# Patient Record
Sex: Male | Born: 1935 | Race: White | Hispanic: No | Smoking: Former smoker
Health system: Southern US, Community
[De-identification: ages and names within clinical notes are randomized; demographics above are authoritative.]

## PROBLEM LIST (undated history)

## (undated) DIAGNOSIS — R262 Difficulty in walking, not elsewhere classified: Secondary | ICD-10-CM

## (undated) DIAGNOSIS — I482 Chronic atrial fibrillation, unspecified: Secondary | ICD-10-CM

## (undated) DIAGNOSIS — G5600 Carpal tunnel syndrome, unspecified upper limb: Secondary | ICD-10-CM

## (undated) DIAGNOSIS — G459 Transient cerebral ischemic attack, unspecified: Secondary | ICD-10-CM

## (undated) DIAGNOSIS — N400 Enlarged prostate without lower urinary tract symptoms: Secondary | ICD-10-CM

## (undated) DIAGNOSIS — N189 Chronic kidney disease, unspecified: Secondary | ICD-10-CM

## (undated) DIAGNOSIS — I639 Cerebral infarction, unspecified: Secondary | ICD-10-CM

## (undated) DIAGNOSIS — Z66 Do not resuscitate: Secondary | ICD-10-CM

## (undated) DIAGNOSIS — M5030 Other cervical disc degeneration, unspecified cervical region: Secondary | ICD-10-CM

## (undated) DIAGNOSIS — R531 Weakness: Secondary | ICD-10-CM

## (undated) DIAGNOSIS — G35 Multiple sclerosis: Secondary | ICD-10-CM

## (undated) DIAGNOSIS — I499 Cardiac arrhythmia, unspecified: Secondary | ICD-10-CM

## (undated) DIAGNOSIS — M48061 Spinal stenosis, lumbar region without neurogenic claudication: Secondary | ICD-10-CM

## (undated) HISTORY — PX: INGUINAL HERNIA REPAIR: SUR1180

## (undated) HISTORY — DX: Carpal tunnel syndrome, unspecified upper limb: G56.00

## (undated) HISTORY — PX: CATARACT EXTRACTION W/ INTRAOCULAR LENS  IMPLANT, BILATERAL: SHX1307

## (undated) HISTORY — PX: PROCTOSIGMOIDOSCOPY: SUR1052

## (undated) HISTORY — DX: Transient cerebral ischemic attack, unspecified: G45.9

## (undated) HISTORY — DX: Chronic atrial fibrillation, unspecified: I48.20

---

## 2000-07-12 ENCOUNTER — Ambulatory Visit (HOSPITAL_COMMUNITY): Admission: RE | Admit: 2000-07-12 | Discharge: 2000-07-12 | Payer: Self-pay | Admitting: Gastroenterology

## 2003-07-17 ENCOUNTER — Encounter: Admission: RE | Admit: 2003-07-17 | Discharge: 2003-07-17 | Payer: Self-pay | Admitting: Cardiology

## 2003-07-18 ENCOUNTER — Ambulatory Visit (HOSPITAL_COMMUNITY): Admission: RE | Admit: 2003-07-18 | Discharge: 2003-07-18 | Payer: Self-pay | Admitting: Cardiology

## 2012-08-22 ENCOUNTER — Observation Stay (HOSPITAL_COMMUNITY)
Admission: EM | Admit: 2012-08-22 | Discharge: 2012-08-26 | Disposition: A | Discharge status: Home / Self Care | Payer: Medicare Other | Attending: Internal Medicine | Admitting: Internal Medicine

## 2012-08-22 ENCOUNTER — Emergency Department (HOSPITAL_COMMUNITY): Payer: Medicare Other

## 2012-08-22 DIAGNOSIS — R748 Abnormal levels of other serum enzymes: Secondary | ICD-10-CM | POA: Insufficient documentation

## 2012-08-22 DIAGNOSIS — R079 Chest pain, unspecified: Secondary | ICD-10-CM

## 2012-08-22 DIAGNOSIS — R29898 Other symptoms and signs involving the musculoskeletal system: Secondary | ICD-10-CM | POA: Insufficient documentation

## 2012-08-22 DIAGNOSIS — I639 Cerebral infarction, unspecified: Secondary | ICD-10-CM

## 2012-08-22 DIAGNOSIS — I4891 Unspecified atrial fibrillation: Secondary | ICD-10-CM

## 2012-08-22 DIAGNOSIS — G8190 Hemiplegia, unspecified affecting unspecified side: Secondary | ICD-10-CM | POA: Insufficient documentation

## 2012-08-22 DIAGNOSIS — G459 Transient cerebral ischemic attack, unspecified: Principal | ICD-10-CM | POA: Insufficient documentation

## 2012-08-22 DIAGNOSIS — I672 Cerebral atherosclerosis: Secondary | ICD-10-CM | POA: Insufficient documentation

## 2012-08-22 DIAGNOSIS — I482 Chronic atrial fibrillation, unspecified: Secondary | ICD-10-CM | POA: Diagnosis present

## 2012-08-22 HISTORY — DX: Cerebral infarction, unspecified: I63.9

## 2012-08-22 LAB — RAPID URINE DRUG SCREEN, HOSP PERFORMED
Amphetamines: NOT DETECTED
Barbiturates: NOT DETECTED
Benzodiazepines: NOT DETECTED
Cocaine: NOT DETECTED
Opiates: NOT DETECTED
Tetrahydrocannabinol: NOT DETECTED

## 2012-08-22 LAB — POCT I-STAT, CHEM 8
BUN: 25 mg/dL — ABNORMAL HIGH (ref 6–23)
Calcium, Ion: 1.08 mmol/L — ABNORMAL LOW (ref 1.13–1.30)
Chloride: 108 mEq/L (ref 96–112)
Glucose, Bld: 92 mg/dL (ref 70–99)
TCO2: 20 mmol/L (ref 0–100)

## 2012-08-22 LAB — DIGOXIN LEVEL: Digoxin Level: 0.8 ng/mL (ref 0.8–2.0)

## 2012-08-22 MED ORDER — METOPROLOL TARTRATE 1 MG/ML IV SOLN
5.0000 mg | Freq: Once | INTRAVENOUS | Status: AC
  Administered 2012-08-22: 5 mg via INTRAVENOUS

## 2012-08-22 MED ORDER — METOPROLOL TARTRATE 1 MG/ML IV SOLN
INTRAVENOUS | Status: AC
  Administered 2012-08-22: 5 mg via INTRAVENOUS
  Filled 2012-08-22: qty 5

## 2012-08-22 NOTE — ED Notes (Signed)
Code stroke cancelled per neurologist 

## 2012-08-22 NOTE — Consult Note (Signed)
Referring Physician: Dr. Laurice Record    Chief Complaint: Left-sided weakness  HPI: Arthur Speagle is an 78 y.o. male with a history of atrial fibrillation, not on anticoagulation, presenting with acute weakness involving left extremities. Onset of symptoms was at 5 PM today. Has no previous documentation of stroke nor TIA. CT scan showed an area of hypoattenuation involving the left frontal region indicative of probable old stroke. There were no acute changes. Patient's deficits improved in route to the emergency room. NIH stroke score following arrival was 2. He was noted TPA candidate because of rapidly resolving deficits and patient was beyond time window for treatment consideration with intravenous thrombolytic therapy. He's been taking aspirin 81 mg per day.  LSN: 5 PM today tPA Given: No: Rapidly resolving deficits and beyond time window for treatment consideration MRankin: 1  No past medical history on file.  No family history on file.   Medications:  Prior to Admission: Digoxin 0.125 mg per day Metoprolol 12.5 mg per day Aspirin 81 mg per day  Physical Examination: Blood pressure 109/58, pulse 100, temperature 98.4 F (36.9 C), temperature source Oral, resp. rate 20, SpO2 95.00%.  Neurologic Examination: Mental Status: Alert, oriented, thought content appropriate.  Speech fluent without evidence of aphasia. Able to follow commands without difficulty. Cranial Nerves: II-Visual fields were normal. III/IV/VI-Pupils were equal and reacted. Extraocular movements were full and conjugate.    V/VII-no facial numbness and no facial weakness. VIII-normal. X-normal speech and symmetrical palatal movement. Motor: Moderate proximal and distal weakness of the left lower extremity with flaccid muscle tone (chronic by history); moderately severe weakness of hand grip and with flexion contractures of digits (also chronic); normal motor exam otherwise.  Sensory: Normal throughout. Deep Tendon  Reflexes: 1+ and symmetric. Plantars: Mute bilaterally Cerebellar: Normal finger-to-nose testing. Carotid auscultation: Normal   Ct Head Wo Contrast  08/22/2012  *RADIOLOGY REPORT*  Clinical Data: History of fall and severe weakness on the left side.  Code stroke.  CT HEAD WITHOUT CONTRAST  Technique:  Contiguous axial images were obtained from the base of the skull through the vertex without contrast.  Comparison: No priors.  Findings: Focal area of low attenuation in the left frontal periventricular white matter (images 21 and 22 of series 2). Focus of low attenuation in the left putamen is compatible with an old lacunar infarction.  A similar finding is present in the inferior aspect of the right putamen.  Physiologic calcifications of the basal ganglia bilaterally.  Mild cerebral and cerebellar atrophy. No definite evidence of acute intracranial hemorrhage, mass, mass effect, hydrocephalus or abnormal intra or extra-axial fluid collections.  Visualized paranasal sinuses and mastoids are well pneumatized.  No acute displaced skull fractures are noted.  IMPRESSION: 1.  Slightly ill-defined focus of low attenuation in the left frontal lobe periventricular white matter could represent an area of age indeterminate ischemia. 2.  Mild cerebral and cerebellar atrophy with old bilateral basal ganglia lacunar infarctions.  These results were called by telephone on 08/22/2012 at 08:50 p.m. to Dr. Roseanne Reno, who verbally acknowledged these results.   Original Report Authenticated By: Trudie Reed, M.D.     Assessment: 78 y.o. male with known atrial fibrillation and not on anticoagulation, presenting with probable right subcortical MCA territory TIA. Acute small vessel stroke cannot be ruled out.  Stroke Risk Factors - atrial fibrillation  Plan: 1. HgbA1c, fasting lipid panel 2. MRI, MRA  of the brain without contrast 3. PT consult, OT consult, Speech consult 4. Echocardiogram 5. Carotid  dopplers 6.  Prophylactic therapy-Antiplatelet med: Plavix 75 mg per day 7. Risk factor modification 8. Telemetry monitoring   C.R. Roseanne Reno, MD [facility] [phone]  08/22/2012, 9:14 PM
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
## 2012-08-22 NOTE — ED Notes (Signed)
Placed on ccm showing atrial fib rate 120-156; EDP made aware of same - orders given; pt c/o interm pain to left chest - none at this time - describes pain as "a little bit of pain and dull"

## 2012-08-23 ENCOUNTER — Encounter (HOSPITAL_COMMUNITY): Payer: Self-pay | Admitting: Internal Medicine

## 2012-08-23 ENCOUNTER — Observation Stay (HOSPITAL_COMMUNITY): Payer: Medicare Other

## 2012-08-23 DIAGNOSIS — I4891 Unspecified atrial fibrillation: Secondary | ICD-10-CM

## 2012-08-23 DIAGNOSIS — R0989 Other specified symptoms and signs involving the circulatory and respiratory systems: Secondary | ICD-10-CM

## 2012-08-23 DIAGNOSIS — I6350 Cerebral infarction due to unspecified occlusion or stenosis of unspecified cerebral artery: Secondary | ICD-10-CM

## 2012-08-23 DIAGNOSIS — G459 Transient cerebral ischemic attack, unspecified: Secondary | ICD-10-CM

## 2012-08-23 LAB — CBC WITH DIFFERENTIAL/PLATELET
Basophils Absolute: 0 10*3/uL (ref 0.0–0.1)
Basophils Relative: 0 % (ref 0–1)
Eosinophils Absolute: 0 10*3/uL (ref 0.0–0.7)
Eosinophils Relative: 0 % (ref 0–5)
HCT: 36.3 % — ABNORMAL LOW (ref 39.0–52.0)
MCH: 38.1 pg — ABNORMAL HIGH (ref 26.0–34.0)
MCHC: 35.5 g/dL (ref 30.0–36.0)
MCV: 107.1 fL — ABNORMAL HIGH (ref 78.0–100.0)
Monocytes Absolute: 1.8 10*3/uL — ABNORMAL HIGH (ref 0.1–1.0)
RDW: 13.7 % (ref 11.5–15.5)

## 2012-08-23 LAB — CK TOTAL AND CKMB (NOT AT ARMC): Relative Index: 1.2 (ref 0.0–2.5)

## 2012-08-23 LAB — LIPID PANEL
Cholesterol: 136 mg/dL (ref 0–200)
LDL Cholesterol: 74 mg/dL (ref 0–99)
Total CHOL/HDL Ratio: 3 RATIO
Triglycerides: 55 mg/dL (ref <150)
VLDL: 11 mg/dL (ref 0–40)

## 2012-08-23 LAB — COMPREHENSIVE METABOLIC PANEL
AST: 30 U/L (ref 0–37)
Albumin: 3.1 g/dL — ABNORMAL LOW (ref 3.5–5.2)
CO2: 24 mEq/L (ref 19–32)
Calcium: 9.2 mg/dL (ref 8.4–10.5)
Creatinine, Ser: 0.86 mg/dL (ref 0.50–1.35)
GFR calc non Af Amer: 76 mL/min — ABNORMAL LOW (ref >90)
Total Protein: 6.7 g/dL (ref 6.0–8.3)

## 2012-08-23 LAB — APTT: aPTT: 37 seconds (ref 24–37)

## 2012-08-23 LAB — TROPONIN I
Troponin I: 0.42 ng/mL (ref <0.30)
Troponin I: <0.3 ng/mL (ref <0.30)
Troponin I: 0.35 ng/mL (ref <0.30)

## 2012-08-23 LAB — POCT I-STAT TROPONIN I

## 2012-08-23 LAB — PROTIME-INR: INR: 1.1 (ref 0.00–1.49)

## 2012-08-23 MED ORDER — HEPARIN SODIUM (PORCINE) 5000 UNIT/ML IJ SOLN
5000.0000 [IU] | Freq: Three times a day (TID) | INTRAMUSCULAR | Status: DC
  Administered 2012-08-23 – 2012-08-24 (×4): 5000 [IU] via SUBCUTANEOUS
  Filled 2012-08-23 (×8): qty 1

## 2012-08-23 MED ORDER — DIGOXIN 125 MCG PO TABS
0.1250 mg | ORAL_TABLET | Freq: Every day | ORAL | Status: DC
  Administered 2012-08-23 – 2012-08-26 (×4): 0.125 mg via ORAL
  Filled 2012-08-23 (×4): qty 1

## 2012-08-23 MED ORDER — METOPROLOL TARTRATE 12.5 MG HALF TABLET
12.5000 mg | ORAL_TABLET | Freq: Two times a day (BID) | ORAL | Status: DC
  Administered 2012-08-23 (×3): 12.5 mg via ORAL
  Filled 2012-08-23 (×5): qty 1

## 2012-08-23 MED ORDER — CLOPIDOGREL BISULFATE 75 MG PO TABS
75.0000 mg | ORAL_TABLET | Freq: Every day | ORAL | Status: DC
  Administered 2012-08-23 – 2012-08-24 (×2): 75 mg via ORAL
  Filled 2012-08-23 (×3): qty 1

## 2012-08-23 MED ORDER — ASPIRIN 81 MG PO CHEW
81.0000 mg | CHEWABLE_TABLET | Freq: Every day | ORAL | Status: DC
  Administered 2012-08-23 – 2012-08-24 (×2): 81 mg via ORAL
  Filled 2012-08-23 (×2): qty 1

## 2012-08-23 NOTE — Evaluation (Signed)
Speech Language Pathology Evaluation Patient Details Name: Erik Arnold MRN: [id number] DOB: 1924/07/15 Today's Date: 08/23/2012 Time: 1045-1100 SLP Time Calculation (min): 15 min  Problem List:  Patient Active Problem List  Diagnosis  . TIA (transient ischemic attack)  . Atrial fibrillation  . Cardiac enzymes elevated  . Chest pain   Past Medical History:  Past Medical History  Diagnosis Date  . AF (atrial fibrillation)     Not on coumadin as he was deemed a fall risk after he fell in his cardiologists office.   Past Surgical History: No past surgical history on file. HPI:   78 y.o. male with known atrial fibrillation and not on anticoagulation, presenting with probable right subcortical MCA territory TIA. Acute small vessel stroke cannot be ruled out.   Assessment / Plan / Recommendation Clinical Impression  Cognitive-linguistic evaluation revealed normal function with no SLP intervention required.  Pt agrees with results/recs.    SLP Assessment  Patient does not need any further Speech Language Pathology Services    Follow Up Recommendations  None    Frequency and Duration   n/a     Pertinent Vitals/Pain No pain   SLP Goals     SLP Evaluation Prior Functioning  Cognitive/Linguistic Baseline: Within functional limits Type of Home: House Lives With: Alone Vocation: Retired (from [employer])   Cognition  Overall Cognitive Status: Appears within functional limits for tasks assessed Arousal/Alertness: Awake/alert Orientation Level: Oriented X4    Comprehension  Auditory Comprehension Overall Auditory Comprehension: Appears within functional limits for tasks assessed Visual Recognition/Discrimination Discrimination: Within Function Limits Reading Comprehension Reading Status: Within funtional limits    Expression Expression Primary Mode of Expression: Verbal Verbal Expression Overall Verbal Expression: Appears within functional limits for tasks assessed    Oral / Motor Oral Motor/Sensory Function Overall Oral Motor/Sensory Function: Appears within functional limits for tasks assessed Motor Speech Overall Motor Speech: Appears within functional limits for tasks assessed   GO Functional Assessment Tool Used: clinical judgement Functional Limitations: Spoken language comprehension Spoken Language Comprehension Current Status [phone]): 0 percent impaired, limited or restricted Spoken Language Comprehension Goal Status (G9160): 0 percent impaired, limited or restricted Spoken Language Comprehension Discharge Status [phone]): 0 percent impaired, limited or restricted  Erik Arnold, [place] CCC/SLP Pager [phone]   [name] 08/23/2012, 11:03 AM

## 2012-08-23 NOTE — Progress Notes (Signed)
UR completed 

## 2012-08-23 NOTE — ED Notes (Signed)
Notified EDP of critical lab values.  Admitting doctor also paged, have not received a page back.

## 2012-08-23 NOTE — Evaluation (Signed)
Occupational Therapy Evaluation Patient Details Name: Mark Hassey MRN: [id number] DOB: 1924-08-20 Today's Date: 08/23/2012 Time: 4098-1191 OT Time Calculation (min): 18 min  OT Assessment / Plan / Recommendation Clinical Impression  This 78 yo male admitted s/p fall with left sided weakness with CT indeterminate (awaiting MRI) presents to acute OT with problems below. Will benefit from acute OT with follow OT at SNF.    OT Assessment  Patient needs continued OT Services    Follow Up Recommendations  SNF    Barriers to Discharge Decreased caregiver support    Equipment Recommendations  3 in 1 bedside comode       Frequency  Min 3X/week    Precautions / Restrictions Precautions Precautions: Fall Restrictions Weight Bearing Restrictions: No       ADL  Eating/Feeding: Simulated;Set up Where Assessed - Eating/Feeding: Chair Grooming: Simulated;Minimal assistance Where Assessed - Grooming: Unsupported sitting Upper Body Bathing: Simulated;Minimal assistance Where Assessed - Upper Body Bathing: Unsupported sitting Lower Body Bathing: Simulated;Moderate assistance Where Assessed - Lower Body Bathing: Supported sit to stand Upper Body Dressing: Simulated;Moderate assistance Where Assessed - Upper Body Dressing: Unsupported sitting Lower Body Dressing: Simulated;Maximal assistance Where Assessed - Lower Body Dressing: Supported sit to stand Toilet Transfer: Simulated;Minimal assistance Toilet Transfer Method: Sit to Barista:  (Bed ambulating 5 feet to recliner) Toileting - Clothing Manipulation and Hygiene: Simulated;Minimal assistance Where Assessed - Engineer, mining and Hygiene: Standing Equipment Used: Rolling walker;Gait belt Transfers/Ambulation Related to ADLs: Min A for all    OT Diagnosis: Generalized weakness;Hemiplegia non-dominant side  OT Problem List: Decreased strength;Decreased range of motion;Impaired balance (sitting  and/or standing);Decreased coordination;Decreased knowledge of use of DME or AE;Impaired UE functional use OT Treatment Interventions: Self-care/ADL training;DME and/or AE instruction;Patient/family education;Balance training;Therapeutic exercise;Therapeutic activities   OT Goals Acute Rehab OT Goals OT Goal Formulation: With patient Time For Goal Achievement: 09/06/12 Potential to Achieve Goals: Good ADL Goals Pt Will Perform Grooming: Unsupported;with set-up;Standing at sink (min guard A, 1 task, using LUE as a gross A) ADL Goal: Grooming - Progress: Goal set today Pt Will Perform Upper Body Bathing: with set-up;with supervision;Unsupported;Sitting, edge of bed;Sitting, chair ADL Goal: Upper Body Bathing - Progress: Goal set today Pt Will Perform Lower Body Bathing: with min assist;Unsupported;Sit to stand from bed;Sit to stand from chair ADL Goal: Lower Body Bathing - Progress: Goal set today Pt Will Perform Upper Body Dressing: with min assist;Unsupported;Sit to stand from bed;Sit to stand from chair ADL Goal: Upper Body Dressing - Progress: Goal set today Pt Will Perform Lower Body Dressing: with mod assist;Unsupported;Sit to stand from bed;Sit to stand from chair ADL Goal: Lower Body Dressing - Progress: Goal set today Pt Will Transfer to Toilet: Ambulation;with DME;Comfort height toilet;Grab bars (min guard A) ADL Goal: Toilet Transfer - Progress: Goal set today Pt Will Perform Toileting - Clothing Manipulation: Standing (min guard A) ADL Goal: Toileting - Clothing Manipulation - Progress: Goal set today Pt Will Perform Toileting - Hygiene: Sit to stand from 3-in-1/toilet (min guard A) ADL Goal: Toileting - Hygiene - Progress: Goal set today Miscellaneous OT Goals Miscellaneous OT Goal #1: Pt will be Supervision for LUE GM/FM activities and exercises. OT Goal: Miscellaneous Goal #1 - Progress: Goal set today  Visit Information  Last OT Received On: 08/23/12 Assistance Needed:  +1 PT/OT Co-Evaluation/Treatment: Yes    Subjective Data  Subjective: I think the therapy would be good and if I like the place I may just stay there.  Prior Functioning     Home Living Lives With: Alone Available Help at Discharge: Family;Available PRN/intermittently Type of Home: House Home Access: Stairs to enter [employer] of Steps: 1 Entrance Stairs-Rails: None Home Layout: One level Bathroom Shower/Tub: Walk-in shower;Door [employer] Toilet: Handicapped height Bathroom Accessibility: Yes How Accessible: Accessible via walker Home Adaptive Equipment: Hand-held shower hose;Grab bars in shower;Straight cane Prior Function Level of Independence: Independent with assistive device(s) (SPC) Able to Take Stairs?: No Driving: Yes Vocation: Retired Musician: No difficulties Dominant Hand: Right            Cognition  Overall Cognitive Status: Appears within functional limits for tasks assessed/performed Arousal/Alertness: Awake/alert Orientation Level: Appears intact for tasks assessed Behavior During Session: [facility] for tasks performed    Extremity/Trunk Assessment Right Upper Extremity Assessment RUE ROM/Strength/Tone: Deficits RUE ROM/Strength/Tone Deficits: Decreased AROM at shoulder, rest WFL Left Upper Extremity Assessment LUE ROM/Strength/Tone: Deficits LUE ROM/Strength/Tone Deficits: Decreased AROM at shoulder, WFL elbow flexion, decreased strength in triceps for extension. Decreased wrist and digit flex/ext. LUE Coordination: Deficits LUE Coordination Deficits: Both are affected (Fm > GM)     Mobility Bed Mobility Bed Mobility: Supine to Sit;Sitting - Scoot to Edge of Bed Supine to Sit: 5: Supervision;HOB flat Sitting - Scoot to Edge of Bed: 5: Supervision Details for Bed Mobility Assistance: Increased time Transfers Transfers: Sit to Stand;Stand to Sit Sit to Stand: 4: Min assist;With upper extremity assist;From bed Stand  to Sit: 4: Min assist;With upper extremity assist;With armrests;To chair/3-in-1 Details for Transfer Assistance: VCs for safe hand placement              End of Session OT - End of Session Equipment Utilized During Treatment: Gait belt (RW) Activity Tolerance: Patient tolerated treatment well Patient left: in chair;with call bell/phone within reach Nurse Communication: Mobility status (+1 with RW, need to secure his wallet and meds in room)       Evette Georges [phone] 08/23/2012, 11:55 AM

## 2012-08-23 NOTE — Consult Note (Signed)
Admit date: 08/22/2012 Referring Physician  Dr. Susie Cassette Primary Cardiologist  Dr. Mayford Knife Reason for Consultation  Elevated cardiac enzymes  HPI:  Erik Arnold is a 78 y.o. male w h/o A.Fib not on coumadin due to concern for fall risk (fell in his cardiologist's office), who presented to the ED with weakness located in his LUE and LLE. Symptoms onset at 5 PM, no previous h/o CVA nor TIA. Code stroke was called on arrival. In the ED NIH stroke scale was 2, but the patient showed rapidly resolving deficits and was deemed not a candidate for TPA (was beyond the time window as well). CT scan demonstrated area of hypo attenuation in the L frontal region indicating a probable old stroke but no acute changes. He was admitted and was noted to have elevated cardiac enzymes and we are now asked to consult.  The patient states that he has had left sided chest pain off and on for several weeks that he attributed to indigestion.  He denies any chest pain on the day of admission.  He denies any SOB or LE edema.  CPK and MB were elevated but relative index is normal.  Troponin is mildly elevated.     PMH:   Past Medical History  Diagnosis Date  . AF (atrial fibrillation)     Not on coumadin as he was deemed a fall risk after he fell in his cardiologists office.     PSH:  No past surgical history on file.  Allergies:  Review of patient's allergies indicates no known allergies. Prior to Admit Meds:   Prescriptions prior to admission  Medication Sig Dispense Refill  . digoxin (LANOXIN) 0.125 MG tablet Take 0.125 mg by mouth daily.      . metoprolol tartrate (LOPRESSOR) 25 MG tablet Take 12.5 mg by mouth 2 (two) times daily.       Fam HX:   No family history on file. Social HX:    History   Social History  . Marital Status: Unknown    Spouse Name: N/A    Number of Children: N/A  . Years of Education: N/A   Occupational History  . Not on file.   Social History Main Topics  . Smoking status: Not on file   . Smokeless tobacco: Not on file  . Alcohol Use: Not on file  . Drug Use: Not on file  . Sexually Active: Not on file   Other Topics Concern  . Not on file   Social History Narrative  . No narrative on file     ROS:  All 11 ROS were addressed and are negative except what is stated in the HPI  Physical Exam: Blood pressure 145/42, pulse 102, temperature 98 F (36.7 C), temperature source Oral, resp. rate 20, height 5\' 8"  (1.727 m), weight 76.2 kg (167 lb 15.9 oz), SpO2 99.00%.    General: Well developed, well nourished, in no acute distress Head: Eyes PERRLA, No xanthomas.   Normal cephalic and atramatic  Lungs:   Clear bilaterally to auscultation and percussion. Heart:   HRRR S1 S2 Pulses are 2+ & equal.            No carotid bruit. No JVD.  No abdominal bruits. No femoral bruits. Abdomen: Bowel sounds are positive, abdomen soft and non-tender without masses  Extremities:   No clubbing, cyanosis or edema.  DP +1 Neuro: Alert and oriented X 3. Psych:  Good affect, responds appropriately    Labs:   Lab Results  Component Value Date   WBC 14.6* 08/23/2012   HGB 12.9* 08/23/2012   HCT 36.3* 08/23/2012   MCV 107.1* 08/23/2012   PLT 193 08/23/2012    Lab 08/23/12 0022  NA 133*  K 4.5  CL 100  CO2 24  BUN 24*  CREATININE 0.86  CALCIUM 9.2  PROT 6.7  BILITOT 0.7  ALKPHOS 60  ALT 13  AST 30  GLUCOSE 90   No results found for this basename: PTT   Lab Results  Component Value Date   INR 1.10 08/23/2012   Lab Results  Component Value Date   CKTOTAL 712* 08/23/2012   CKMB 8.7* 08/23/2012   TROPONINI 0.40* 08/23/2012     Lab Results  Component Value Date   CHOL 136 08/23/2012   Lab Results  Component Value Date   HDL 46 08/23/2012   Lab Results  Component Value Date   LDLCALC 79 08/23/2012   Lab Results  Component Value Date   TRIG 56 08/23/2012   Lab Results  Component Value Date   CHOLHDL 3.0 08/23/2012   No results found for this  basename: LDLDIRECT      Radiology:  Ct Head Wo Contrast  08/22/2012  *RADIOLOGY REPORT*  Clinical Data: History of fall and severe weakness on the left side.  Code stroke.  CT HEAD WITHOUT CONTRAST  Technique:  Contiguous axial images were obtained from the base of the skull through the vertex without contrast.  Comparison: No priors.  Findings: Focal area of low attenuation in the left frontal periventricular white matter (images 21 and 22 of series 2). Focus of low attenuation in the left putamen is compatible with an old lacunar infarction.  A similar finding is present in the inferior aspect of the right putamen.  Physiologic calcifications of the basal ganglia bilaterally.  Mild cerebral and cerebellar atrophy. No definite evidence of acute intracranial hemorrhage, mass, mass effect, hydrocephalus or abnormal intra or extra-axial fluid collections.  Visualized paranasal sinuses and mastoids are well pneumatized.  No acute displaced skull fractures are noted.  IMPRESSION: 1.  Slightly ill-defined focus of low attenuation in the left frontal lobe periventricular white matter could represent an area of age indeterminate ischemia. 2.  Mild cerebral and cerebellar atrophy with old bilateral basal ganglia lacunar infarctions.  These results were called by telephone on 08/22/2012 at 08:50 p.m. to Dr. Roseanne Reno, who verbally acknowledged these results.   Original Report Authenticated By: Trudie Reed, M.D.     EKG:    ASSESSMENT:  1.  Elevated cardiac enzymes of ? Etiology.  His CPK and MB are elevated but the relative index is normal.  The Troponin is only mildly elevated.  This may be secondary to TIA/CVA.  With a relative index the MB elevation does not appear to be cardiac in origin.  He does give a history of some intermittent chest discomfort over the past few weeks but attributed it to indigestion. 2.  TIA vs. CVA for MRI today 3.  Chronic atrial fibrillation - not a coumadin candidate secondary  to fall risk - HR increased today  PLAN:   1.  2D echo to evaluate for wall motion abnormalities 2.  Continue to cycle cardiac enzymes 3.  IV Heparin gtt until all cardiac enzymes are back if ok with Neurology 4.  Continue Plavix/ASA/dig 5.  Lopressor started for HR control  Quintella Reichert, MD  08/23/2012  9:28 AM

## 2012-08-23 NOTE — Progress Notes (Signed)
Stroke Team Progress Note  HISTORY Erik Arnold is an 78 y.o. male with a history of atrial fibrillation, not on anticoagulation, presenting with acute weakness involving left extremities. Onset of symptoms was at 5 PM 08/22/2012. Has no previous documentation of stroke nor TIA. CT scan showed an area of hypoattenuation involving the left frontal region indicative of probable old stroke. There were no acute changes. Patient's deficits improved in route to the emergency room. NIH stroke score following arrival was 2. He was noted TPA candidate because of rapidly resolving deficits and patient was beyond time window for treatment consideration with intravenous thrombolytic therapy. He's been taking aspirin 81 mg per day. Patient was not a TPA candidate secondary to Rapidly resolving deficits and beyond time window for treatment consideration. He was admitted for further evaluation and treatment.  SUBJECTIVE No family is at the bedside.  Overall he feels his condition is gradually improving. His left sided weakness "could still be a little better". Ambulated with cane prior to admission. Lives alone.  OBJECTIVE Most recent Vital Signs: Filed Vitals:   08/23/12 0330 08/23/12 0354 08/23/12 0530 08/23/12 0730  BP: 105/61  121/59 145/42  Pulse: 101  110 102  Temp: 98.2 F (36.8 C)  98.3 F (36.8 C) 98 F (36.7 C)  TempSrc:    Oral  Resp: 22  22 20   Height:  5\' 8"  (1.727 m)    Weight:  76.2 kg (167 lb 15.9 oz)    SpO2: 97%  94% 99%   IV Fluid Intake:     MEDICATIONS    . aspirin  81 mg Oral Daily  . clopidogrel  75 mg Oral Q breakfast  . digoxin  0.125 mg Oral Daily  . heparin  5,000 Units Subcutaneous Q8H  . metoprolol tartrate  12.5 mg Oral BID   PRN:    Diet:  Cardiac thin liquids Activity:   DVT Prophylaxis:  Heparin 5000 units sq tid  CLINICALLY SIGNIFICANT STUDIES Basic Metabolic Panel:  Lab 08/23/12 1610 08/22/12 2104  NA 133* 137  K 4.5 4.3  CL 100 108  CO2 24 --  GLUCOSE  90 92  BUN 24* 25*  CREATININE 0.86 0.80  CALCIUM 9.2 --  MG -- --  PHOS -- --   Liver Function Tests:  Lab 08/23/12 0022  AST 30  ALT 13  ALKPHOS 60  BILITOT 0.7  PROT 6.7  ALBUMIN 3.1*   CBC:  Lab 08/23/12 0022 08/22/12 2104  WBC 14.6* --  NEUTROABS 11.3* --  HGB 12.9* 12.6*  HCT 36.3* 37.0*  MCV 107.1* --  PLT 193 --   Coagulation:  Lab 08/23/12 0022  LABPROT 14.1  INR 1.10   Cardiac Enzymes:  Lab 08/23/12 0022  CKTOTAL 712*  CKMB 8.7*  CKMBINDEX --  TROPONINI 0.40*   Urinalysis: No results found for this basename: COLORURINE:2,APPERANCEUR:2,LABSPEC:2,PHURINE:2,GLUCOSEU:2,HGBUR:2,BILIRUBINUR:2,KETONESUR:2,PROTEINUR:2,UROBILINOGEN:2,NITRITE:2,LEUKOCYTESUR:2 in the last 168 hours Lipid Panel No results found for this basename: chol, trig, hdl, cholhdl, vldl, ldlcalc   HgbA1C  No results found for this basename: HGBA1C    Urine Drug Screen:     Component Value Date/Time   LABOPIA NONE DETECTED 08/22/2012 2230   COCAINSCRNUR NONE DETECTED 08/22/2012 2230   LABBENZ NONE DETECTED 08/22/2012 2230   AMPHETMU NONE DETECTED 08/22/2012 2230   THCU NONE DETECTED 08/22/2012 2230   LABBARB NONE DETECTED 08/22/2012 2230    Alcohol Level: No results found for this basename: ETH:2 in the last 168 hours  CT of the brain  08/22/2012  1.  Slightly ill-defined focus of low attenuation in the left frontal lobe periventricular white matter could represent an area of age indeterminate ischemia. 2.  Mild cerebral and cerebellar atrophy with old bilateral basal ganglia lacunar infarctions.  MRI of the brain    MRA of the brain    2D Echocardiogram    Carotid Doppler    CXR    EKG     Therapy Recommendations   Physical Exam   GENERAL EXAM: Patient is in no distress  CARDIOVASCULAR: Regular rate and rhythm, no murmurs, no carotid bruits  NEUROLOGIC: MENTAL STATUS: awake, alert, language fluent, comprehension intact, naming intact CRANIAL NERVE: pupils equal  and reactive to light, visual fields full to confrontation, extraocular muscles intact, no nystagmus, facial sensation and strength symmetric, uvula midline, shoulder shrug symmetric, tongue midline. MOTOR: LUE AND LLE (3-4/5).  SENSORY: normal and symmetric to light touch COORDINATION: DECR IN LUE TO FFM. REFLEXES: deep tendon reflexes TRACE and symmetric   ASSESSMENT Erik Arnold is a 78 y.o. male presenting with left sided weakness. Suspect right brain stroke. Imaging pending. . Infarct felt to be embolic secondary to atrial fibrillation.  Has not been a coumadin candidate in the past due to risk of fall. Work up underway. On aspirin 81 mg orally every day prior to admission. Now on aspirin 81 mg orally every day and clopidogrel 75 mg orally every day for secondary stroke prevention. Patient with resultant left hemiparesis arm and leg.  atrial fibrillation, not a coumadin candidate secondary to fall risk. New lopressor for HR control Elevated cardiac enzymes. Dr. Mayford Knife recommends IV heparin if ok with neuro until cardiac enzymes resulted  Hospital day # 1  TREATMENT/PLAN  Continue aspirin 81 mg orally every day and clopidogrel 75 mg orally every day for secondary stroke prevention for now. May need to consider anticoagulant given new stroke  F/u lipids, A1c, cardiac enzymes  F/u MRI, MRA, 2D, Carotid doppler  Need to review MRI prior to consideration of IV heparin  Annie Main, MSN, RN, ANVP-BC, ANP-BC, GNP-BC [facility] Pager: [phone] 08/23/2012 9:06 AM  I have personally obtained a history, examined the patient, evaluated imaging results, and formulated the assessment and plan of care. I agree with the above.   [facility] - Stroke Team Joycelyn Schmid, MD 08/23/2012 9:06 AM  Please refer to [email or web address] for on-call Stroke MD

## 2012-08-23 NOTE — Progress Notes (Signed)
VASCULAR LAB PRELIMINARY  PRELIMINARY  PRELIMINARY  PRELIMINARY  Carotid duplex  completed.    Preliminary report:  Carotid duplex demonstrates minimal plaque with out ICA stenosis bilaterally.  Vertebral artery flow antegrade.  Erik Arnold, RVT 08/23/2012, 12:19 PM   .

## 2012-08-23 NOTE — Progress Notes (Signed)
  Echocardiogram 2D Echocardiogram has been performed.  Jocelin Schuelke FRANCES 08/23/2012, 12:18 PM

## 2012-08-23 NOTE — Progress Notes (Signed)
[facility] PROGRESS NOTE  Erik Arnold WUJ:[id number] DOB: [birth year] DOA: 08/22/2012 PCP: Quintella Reichert, MD  Assessment/Plan: Active Problems:  TIA (transient ischemic attack)  Atrial fibrillation    Stroke / TIA - feel that although his symptoms are improving this probably will be a stroke as he does still even now at 3AM have some neurologic deficits with fine motor skills of his LUE. On stroke pathway. ASA 81 + plavix for stroke prevention, performing work up but likely source is A.Fib. 2-D echo, carotid Doppler, MRA and MRI pending  A.Fib - not on coumadin due to fall risk, will defer decision on future coumadin to his cardiologist. Continue digoxin and Lopressor Dr. Mayford Knife has been consulted  Abnormal troponin, chest pain free, cardiology has been consulted, 2-D echo pending   Code Status: full Family Communication: family updated about patient's clinical progress Disposition Plan:  As above    Brief narrative: Erik Arnold is an 78 y.o. male with a history of atrial fibrillation, not on anticoagulation, presenting with acute weakness involving left extremities. Onset of symptoms was at 5 PM . Has no previous documentation of stroke nor TIA. CT scan showed an area of hypoattenuation involving the left frontal region indicative of probable old stroke. There were no acute changes. Patient's deficits improved in route to the emergency room. NIH stroke score following arrival was 2. He was noted TPA candidate because of rapidly resolving deficits and patient was beyond time window for treatment consideration with intravenous thrombolytic therapy. He's been taking aspirin 81 mg per day.   Consultants: Noel Christmas   Procedures:     Antibiotics:     HPI/Subjective: No chest pain,  Objective: Filed Vitals:   08/23/12 0245 08/23/12 0330 08/23/12 0354 08/23/12 0530  BP: 119/46 105/61  121/59  Pulse: 116 101  110  Temp:  98.2 F (36.8 C)  98.3 F (36.8 C)   TempSrc:      Resp: 34 22  22  Height:   5\' 8"  (1.727 m)   Weight:   76.2 kg (167 lb 15.9 oz)   SpO2: 97% 97%  94%   No intake or output data in the 24 hours ending 08/23/12 0728  Exam: General: NAD, resting comfortably in bed  Eyes: PEERLA EOMI  ENT: mucous membranes moist  Neck: supple w/o JVD  Cardiovascular: RRR w/o MRG  Respiratory: CTA B  Abdomen: soft, nt, nd, bs+  Skin: no rash nor lesion  Musculoskeletal: MAE, full ROM all 4 extremities  Psychiatric: normal tone and affect  Neurologic: AAOx3, weakness of LUE noted hand grip and fine motor deficits    Data Reviewed: Basic Metabolic Panel:  Lab 08/23/12 9562 08/22/12 2104  NA 133* 137  K 4.5 4.3  CL 100 108  CO2 24 --  GLUCOSE 90 92  BUN 24* 25*  CREATININE 0.86 0.80  CALCIUM 9.2 --  MG -- --  PHOS -- --    Liver Function Tests:  Lab 08/23/12 0022  AST 30  ALT 13  ALKPHOS 60  BILITOT 0.7  PROT 6.7  ALBUMIN 3.1*   No results found for this basename: LIPASE:5,AMYLASE:5 in the last 168 hours No results found for this basename: AMMONIA:5 in the last 168 hours  CBC:  Lab 08/23/12 0022 08/22/12 2104  WBC 14.6* --  NEUTROABS 11.3* --  HGB 12.9* 12.6*  HCT 36.3* 37.0*  MCV 107.1* --  PLT 193 --    Cardiac Enzymes:  Lab 08/23/12 0022  CKTOTAL 712*  CKMB 8.7*  CKMBINDEX --  TROPONINI 0.40*   BNP (last 3 results) No results found for this basename: PROBNP:3 in the last 8760 hours   CBG: No results found for this basename: GLUCAP:5 in the last 168 hours  No results found for this or any previous visit (from the past 240 hour(s)).   Studies: Ct Head Wo Contrast  08/22/2012  *RADIOLOGY REPORT*  Clinical Data: History of fall and severe weakness on the left side.  Code stroke.  CT HEAD WITHOUT CONTRAST  Technique:  Contiguous axial images were obtained from the base of the skull through the vertex without contrast.  Comparison: No priors.  Findings: Focal area of low attenuation in the left  frontal periventricular white matter (images 21 and 22 of series 2). Focus of low attenuation in the left putamen is compatible with an old lacunar infarction.  A similar finding is present in the inferior aspect of the right putamen.  Physiologic calcifications of the basal ganglia bilaterally.  Mild cerebral and cerebellar atrophy. No definite evidence of acute intracranial hemorrhage, mass, mass effect, hydrocephalus or abnormal intra or extra-axial fluid collections.  Visualized paranasal sinuses and mastoids are well pneumatized.  No acute displaced skull fractures are noted.  IMPRESSION: 1.  Slightly ill-defined focus of low attenuation in the left frontal lobe periventricular white matter could represent an area of age indeterminate ischemia. 2.  Mild cerebral and cerebellar atrophy with old bilateral basal ganglia lacunar infarctions.  These results were called by telephone on 08/22/2012 at 08:50 p.m. to Dr. Roseanne Reno, who verbally acknowledged these results.   Original Report Authenticated By: Trudie Reed, M.D.     Scheduled Meds:   . aspirin  81 mg Oral Daily  . clopidogrel  75 mg Oral Q breakfast  . digoxin  0.125 mg Oral Daily  . heparin  5,000 Units Subcutaneous Q8H  . metoprolol tartrate  12.5 mg Oral BID   Continuous Infusions:   Active Problems:  TIA (transient ischemic attack)  Atrial fibrillation    Time spent: 40 minutes   [facility]  [facility] Pager [phone]. If 8PM-8AM, please contact night-coverage at [email or web address], password [facility] 08/23/2012, 7:28 AM  LOS: 1 day

## 2012-08-23 NOTE — Progress Notes (Signed)
Patient Tropin level is 0.35, Dr. Was page.

## 2012-08-23 NOTE — H&P (Signed)
[facility] History and Physical  Erik Arnold ZOX:[id number] DOB: July 19, 1924 DOA: 08/22/2012  Referring physician: ED PCP: Quintella Reichert, MD  Specialists: Dr. Roseanne Reno of neurology  Chief Complaint: L sided weakness  HPI: Erik Arnold is a 78 y.o. male w h/o A.Fib not on coumadin due to concern for fall risk (fell in his cardiologist's office), who presented to the ED with weakness located in his LUE and LLE.  Symptoms onset at 5 PM, no previous h/o CVA nor TIA.  Code stroke was called on arrival.  In the ED NIH stroke scale was 2, but the patient thankfully showed rapidly resolving deficits and was deemed not a candidate for TPA (was beyond the time window as well).  CT scan demonstrated area of hypo attenuation in the L frontal region indicating a probable old stroke but no acute changes.    Review of Systems: 12 systems reviewed and otherwise negative.  Past Medical History  Diagnosis Date  . AF (atrial fibrillation)     Not on coumadin as he was deemed a fall risk after he fell in his cardiologists office.   No past surgical history on file. Social History:  does not have a smoking history on file. He does not have any smokeless tobacco history on file. His alcohol and drug histories not on file.   No Known Allergies  No family history on file.  Patient states hes "outlived everyone else" in his family.  Prior to Admission medications   Medication Sig Start Date End Date Taking? Authorizing Provider  digoxin (LANOXIN) 0.125 MG tablet Take 0.125 mg by mouth daily.   Yes Historical Provider, MD  metoprolol tartrate (LOPRESSOR) 25 MG tablet Take 12.5 mg by mouth 2 (two) times daily.   Yes Historical Provider, MD   Physical Exam: Filed Vitals:   08/22/12 2230 08/22/12 2330 08/23/12 0000 08/23/12 0010  BP: 129/108 107/61 92/56 109/70  Pulse: 108 98 102 111  Temp:      TempSrc:      Resp: 24 22 35 29  SpO2: 99% 98% 97% 95%    General:  NAD, resting comfortably in  bed Eyes: PEERLA EOMI ENT: mucous membranes moist Neck: supple w/o JVD Cardiovascular: RRR w/o MRG Respiratory: CTA B Abdomen: soft, nt, nd, bs+ Skin: no rash nor lesion Musculoskeletal: MAE, full ROM all 4 extremities Psychiatric: normal tone and affect Neurologic: AAOx3, weakness of LUE noted hand grip and fine motor deficits  Labs on Admission:  Basic Metabolic Panel:  Lab 08/23/12 8119 08/22/12 2104  NA 133* 137  K 4.5 4.3  CL 100 108  CO2 24 --  GLUCOSE 90 92  BUN 24* 25*  CREATININE 0.86 0.80  CALCIUM 9.2 --  MG -- --  PHOS -- --   Liver Function Tests:  Lab 08/23/12 0022  AST 30  ALT 13  ALKPHOS 60  BILITOT 0.7  PROT 6.7  ALBUMIN 3.1*   No results found for this basename: LIPASE:5,AMYLASE:5 in the last 168 hours No results found for this basename: AMMONIA:5 in the last 168 hours CBC:  Lab 08/23/12 0022 08/22/12 2104  WBC 14.6* --  NEUTROABS 11.3* --  HGB 12.9* 12.6*  HCT 36.3* 37.0*  MCV 107.1* --  PLT 193 --   Cardiac Enzymes:  Lab 08/23/12 0022  CKTOTAL 712*  CKMB 8.7*  CKMBINDEX --  TROPONINI 0.40*    BNP (last 3 results) No results found for this basename: PROBNP:3 in the last 8760 hours CBG: No results found  for this basename: GLUCAP:5 in the last 168 hours  Radiological Exams on Admission: Ct Head Wo Contrast  08/22/2012  *RADIOLOGY REPORT*  Clinical Data: History of fall and severe weakness on the left side.  Code stroke.  CT HEAD WITHOUT CONTRAST  Technique:  Contiguous axial images were obtained from the base of the skull through the vertex without contrast.  Comparison: No priors.  Findings: Focal area of low attenuation in the left frontal periventricular white matter (images 21 and 22 of series 2). Focus of low attenuation in the left putamen is compatible with an old lacunar infarction.  A similar finding is present in the inferior aspect of the right putamen.  Physiologic calcifications of the basal ganglia bilaterally.  Mild  cerebral and cerebellar atrophy. No definite evidence of acute intracranial hemorrhage, mass, mass effect, hydrocephalus or abnormal intra or extra-axial fluid collections.  Visualized paranasal sinuses and mastoids are well pneumatized.  No acute displaced skull fractures are noted.  IMPRESSION: 1.  Slightly ill-defined focus of low attenuation in the left frontal lobe periventricular white matter could represent an area of age indeterminate ischemia. 2.  Mild cerebral and cerebellar atrophy with old bilateral basal ganglia lacunar infarctions.  These results were called by telephone on 08/22/2012 at 08:50 p.m. to Dr. Roseanne Reno, who verbally acknowledged these results.   Original Report Authenticated By: Trudie Reed, M.D.     EKG: Independently reviewed.  Assessment/Plan Active Problems:  TIA (transient ischemic attack)  Atrial fibrillation   1. Stroke / TIA - feel that although his symptoms are improving this probably will be a stroke as he does still even now at 3AM have some neurologic deficits with fine motor skills of his LUE.  On stroke pathway.  ASA 81 + plavix for stroke prevention, performing work up but likely source is A.Fib. 2. A.Fib - not on coumadin due to fall risk, will defer decision on future coumadin to his cardiologist.  Dr. Roseanne Reno has already seen the patient and his recommendations are in the chart.  Code Status: Full Code (must indicate code status--if unknown or must be presumed, indicate so) Family Communication: no family in room (indicate person spoken with, if applicable, with phone number if by telephone) Disposition Plan: admit to obs (indicate anticipated LOS)  Time spent: 70 min  GARDNER, JARED M. [facility] Pager [phone]  If 7PM-7AM, please contact night-coverage [email or web address] Password [facility] 08/23/2012, 2:37 AM

## 2012-08-23 NOTE — Evaluation (Signed)
Physical Therapy Evaluation Patient Details Name: Erik Arnold MRN: [id number] DOB: May 03, 1924 Today's Date: 08/23/2012 Time: 4098-1191 PT Time Calculation (min): 18 min  PT Assessment / Plan / Recommendation Clinical Impression  Pt is a very pleasent 78 y.o. male who presents with deficits in functional mobility secondary to weakness (left side > right); poor coordination; decreased activity tolerance and immobility.  Pt will benefit from continued skilled PT to address deficits.  Recommend continued skilled PT upon discharge at SNF.    PT Assessment  Patient needs continued PT services    Follow Up Recommendations  SNF    Does the patient have the potential to tolerate intense rehabilitation      Barriers to Discharge Decreased caregiver support lives alone    Equipment Recommendations  Rolling walker with 5" wheels       Frequency Min 3X/week    Precautions / Restrictions Precautions Precautions: Fall Restrictions Weight Bearing Restrictions: No         Mobility  Bed Mobility Bed Mobility: Supine to Sit;Sitting - Scoot to Edge of Bed Supine to Sit: 5: Supervision;HOB flat Sitting - Scoot to Edge of Bed: 5: Supervision Details for Bed Mobility Assistance: Increased time Transfers Transfers: Sit to Stand;Stand to Sit Sit to Stand: 4: Min assist;With upper extremity assist;From bed Stand to Sit: 4: Min assist;With upper extremity assist;With armrests;To chair/3-in-1 Details for Transfer Assistance: VCs for safe hand placement Ambulation/Gait Ambulation/Gait Assistance: 4: Min guard Ambulation Distance (Feet): 16 Feet Assistive device: Rolling walker Ambulation/Gait Assistance Details: vc's for proper use of device; staying within rw and hand placement on rw. Gait Pattern: Step-through pattern;Decreased stride length;Left steppage;Trunk flexed;Narrow base of support Gait velocity: decreased General Gait Details: pt mildly unsteady with ambulation secondary to  weakness on left side Modified Rankin (Stroke Patients Only) Pre-Morbid Rankin Score: Moderate disability Modified Rankin: Moderately severe disability           PT Diagnosis: Difficulty walking;Abnormality of gait;Generalized weakness  PT Problem List: Decreased strength;Decreased range of motion;Decreased activity tolerance;Decreased balance;Decreased mobility;Decreased coordination;Decreased knowledge of use of DME;Decreased safety awareness PT Treatment Interventions: DME instruction;Gait training;Stair training;Functional mobility training;Therapeutic activities;Therapeutic exercise;Balance training;Neuromuscular re-education;Patient/family education   PT Goals Acute Rehab PT Goals PT Goal Formulation: With patient Time For Goal Achievement: 08/30/12 Potential to Achieve Goals: Fair Pt will Stand: with modified independence PT Goal: Stand - Progress: Goal set today Pt will Ambulate: >150 feet;with modified independence PT Goal: Ambulate - Progress: Goal set today Pt will Go Up / Down Stairs: 1-2 stairs;with modified independence PT Goal: Up/Down Stairs - Progress: Goal set today  Visit Information  Last PT Received On: 08/23/12 Assistance Needed: +1    Subjective Data  Subjective: It just does not want to work for me" Re: right leg Patient Stated Goal: to get better   Prior Functioning  Home Living Lives With: Alone Available Help at Discharge: Family;Available PRN/intermittently Type of Home: House Home Access: Stairs to enter [employer] of Steps: 1 Entrance Stairs-Rails: None Home Layout: One level Bathroom Shower/Tub: Walk-in shower;Door [employer] Toilet: Handicapped height Bathroom Accessibility: Yes How Accessible: Accessible via walker Home Adaptive Equipment: Hand-held shower hose;Grab bars in shower;Straight cane Prior Function Level of Independence: Independent with assistive device(s) Able to Take Stairs?: No Driving: Yes Vocation:  Retired Musician: No difficulties Dominant Hand: Right    Cognition  Overall Cognitive Status: Appears within functional limits for tasks assessed/performed Arousal/Alertness: Awake/alert Orientation Level: Appears intact for tasks assessed Behavior During Session: [facility] for tasks performed  Extremity/Trunk Assessment Right Upper Extremity Assessment RUE ROM/Strength/Tone: Deficits RUE ROM/Strength/Tone Deficits: Decreased AROM at shoulder, rest WFL Left Upper Extremity Assessment LUE ROM/Strength/Tone: Deficits LUE ROM/Strength/Tone Deficits: Decreased AROM at shoulder, WFL elbow flexion, decreased strength in triceps for extension. Decreased wrist and digit flex/ext. LUE Coordination: Deficits LUE Coordination Deficits: Both are affected (Fm > GM) Right Lower Extremity Assessment RLE ROM/Strength/Tone: Deficits Left Lower Extremity Assessment LLE ROM/Strength/Tone: Deficits LLE ROM/Strength/Tone Deficits: decreased strength LLE mainly hip flexion; knee flexion and increased tone. LLE Coordination: Deficits   Balance Balance Balance Assessed: Yes  End of Session PT - End of Session Equipment Utilized During Treatment: Gait belt Activity Tolerance: Patient tolerated treatment well Patient left: in chair;with call bell/phone within reach Nurse Communication: Mobility status  GP Functional Assessment Tool Used: clincal judgement Functional Limitation: Mobility: Walking and moving around Mobility: Walking and Moving Around Current Status ([id number]): At least 60 percent but less than 80 percent impaired, limited or restricted Mobility: Walking and Moving Around Goal Status [id number]): At least 20 percent but less than 40 percent impaired, limited or restricted   Fabio Asa 08/23/2012, 1:36 PM  Charlotte Crumb, PT DPT  [phone]

## 2012-08-24 LAB — VITAMIN B12: Vitamin B-12: 365 pg/mL (ref 211–911)

## 2012-08-24 LAB — CK TOTAL AND CKMB (NOT AT ARMC)
CK, MB: 6.1 ng/mL (ref 0.3–4.0)
Total CK: 1022 U/L — ABNORMAL HIGH (ref 7–232)

## 2012-08-24 LAB — TSH: TSH: 3.171 u[IU]/mL (ref 0.350–4.500)

## 2012-08-24 MED ORDER — HEPARIN (PORCINE) IN NACL 100-0.45 UNIT/ML-% IJ SOLN
1350.0000 [IU]/h | INTRAMUSCULAR | Status: DC
  Administered 2012-08-24: 900 [IU]/h via INTRAVENOUS
  Filled 2012-08-24 (×3): qty 250

## 2012-08-24 MED ORDER — SODIUM CHLORIDE 0.9 % IV SOLN
INTRAVENOUS | Status: DC
  Administered 2012-08-24: 1000 mL via INTRAVENOUS
  Administered 2012-08-25 (×2): via INTRAVENOUS

## 2012-08-24 NOTE — Progress Notes (Signed)
[facility] PROGRESS NOTE  Erik Arnold ZOX:[id number] DOB: 1923-12-30 DOA: 08/22/2012 PCP: Quintella Reichert, MD  Assessment/Plan: Active Problems:  TIA (transient ischemic attack)  Atrial fibrillation  Cardiac enzymes elevated  Chest pain    Left-sided weakness, MRI shows Remote infarct left centrum semiovale/corona radiata. Tiny remote  cerebellar infarct. No acute infarct, continue aspirin and Plavix, will need SNF  Atrial fibrillation, with pauses on telemetry, therefore metoprolol has been held this morning, rate controlled, not considered to be a candidate for anticoagulation  Elevated cardiac enzymes, cardiology consulted,The Troponin is only mildly elevated. This may be secondary to TIA/CVA. With a relative index the MB elevation does not appear to be cardiac in origin. MRI negative for acute infarct, patient has been started on IV heparin. Continue aspirin Plavix, holding metoprolol today, no wall motion abnormalities on 2-D echo    1.   Code Status: full Family Communication: family updated about patient's clinical progress Disposition Plan:  SNF tomorrow if no further cardiology workup indicated   Brief narrative: Erik Arnold is an 78 y.o. male with a history of atrial fibrillation, not on anticoagulation, presenting with acute weakness involving left extremities. Onset of symptoms was at 5 PM 08/22/2012. Has no previous documentation of stroke nor TIA. CT scan showed an area of hypoattenuation involving the left frontal region indicative of probable old stroke. There were no acute changes. Patient's deficits improved in route to the emergency room. NIH stroke score following arrival was 2. He was noted TPA candidate because of rapidly resolving deficits and patient was beyond time window for treatment consideration with intravenous thrombolytic therapy. He's been taking aspirin 81 mg per day. Patient was not a TPA candidate secondary to Rapidly resolving deficits and beyond  time window for treatment consideration. He was admitted for further evaluation and treatment   Consultants:  Neurology  Cardiology  Procedures:  None  Antibiotics:  None  HPI/Subjective: No chest pain, no gross focal neurologic deficits  Objective: Filed Vitals:   08/23/12 1849 08/23/12 2202 08/24/12 0143 08/24/12 0613  BP: 108/55 121/50 93/54 112/49  Pulse: 99 91 98 86  Temp: 98.2 F (36.8 C) 98.6 F (37 C) 99.3 F (37.4 C) 98.2 F (36.8 C)  TempSrc: Oral Oral Oral Oral  Resp: 22 20 18 22   Height:      Weight:      SpO2: 97% 100% 97% 96%    Intake/Output Summary (Last 24 hours) at 08/24/12 0738 Last data filed at 08/23/12 0857  Gross per 24 hour  Intake    360 ml  Output    100 ml  Net    260 ml    Exam:  HENT:  Head: Atraumatic.  Nose: Nose normal.  Mouth/Throat: Oropharynx is clear and moist.  Eyes: Conjunctivae are normal. Pupils are equal, round, and reactive to light. No scleral icterus.  Neck: Neck supple. No tracheal deviation present.  Cardiovascular: Normal rate, regular rhythm, normal heart sounds and intact distal pulses.  Pulmonary/Chest: Effort normal and breath sounds normal. No respiratory distress.  Abdominal: Soft. Normal appearance and bowel sounds are normal. She exhibits no distension. There is no tenderness.  Musculoskeletal: She exhibits no edema and no tenderness.  Neurological: She is alert. No cranial nerve deficit.    Data Reviewed: Basic Metabolic Panel:  Lab 08/23/12 8119 08/22/12 2104  NA 133* 137  K 4.5 4.3  CL 100 108  CO2 24 --  GLUCOSE 90 92  BUN 24* 25*  CREATININE 0.86 0.80  CALCIUM 9.2 --  MG -- --  PHOS -- --    Liver Function Tests:  Lab 08/23/12 0022  AST 30  ALT 13  ALKPHOS 60  BILITOT 0.7  PROT 6.7  ALBUMIN 3.1*   No results found for this basename: LIPASE:5,AMYLASE:5 in the last 168 hours No results found for this basename: AMMONIA:5 in the last 168 hours  CBC:  Lab 08/23/12 0022  08/22/12 2104  WBC 14.6* --  NEUTROABS 11.3* --  HGB 12.9* 12.6*  HCT 36.3* 37.0*  MCV 107.1* --  PLT 193 --    Cardiac Enzymes:  Lab 08/23/12 2131 08/23/12 1522 08/23/12 0915 08/23/12 0740 08/23/12 0022  CKTOTAL -- -- 1031* -- 712*  CKMB -- -- 9.4* -- 8.7*  CKMBINDEX -- -- -- -- --  TROPONINI 0.35* <0.30 0.37* 0.42* 0.40*   BNP (last 3 results) No results found for this basename: PROBNP:3 in the last 8760 hours   CBG: No results found for this basename: GLUCAP:5 in the last 168 hours  No results found for this or any previous visit (from the past 240 hour(s)).   Studies: Ct Head Wo Contrast  08/22/2012  *RADIOLOGY REPORT*  Clinical Data: History of fall and severe weakness on the left side.  Code stroke.  CT HEAD WITHOUT CONTRAST  Technique:  Contiguous axial images were obtained from the base of the skull through the vertex without contrast.  Comparison: No priors.  Findings: Focal area of low attenuation in the left frontal periventricular white matter (images 21 and 22 of series 2). Focus of low attenuation in the left putamen is compatible with an old lacunar infarction.  A similar finding is present in the inferior aspect of the right putamen.  Physiologic calcifications of the basal ganglia bilaterally.  Mild cerebral and cerebellar atrophy. No definite evidence of acute intracranial hemorrhage, mass, mass effect, hydrocephalus or abnormal intra or extra-axial fluid collections.  Visualized paranasal sinuses and mastoids are well pneumatized.  No acute displaced skull fractures are noted.  IMPRESSION: 1.  Slightly ill-defined focus of low attenuation in the left frontal lobe periventricular white matter could represent an area of age indeterminate ischemia. 2.  Mild cerebral and cerebellar atrophy with old bilateral basal ganglia lacunar infarctions.  These results were called by telephone on 08/22/2012 at 08:50 p.m. to Dr. Roseanne Reno, who verbally acknowledged these results.    Original Report Authenticated By: Trudie Reed, M.D.    Mr Brain Wo Contrast  08/23/2012  *RADIOLOGY REPORT*  Clinical Data:  Acute left-sided weakness which has resolved. Atrial fibrillation.  MRI BRAIN WITHOUT CONTRAST MRA HEAD WITHOUT CONTRAST  Technique: Multiplanar, multiecho pulse sequences of the brain and surrounding structures were obtained according to standard protocol without intravenous contrast.  Angiographic images of the head were obtained using MRA technique without contrast.  Comparison: 08/22/2012 head CT.  No comparison brain MR.  MRI HEAD  Findings:  No acute infarct.  Remote infarct left centrum semiovale/corona radiata. Tiny remote cerebellar infarct.  Small vessel disease type changes.  No intracranial hemorrhage.  No intracranial mass lesion detected on this unenhanced exam.  Global atrophy without hydrocephalus.  Mild transverse ligament hypertrophy.  Cervical medullary junction, pituitary region, pineal region and orbital structures unremarkable.  IMPRESSION: No acute infarct.  Please see above.  MRA HEAD  Findings: Anterior circulation without medium or large size vessel significant stenosis or occlusion.  Middle cerebral artery branch vessel irregularity bilaterally.  Left vertebral artery is dominant.  Mild smooth narrowing distal  left vertebral artery.  Right vertebral artery predominately ends in a PICA distribution. Moderate tandem stenosis involving portions of the distal right vertebral artery and the right PICA.  Mild narrowing portions of the left PICA.  Mild narrowing and irregularity of the mid aspect of the basilar artery.  Tiny bulge of the right lateral aspect of the basilar artery without discrete saccular aneurysm.  This may be related to atherosclerotic type changes.  Nonvisualization of the left AICA.  Mild irregularity and areas of narrowing of portions of the superior cerebellar artery bilaterally and distal aspect of the posterior cerebral artery more notable  on the left.  Fetal type origin of the left posterior cerebral artery.  IMPRESSION: Intracranial atherosclerotic type changes predominately involving branch vessels and the right vertebral artery as noted above.   Original Report Authenticated By: Lacy Duverney, M.D.    Mr Mra Head/brain Wo Cm  08/23/2012  *RADIOLOGY REPORT*  Clinical Data:  Acute left-sided weakness which has resolved. Atrial fibrillation.  MRI BRAIN WITHOUT CONTRAST MRA HEAD WITHOUT CONTRAST  Technique: Multiplanar, multiecho pulse sequences of the brain and surrounding structures were obtained according to standard protocol without intravenous contrast.  Angiographic images of the head were obtained using MRA technique without contrast.  Comparison: 08/22/2012 head CT.  No comparison brain MR.  MRI HEAD  Findings:  No acute infarct.  Remote infarct left centrum semiovale/corona radiata. Tiny remote cerebellar infarct.  Small vessel disease type changes.  No intracranial hemorrhage.  No intracranial mass lesion detected on this unenhanced exam.  Global atrophy without hydrocephalus.  Mild transverse ligament hypertrophy.  Cervical medullary junction, pituitary region, pineal region and orbital structures unremarkable.  IMPRESSION: No acute infarct.  Please see above.  MRA HEAD  Findings: Anterior circulation without medium or large size vessel significant stenosis or occlusion.  Middle cerebral artery branch vessel irregularity bilaterally.  Left vertebral artery is dominant.  Mild smooth narrowing distal left vertebral artery.  Right vertebral artery predominately ends in a PICA distribution. Moderate tandem stenosis involving portions of the distal right vertebral artery and the right PICA.  Mild narrowing portions of the left PICA.  Mild narrowing and irregularity of the mid aspect of the basilar artery.  Tiny bulge of the right lateral aspect of the basilar artery without discrete saccular aneurysm.  This may be related to atherosclerotic  type changes.  Nonvisualization of the left AICA.  Mild irregularity and areas of narrowing of portions of the superior cerebellar artery bilaterally and distal aspect of the posterior cerebral artery more notable on the left.  Fetal type origin of the left posterior cerebral artery.  IMPRESSION: Intracranial atherosclerotic type changes predominately involving branch vessels and the right vertebral artery as noted above.   Original Report Authenticated By: Lacy Duverney, M.D.     Scheduled Meds:   . aspirin  81 mg Oral Daily  . clopidogrel  75 mg Oral Q breakfast  . digoxin  0.125 mg Oral Daily  . metoprolol tartrate  12.5 mg Oral BID   Continuous Infusions:   . sodium chloride      Active Problems:  TIA (transient ischemic attack)  Atrial fibrillation  Cardiac enzymes elevated  Chest pain    Time spent: 40 minutes   [facility]  [facility] Pager [phone]. If 8PM-8AM, please contact night-coverage at [email or web address], password [facility] 08/24/2012, 7:38 AM  LOS: 2 days

## 2012-08-24 NOTE — Progress Notes (Signed)
Stroke Team Progress Note  HISTORY Erik Arnold is an 78 y.o. male with a history of atrial fibrillation, not on anticoagulation, presenting with acute weakness involving left extremities. Onset of symptoms was at 5 PM 08/22/2012. Has no previous documentation of stroke nor TIA. CT scan showed an area of hypoattenuation involving the left frontal region indicative of probable old stroke. There were no acute changes. Patient's deficits improved in route to the emergency room. NIH stroke score following arrival was 2. He was noted TPA candidate because of rapidly resolving deficits and patient was beyond time window for treatment consideration with intravenous thrombolytic therapy. He's been taking aspirin 81 mg per day. Patient was not a TPA candidate secondary to rapidly resolving deficits and beyond time window for treatment consideration. He was admitted for further evaluation and treatment.  SUBJECTIVE Patient remembers his bladder leaking for a couple of years, had progressed from frequent nocturia. Also reports using a cane for the past few years.  OBJECTIVE Most recent Vital Signs: Filed Vitals:   08/23/12 1849 08/23/12 2202 08/24/12 0143 08/24/12 0613  BP: 108/55 121/50 93/54 112/49  Pulse: 99 91 98 86  Temp: 98.2 F (36.8 C) 98.6 F (37 C) 99.3 F (37.4 C) 98.2 F (36.8 C)  TempSrc: Oral Oral Oral Oral  Resp: 22 20 18 22   Height:      Weight:      SpO2: 97% 100% 97% 96%   IV Fluid Intake:     . sodium chloride 1,000 mL (08/24/12 0907)  . heparin     MEDICATIONS    . aspirin  81 mg Oral Daily  . clopidogrel  75 mg Oral Q breakfast  . digoxin  0.125 mg Oral Daily   PRN:    Diet:  Cardiac thin liquids Activity:  OOB DVT Prophylaxis:  Heparin 5000 units sq tid  CLINICALLY SIGNIFICANT STUDIES Basic Metabolic Panel:   Lab 08/23/12 0022 08/22/12 2104  NA 133* 137  K 4.5 4.3  CL 100 108  CO2 24 --  GLUCOSE 90 92  BUN 24* 25*  CREATININE 0.86 0.80  CALCIUM 9.2 --  MG  -- --  PHOS -- --   Liver Function Tests:   Lab 08/23/12 0022  AST 30  ALT 13  ALKPHOS 60  BILITOT 0.7  PROT 6.7  ALBUMIN 3.1*   CBC:   Lab 08/23/12 0022 08/22/12 2104  WBC 14.6* --  NEUTROABS 11.3* --  HGB 12.9* 12.6*  HCT 36.3* 37.0*  MCV 107.1* --  PLT 193 --   Coagulation:   Lab 08/23/12 0022  LABPROT 14.1  INR 1.10   Cardiac Enzymes:   Lab 08/23/12 2131 08/23/12 1522 08/23/12 0915 08/23/12 0022  CKTOTAL -- -- 1031* 712*  CKMB -- -- 9.4* 8.7*  CKMBINDEX -- -- -- --  TROPONINI 0.35* <0.30 0.37* --   Urinalysis: No results found for this basename: COLORURINE:2,APPERANCEUR:2,LABSPEC:2,PHURINE:2,GLUCOSEU:2,HGBUR:2,BILIRUBINUR:2,KETONESUR:2,PROTEINUR:2,UROBILINOGEN:2,NITRITE:2,LEUKOCYTESUR:2 in the last 168 hours  Lipid Panel     Component Value Date/Time   CHOL 129 08/23/2012 0915   TRIG 55 08/23/2012 0915   HDL 44 08/23/2012 0915   CHOLHDL 2.9 08/23/2012 0915   VLDL 11 08/23/2012 0915   LDLCALC 74 08/23/2012 0915   HgbA1C  Lab Results  Component Value Date   HGBA1C 5.0 08/23/2012    Urine Drug Screen:     Component Value Date/Time   LABOPIA NONE DETECTED 08/22/2012 2230   COCAINSCRNUR NONE DETECTED 08/22/2012 2230   LABBENZ NONE DETECTED 08/22/2012 2230  AMPHETMU NONE DETECTED 08/22/2012 2230   THCU NONE DETECTED 08/22/2012 2230   LABBARB NONE DETECTED 08/22/2012 2230    Alcohol Level: No results found for this basename: ETH:2 in the last 168 hours  CT of the brain  08/22/2012   1.  Slightly ill-defined focus of low attenuation in the left frontal lobe periventricular white matter could represent an area of age indeterminate ischemia. 2.  Mild cerebral and cerebellar atrophy with old bilateral basal ganglia lacunar infarctions.  MRI of the brain  08/23/2012  No acute infarct.  MRA of the brain  08/23/2012  Intracranial atherosclerotic type changes predominately involving branch vessels and the right vertebral artery.  2D Echocardiogram   08/23/2012 EF 55-60% with no source of embolus.   Carotid Doppler  08/23/2012 Carotid duplex demonstrates minimal plaque with out ICA stenosis bilaterally. Vertebral artery flow antegrade.  CXR    EKG     Therapy Recommendations SNF  GENERAL EXAM:  Patient is in no distress  CARDIOVASCULAR:  Regular rate and rhythm, no murmurs, no carotid bruits  NEUROLOGIC:  MENTAL STATUS: awake, alert, language fluent, comprehension intact, naming intact  CRANIAL NERVE: pupils equal and reactive to light, visual fields full to confrontation, extraocular muscles intact, no nystagmus, facial sensation and strength symmetric, uvula midline, shoulder shrug symmetric, tongue midline.  MOTOR: LUE AND LLE (3-4/5).  SENSORY: normal and symmetric to light touch  COORDINATION: DECR IN LUE TO FFM.  REFLEXES: deep tendon reflexes TRACE and symmetric  ASSESSMENT [name] is a 78 y.o. male presenting with left sided weakness. No acute infarct seen on MRI. Dx: right brain TIA. TIA felt to be embolic secondary to atrial fibrillation.  Has not been a coumadin candidate in the past due to risk of fall. Work up completed. On aspirin 81 mg orally every day prior to admission. Now on aspirin 81 mg orally every day, clopidogrel 75 mg orally every day and heparin for secondary stroke prevention. Patient with ongoing left hemiparesis arm and leg accompanied by bladder leaking. ? Etiology of ongoing weakness  Atrial fibrillation, not a coumadin candidate in the past secondary to fall risk. New lopressor for HR control Elevated cardiac enzymes. Dr. Mayford Knife following. On IV heparin  LDL 74 HgbA1c 5.0  Hospital day # 2  TREATMENT/PLAN  Long term, recommend anticoagulation for secondary stroke prevention. Will stop aspirin 81 mg orally every day and clopidogrel 75 mg orally every day for secondary stroke prevention given current IV heparin drip  MR cervical spine without contrast to further eval etiology of left  hemiparesis, weakness, ataxia   SHARON BIBY, MSN, RN, ANVP-BC, ANP-BC, GNP-BC [facility] Pager: [phone] 08/24/2012 9:10 AM  I have personally obtained a history, examined the patient, evaluated imaging results, and formulated the assessment and plan of care. I agree with the above.   [facility] - Stroke Team Joycelyn Schmid, MD 08/24/2012 9:10 AM  Please refer to [email or web address] for on-call Stroke MD

## 2012-08-24 NOTE — Progress Notes (Signed)
ANTICOAGULATION CONSULT NOTE - Follow Up Consult  Pharmacy Consult for Heparin (no bolus) Indication: Elevated troponin, admitted with CVA  No Known Allergies  Patient Measurements: Height: 5\' 8"  (172.7 cm) (22f8i) Weight: 167 lb 15.9 oz (76.2 kg) IBW/kg (Calculated) : 68.4   Vital Signs: Temp: 98.5 F (36.9 C) (12/25 1800) Temp src: Oral (12/25 1800) BP: 108/68 mmHg (12/25 1800) Pulse Rate: 80  (12/25 1800)  Labs:  Basename 08/24/12 1816 08/24/12 0824 08/23/12 2131 08/23/12 1522 08/23/12 0915 08/23/12 0022 08/22/12 2104  HGB -- -- -- -- -- 12.9* 12.6*  HCT -- -- -- -- -- 36.3* 37.0*  PLT -- -- -- -- -- 193 --  APTT -- -- -- -- -- 37 --  LABPROT -- -- -- -- -- 14.1 --  INR -- -- -- -- -- 1.10 --  HEPARINUNFRC 0.22* -- -- -- -- -- --  CREATININE -- -- -- -- -- 0.86 0.80  CKTOTAL -- 1022* -- -- 1031* 712* --  CKMB -- 6.1* -- -- 9.4* 8.7* --  TROPONINI -- -- 0.35* <0.30 0.37* -- --    Estimated Creatinine Clearance: 59.7 ml/min (by C-G formula based on Cr of 0.86).   Medications:  Scheduled:    . digoxin  0.125 mg Oral Daily  . [DISCONTINUED] aspirin  81 mg Oral Daily  . [DISCONTINUED] clopidogrel  75 mg Oral Q breakfast  . [DISCONTINUED] heparin  5,000 Units Subcutaneous Q8H  . [DISCONTINUED] metoprolol tartrate  12.5 mg Oral BID    Assessment: 78 y/o M with history of afib who presented with left extremity weakness. Per stroke team note, no acute changes on CT. Stroke team stopped ASA and plavix given current heparin drip. Pharmacy consulted to dose heparin drip for elevated cardiac enzymes. Cardiology not recommending cath, possible NST. CBC, renal function stable. No bleeding reported. HL at 1816 is 0.22 on 900 units/hr of heparin. Will increase infusion conservatively given patient presentation and re-assess level with AM labs.  Goal of Therapy:   Heparin level 0.3-0.5 Monitor platelets by anticoagulation protocol: Yes   Plan:  - Increase heparin infusion to  1000 units/hr - Daily CBC/HL - Monitor renal function - Monitor for bleeding, mental status changes given patient presentation  Abran Duke, PharmD Clinical Pharmacist Phone: [phone] Pager: [phone] 08/24/2012 7:24 PM

## 2012-08-24 NOTE — Progress Notes (Signed)
ANTICOAGULATION CONSULT NOTE - Initial Consult  Pharmacy Consult for Heparin (no bolus) Indication: Elevated troponin, admitted with CVA  No Known Allergies  Patient Measurements: Height: 5\' 8"  (172.7 cm) (62f8i) Weight: 167 lb 15.9 oz (76.2 kg) IBW/kg (Calculated) : 68.4   Vital Signs: Temp: 98.2 F (36.8 C) (12/25 0613) Temp src: Oral (12/25 0613) BP: 112/49 mmHg (12/25 0613) Pulse Rate: 86  (12/25 0613)  Labs:  Basename 08/23/12 2131 08/23/12 1522 08/23/12 0915 08/23/12 0022 08/22/12 2104  HGB -- -- -- 12.9* 12.6*  HCT -- -- -- 36.3* 37.0*  PLT -- -- -- 193 --  APTT -- -- -- 37 --  LABPROT -- -- -- 14.1 --  INR -- -- -- 1.10 --  HEPARINUNFRC -- -- -- -- --  CREATININE -- -- -- 0.86 0.80  CKTOTAL -- -- 1031* 712* --  CKMB -- -- 9.4* 8.7* --  TROPONINI 0.35* <0.30 0.37* -- --    Estimated Creatinine Clearance: 59.7 ml/min (by C-G formula based on Cr of 0.86).  Medical History: Past Medical History  Diagnosis Date  . AF (atrial fibrillation)     Not on coumadin as he was deemed a fall risk after he fell in his cardiologists office.  . Stroke 08/22/2012    residual left sided weakness (08/23/2012)   Assessment: Mr. Elizabeth Haff is a 78 y.o. male presenting with left sided weakness. Suspect right brain stroke. Imaging pending. . Infarct felt to be embolic secondary to atrial fibrillation. Has not been a coumadin candidate in the past due to risk of fall. Work up underway. On aspirin 81 mg orally every day prior to admission. Now on aspirin 81 mg orally every day and clopidogrel 75 mg orally every day for secondary stroke prevention. Patient with resultant left hemiparesis arm and leg.  atrial fibrillation, not a coumadin candidate secondary to fall risk. New lopressor for HR control  Elevated cardiac enzymes. Pharmacy asked to begin IV heparin  Goal of Therapy:  Heparin level 0.3-0.5 units/ml Monitor platelets by anticoagulation protocol: Yes   Plan:  1) No bolus 2)  Heparin drip at 900 units / hr 3) Heparin level 8 hours after heparin begins 4) Daily heparin level, CBC  Thank you. Okey Regal, PharmD [phone]  08/24/2012,8:45 AM

## 2012-08-24 NOTE — Progress Notes (Signed)
SUBJECTIVE:  No complaints  OBJECTIVE:   Vitals:   Filed Vitals:   08/23/12 1849 08/23/12 2202 08/24/12 0143 08/24/12 0613  BP: 108/55 121/50 93/54 112/49  Pulse: 99 91 98 86  Temp: 98.2 F (36.8 C) 98.6 F (37 C) 99.3 F (37.4 C) 98.2 F (36.8 C)  TempSrc: Oral Oral Oral Oral  Resp: 22 20 18 22   Height:      Weight:      SpO2: 97% 100% 97% 96%   I&O's:  No intake or output data in the 24 hours ending 08/24/12 1005 TELEMETRY: Reviewed telemetry pt in atrial fibrilation     PHYSICAL EXAM General: Well developed, well nourished, in no acute distress Head: Eyes PERRLA, No xanthomas.   Normal cephalic and atramatic  Lungs:   Clear bilaterally to auscultation and percussion. Heart:   Irregularly irregular S1 S2 Pulses are 2+ & equal. Abdomen: Bowel sounds are positive, abdomen soft and non-tender without masses Extremities:   No clubbing, cyanosis or edema.  DP +1 Neuro: Alert and oriented X 3. Psych:  Good affect, responds appropriately   LABS: Basic Metabolic Panel:  Basename 08/23/12 0022 08/22/12 2104  NA 133* 137  K 4.5 4.3  CL 100 108  CO2 24 --  GLUCOSE 90 92  BUN 24* 25*  CREATININE 0.86 0.80  CALCIUM 9.2 --  MG -- --  PHOS -- --   Liver Function Tests:  [facility] 08/23/12 0022  AST 30  ALT 13  ALKPHOS 60  BILITOT 0.7  PROT 6.7  ALBUMIN 3.1*   No results found for this basename: LIPASE:2,AMYLASE:2 in the last 72 hours CBC:  Basename 08/23/12 0022 08/22/12 2104  WBC 14.6* --  NEUTROABS 11.3* --  HGB 12.9* 12.6*  HCT 36.3* 37.0*  MCV 107.1* --  PLT 193 --   Cardiac Enzymes:  Basename 08/24/12 0824 08/23/12 2131 08/23/12 1522 08/23/12 0915 08/23/12 0022  CKTOTAL 1022* -- -- 1031* 712*  CKMB 6.1* -- -- 9.4* 8.7*  CKMBINDEX -- -- -- -- --  TROPONINI -- 0.35* <0.30 0.37* --   BNP: No components found with this basename: POCBNP:3 D-Dimer: No results found for this basename: DDIMER:2 in the last 72 hours Hemoglobin A1C:  Basename  08/23/12 0700  HGBA1C 5.0   Fasting Lipid Panel:  Basename 08/23/12 0915  CHOL 129  HDL 44  LDLCALC 74  TRIG 55  CHOLHDL 2.9  LDLDIRECT --   Thyroid Function Tests: No results found for this basename: TSH,T4TOTAL,FREET3,T3FREE,THYROIDAB in the last 72 hours Anemia Panel: No results found for this basename: VITAMINB12,FOLATE,FERRITIN,TIBC,IRON,RETICCTPCT in the last 72 hours Coag Panel:   Lab Results  Component Value Date   INR 1.10 08/23/2012    RADIOLOGY: Ct Head Wo Contrast  08/22/2012  *RADIOLOGY REPORT*  Clinical Data: History of fall and severe weakness on the left side.  Code stroke.  CT HEAD WITHOUT CONTRAST  Technique:  Contiguous axial images were obtained from the base of the skull through the vertex without contrast.  Comparison: No priors.  Findings: Focal area of low attenuation in the left frontal periventricular white matter (images 21 and 22 of series 2). Focus of low attenuation in the left putamen is compatible with an old lacunar infarction.  A similar finding is present in the inferior aspect of the right putamen.  Physiologic calcifications of the basal ganglia bilaterally.  Mild cerebral and cerebellar atrophy. No definite evidence of acute intracranial hemorrhage, mass, mass effect, hydrocephalus or abnormal intra or extra-axial fluid  collections.  Visualized paranasal sinuses and mastoids are well pneumatized.  No acute displaced skull fractures are noted.  IMPRESSION: 1.  Slightly ill-defined focus of low attenuation in the left frontal lobe periventricular white matter could represent an area of age indeterminate ischemia. 2.  Mild cerebral and cerebellar atrophy with old bilateral basal ganglia lacunar infarctions.  These results were called by telephone on 08/22/2012 at 08:50 p.m. to Dr. Roseanne Reno, who verbally acknowledged these results.   Original Report Authenticated By: Trudie Reed, M.D.    Mr Brain Wo Contrast  08/23/2012  *RADIOLOGY REPORT*  Clinical  Data:  Acute left-sided weakness which has resolved. Atrial fibrillation.  MRI BRAIN WITHOUT CONTRAST MRA HEAD WITHOUT CONTRAST  Technique: Multiplanar, multiecho pulse sequences of the brain and surrounding structures were obtained according to standard protocol without intravenous contrast.  Angiographic images of the head were obtained using MRA technique without contrast.  Comparison: 08/22/2012 head CT.  No comparison brain MR.  MRI HEAD  Findings:  No acute infarct.  Remote infarct left centrum semiovale/corona radiata. Tiny remote cerebellar infarct.  Small vessel disease type changes.  No intracranial hemorrhage.  No intracranial mass lesion detected on this unenhanced exam.  Global atrophy without hydrocephalus.  Mild transverse ligament hypertrophy.  Cervical medullary junction, pituitary region, pineal region and orbital structures unremarkable.  IMPRESSION: No acute infarct.  Please see above.  MRA HEAD  Findings: Anterior circulation without medium or large size vessel significant stenosis or occlusion.  Middle cerebral artery branch vessel irregularity bilaterally.  Left vertebral artery is dominant.  Mild smooth narrowing distal left vertebral artery.  Right vertebral artery predominately ends in a PICA distribution. Moderate tandem stenosis involving portions of the distal right vertebral artery and the right PICA.  Mild narrowing portions of the left PICA.  Mild narrowing and irregularity of the mid aspect of the basilar artery.  Tiny bulge of the right lateral aspect of the basilar artery without discrete saccular aneurysm.  This may be related to atherosclerotic type changes.  Nonvisualization of the left AICA.  Mild irregularity and areas of narrowing of portions of the superior cerebellar artery bilaterally and distal aspect of the posterior cerebral artery more notable on the left.  Fetal type origin of the left posterior cerebral artery.  IMPRESSION: Intracranial atherosclerotic type changes  predominately involving branch vessels and the right vertebral artery as noted above.   Original Report Authenticated By: Lacy Duverney, M.D.    Mr Mra Head/brain Wo Cm  08/23/2012  *RADIOLOGY REPORT*  Clinical Data:  Acute left-sided weakness which has resolved. Atrial fibrillation.  MRI BRAIN WITHOUT CONTRAST MRA HEAD WITHOUT CONTRAST  Technique: Multiplanar, multiecho pulse sequences of the brain and surrounding structures were obtained according to standard protocol without intravenous contrast.  Angiographic images of the head were obtained using MRA technique without contrast.  Comparison: 08/22/2012 head CT.  No comparison brain MR.  MRI HEAD  Findings:  No acute infarct.  Remote infarct left centrum semiovale/corona radiata. Tiny remote cerebellar infarct.  Small vessel disease type changes.  No intracranial hemorrhage.  No intracranial mass lesion detected on this unenhanced exam.  Global atrophy without hydrocephalus.  Mild transverse ligament hypertrophy.  Cervical medullary junction, pituitary region, pineal region and orbital structures unremarkable.  IMPRESSION: No acute infarct.  Please see above.  MRA HEAD  Findings: Anterior circulation without medium or large size vessel significant stenosis or occlusion.  Middle cerebral artery branch vessel irregularity bilaterally.  Left vertebral artery is dominant.  Mild  smooth narrowing distal left vertebral artery.  Right vertebral artery predominately ends in a PICA distribution. Moderate tandem stenosis involving portions of the distal right vertebral artery and the right PICA.  Mild narrowing portions of the left PICA.  Mild narrowing and irregularity of the mid aspect of the basilar artery.  Tiny bulge of the right lateral aspect of the basilar artery without discrete saccular aneurysm.  This may be related to atherosclerotic type changes.  Nonvisualization of the left AICA.  Mild irregularity and areas of narrowing of portions of the superior  cerebellar artery bilaterally and distal aspect of the posterior cerebral artery more notable on the left.  Fetal type origin of the left posterior cerebral artery.  IMPRESSION: Intracranial atherosclerotic type changes predominately involving branch vessels and the right vertebral artery as noted above.   Original Report Authenticated By: Lacy Duverney, M.D.    ASSESSMENT:  1. Elevated cardiac enzymes of ? Etiology. His CPK and MB are elevated but the relative index is normal. The Troponin is only mildly elevated. This is most likely secondary to TIA/CVA. With a relative index the MB elevation does not appear to be cardiac in origin. He does give a history of some intermittent chest discomfort over the past few weeks but sounds like indigestion.  2. TIA with cerebrovascular atherosclerosis by MRI/MRA 3. Chronic atrial fibrillation - not a coumadin candidate secondary to fall risk - HR increased today 4.  Normal LVF by echo PLAN:  1. Continue Plavix/ASA/dig/lopressor 2.  I suspect troponin elevation is due to his TIA/CVA.  His relative index is normal making the elevation in his CPK and MB noncardiac in origin.  He has had some chest discomfort over the past few weeks which he thinks is indigestion.  I recommend that we proceed with nuclear stress test to rule out ischemia prior to d/c home.  Would not proceed with cardiac cath at this time due to recent TIA/CVA unless nuclear stress test shows a large area of ischemia. 3.  NPO after MN     Quintella Reichert, MD  08/24/2012  10:05 AM

## 2012-08-25 ENCOUNTER — Observation Stay (HOSPITAL_COMMUNITY): Payer: Medicare Other

## 2012-08-25 LAB — COMPREHENSIVE METABOLIC PANEL
Albumin: 2.3 g/dL — ABNORMAL LOW (ref 3.5–5.2)
BUN: 21 mg/dL (ref 6–23)
Chloride: 108 mEq/L (ref 96–112)
Creatinine, Ser: 0.7 mg/dL (ref 0.50–1.35)
Total Bilirubin: 0.3 mg/dL (ref 0.3–1.2)
Total Protein: 5.7 g/dL — ABNORMAL LOW (ref 6.0–8.3)

## 2012-08-25 LAB — CBC
MCV: 106.5 fL — ABNORMAL HIGH (ref 78.0–100.0)
Platelets: 199 10*3/uL (ref 150–400)
RBC: 3.07 MIL/uL — ABNORMAL LOW (ref 4.22–5.81)
WBC: 9 10*3/uL (ref 4.0–10.5)

## 2012-08-25 LAB — HEPARIN LEVEL (UNFRACTIONATED): Heparin Unfractionated: 0.17 IU/mL — ABNORMAL LOW (ref 0.30–0.70)

## 2012-08-25 MED ORDER — ASPIRIN 81 MG PO TBEC: 81.0000 mg | DELAYED_RELEASE_TABLET | Freq: Every day | ORAL | Status: DC

## 2012-08-25 MED ORDER — GADOBENATE DIMEGLUMINE 529 MG/ML IV SOLN
15.0000 mL | Freq: Once | INTRAVENOUS | Status: AC
  Administered 2012-08-25: 15 mL via INTRAVENOUS

## 2012-08-25 MED ORDER — REGADENOSON 0.4 MG/5ML IV SOLN
0.4000 mg | Freq: Once | INTRAVENOUS | Status: DC
  Filled 2012-08-25: qty 5

## 2012-08-25 MED ORDER — CLOPIDOGREL BISULFATE 75 MG PO TABS: 75.0000 mg | ORAL_TABLET | Freq: Every day | ORAL | Status: DC

## 2012-08-25 NOTE — Progress Notes (Addendum)
ANTICOAGULATION CONSULT NOTE - Follow Up Consult  Pharmacy Consult for Heparin Indication: elevated troponin (in the setting of CVA/TIA)  No Known Allergies  Patient Measurements: Height: 5\' 8"  (172.7 cm) (54f8i) Weight: 167 lb 15.9 oz (76.2 kg) IBW/kg (Calculated) : 68.4  Heparin Dosing Weight: 76.2 kg  Vital Signs: Temp: 97.9 F (36.6 C) (12/26 1300) Temp src: Oral (12/26 1300) BP: 106/55 mmHg (12/26 1300) Pulse Rate: 93  (12/26 1300)  Labs:  Basename 08/25/12 1540 08/25/12 0424 08/24/12 1816 08/24/12 0824 08/23/12 2131 08/23/12 1522 08/23/12 0915 08/23/12 0022 08/22/12 2104  HGB -- 11.6* -- -- -- -- -- 12.9* --  HCT -- 32.7* -- -- -- -- -- 36.3* 37.0*  PLT -- 199 -- -- -- -- -- 193 --  APTT -- -- -- -- -- -- -- 37 --  LABPROT -- -- -- -- -- -- -- 14.1 --  INR -- -- -- -- -- -- -- 1.10 --  HEPARINUNFRC 0.17* 0.28* 0.22* -- -- -- -- -- --  CREATININE -- 0.70 -- -- -- -- -- 0.86 0.80  CKTOTAL -- -- -- 1022* -- -- 1031* 712* --  CKMB -- -- -- 6.1* -- -- 9.4* 8.7* --  TROPONINI -- -- -- -- 0.35* <0.30 0.37* -- --    Estimated Creatinine Clearance: 64.1 ml/min (by C-G formula based on Cr of 0.7).   Medications:  Infusions:     . sodium chloride 75 mL/hr at 08/25/12 1352  . heparin 1,100 Units/hr (08/25/12 1000)    Assessment: [name] admitted 12/24 with L sided weakness.  Neuro work-up consistent with R sided TIA/CVA.  Pt has residual L sided hemiparesis.  12/25 pt noted to have elevated troponin and started on IV heparin. Cards note today recommends outpatient follow-up for cardiolyte with no mention of anticoagulation. Has not been a coumadin candidate in the past due to risk of fall.  Heparin level =0.17 down from 0.28  Goal of Therapy:  Heparin level 0.3-0.5 units/ml Monitor platelets by anticoagulation protocol: Yes   Plan:  Increase IV heparin to 1350 units/hr and recheck level in 6 hrs.  Adden: Just received orders per Dr. Mayford Knife through RN to d/c  heparin.   Fynley Chrystal S. Merilynn Finland, PharmD, BCPS Clinical Staff Pharmacist Pager [phone]  08/25/2012 4:20 PM

## 2012-08-25 NOTE — Discharge Summary (Signed)
Physician Discharge Summary  Erik Arnold MRN: [id number] DOB/AGE: 03/15/1924 78 y.o.  PCP: Quintella Reichert, MD   Admit date: 08/22/2012 Discharge date: 08/25/2012  Discharge Diagnoses:     TIA (transient ischemic attack)  Atrial fibrillation  Cardiac enzymes elevated  Chest pain     Medication List     As of 08/25/2012  7:46 AM    TAKE these medications         aspirin 81 MG EC tablet   Take 1 tablet (81 mg total) by mouth daily. Swallow whole.      clopidogrel 75 MG tablet   Commonly known as: PLAVIX   Take 1 tablet (75 mg total) by mouth daily.      digoxin 0.125 MG tablet   Commonly known as: LANOXIN   Take 0.125 mg by mouth daily.      metoprolol tartrate 25 MG tablet   Commonly known as: LOPRESSOR   Take 12.5 mg by mouth 2 (two) times daily.        Discharge Condition: Stable  Disposition:    Consults: #1 cardiology #2 neurology   Significant Diagnostic Studies: Ct Head Wo Contrast  08/22/2012  *RADIOLOGY REPORT*  Clinical Data: History of fall and severe weakness on the left side.  Code stroke.  CT HEAD WITHOUT CONTRAST  Technique:  Contiguous axial images were obtained from the base of the skull through the vertex without contrast.  Comparison: No priors.  Findings: Focal area of low attenuation in the left frontal periventricular white matter (images 21 and 22 of series 2). Focus of low attenuation in the left putamen is compatible with an old lacunar infarction.  A similar finding is present in the inferior aspect of the right putamen.  Physiologic calcifications of the basal ganglia bilaterally.  Mild cerebral and cerebellar atrophy. No definite evidence of acute intracranial hemorrhage, mass, mass effect, hydrocephalus or abnormal intra or extra-axial fluid collections.  Visualized paranasal sinuses and mastoids are well pneumatized.  No acute displaced skull fractures are noted.  IMPRESSION: 1.  Slightly ill-defined focus of low attenuation in  the left frontal lobe periventricular white matter could represent an area of age indeterminate ischemia. 2.  Mild cerebral and cerebellar atrophy with old bilateral basal ganglia lacunar infarctions.  These results were called by telephone on 08/22/2012 at 08:50 p.m. to Dr. Roseanne Reno, who verbally acknowledged these results.   Original Report Authenticated By: Trudie Reed, M.D.    Mr Brain Wo Contrast  08/23/2012  *RADIOLOGY REPORT*  Clinical Data:  Acute left-sided weakness which has resolved. Atrial fibrillation.  MRI BRAIN WITHOUT CONTRAST MRA HEAD WITHOUT CONTRAST  Technique: Multiplanar, multiecho pulse sequences of the brain and surrounding structures were obtained according to standard protocol without intravenous contrast.  Angiographic images of the head were obtained using MRA technique without contrast.  Comparison: 08/22/2012 head CT.  No comparison brain MR.  MRI HEAD  Findings:  No acute infarct.  Remote infarct left centrum semiovale/corona radiata. Tiny remote cerebellar infarct.  Small vessel disease type changes.  No intracranial hemorrhage.  No intracranial mass lesion detected on this unenhanced exam.  Global atrophy without hydrocephalus.  Mild transverse ligament hypertrophy.  Cervical medullary junction, pituitary region, pineal region and orbital structures unremarkable.  IMPRESSION: No acute infarct.  Please see above.  MRA HEAD  Findings: Anterior circulation without medium or large size vessel significant stenosis or occlusion.  Middle cerebral artery branch vessel irregularity bilaterally.  Left vertebral artery is dominant.  Mild  smooth narrowing distal left vertebral artery.  Right vertebral artery predominately ends in a PICA distribution. Moderate tandem stenosis involving portions of the distal right vertebral artery and the right PICA.  Mild narrowing portions of the left PICA.  Mild narrowing and irregularity of the mid aspect of the basilar artery.  Tiny bulge of the right  lateral aspect of the basilar artery without discrete saccular aneurysm.  This may be related to atherosclerotic type changes.  Nonvisualization of the left AICA.  Mild irregularity and areas of narrowing of portions of the superior cerebellar artery bilaterally and distal aspect of the posterior cerebral artery more notable on the left.  Fetal type origin of the left posterior cerebral artery.  IMPRESSION: Intracranial atherosclerotic type changes predominately involving branch vessels and the right vertebral artery as noted above.   Original Report Authenticated By: Lacy Duverney, M.D.    Mr Mra Head/brain Wo Cm  08/23/2012  *RADIOLOGY REPORT*  Clinical Data:  Acute left-sided weakness which has resolved. Atrial fibrillation.  MRI BRAIN WITHOUT CONTRAST MRA HEAD WITHOUT CONTRAST  Technique: Multiplanar, multiecho pulse sequences of the brain and surrounding structures were obtained according to standard protocol without intravenous contrast.  Angiographic images of the head were obtained using MRA technique without contrast.  Comparison: 08/22/2012 head CT.  No comparison brain MR.  MRI HEAD  Findings:  No acute infarct.  Remote infarct left centrum semiovale/corona radiata. Tiny remote cerebellar infarct.  Small vessel disease type changes.  No intracranial hemorrhage.  No intracranial mass lesion detected on this unenhanced exam.  Global atrophy without hydrocephalus.  Mild transverse ligament hypertrophy.  Cervical medullary junction, pituitary region, pineal region and orbital structures unremarkable.  IMPRESSION: No acute infarct.  Please see above.  MRA HEAD  Findings: Anterior circulation without medium or large size vessel significant stenosis or occlusion.  Middle cerebral artery branch vessel irregularity bilaterally.  Left vertebral artery is dominant.  Mild smooth narrowing distal left vertebral artery.  Right vertebral artery predominately ends in a PICA distribution. Moderate tandem stenosis  involving portions of the distal right vertebral artery and the right PICA.  Mild narrowing portions of the left PICA.  Mild narrowing and irregularity of the mid aspect of the basilar artery.  Tiny bulge of the right lateral aspect of the basilar artery without discrete saccular aneurysm.  This may be related to atherosclerotic type changes.  Nonvisualization of the left AICA.  Mild irregularity and areas of narrowing of portions of the superior cerebellar artery bilaterally and distal aspect of the posterior cerebral artery more notable on the left.  Fetal type origin of the left posterior cerebral artery.  IMPRESSION: Intracranial atherosclerotic type changes predominately involving branch vessels and the right vertebral artery as noted above.   Original Report Authenticated By: Lacy Duverney, M.D.      2-D echo LV EF: 55% - 60%  ------------------------------------------------------------ Indications: TIA 435.9.  ------------------------------------------------------------ History: PMH: Chest pain. Atrial fibrillation.  ------------------------------------------------------------ Study Conclusions  - Left ventricle: The cavity size was normal. There was mild concentric hypertrophy. Systolic function was normal. The estimated ejection fraction was in the range of 55% to 60%. Wall motion was normal; there were no regional wall motion abnormalities. - Aortic valve: Moderate diffuse thickening and calcification. Moderate regurgitation. - Mitral valve: Calcified annulus. Mild regurgitation. - Left atrium: The atrium was moderately dilated. - Right ventricle: The cavity size was moderately dilated. - Right atrium: The atrium was moderately dilated. - Atrial septum: No defect or patent foramen  ovale was identified. - Pulmonary arteries: PA peak pressure: 35mm Hg (S). Impressions:  - The right ventricular systolic pressure was increased consistent with mild pulmonary  hypertension.   Microbiology: No results found for this or any previous visit (from the past 240 hour(s)).   Labs: Results for orders placed during the hospital encounter of 08/22/12 (from the past 48 hour(s))  LIPID PANEL     Status: Normal   Collection Time   08/23/12  9:15 AM      Component Value Range Comment   Cholesterol 129  0 - 200 mg/dL    Triglycerides 55  <295 mg/dL    HDL 44  >62 mg/dL    Total CHOL/HDL Ratio 2.9      VLDL 11  0 - 40 mg/dL    LDL Cholesterol 74  0 - 99 mg/dL   TROPONIN I     Status: Abnormal   Collection Time   08/23/12  9:15 AM      Component Value Range Comment   Troponin I 0.37 (*) <0.30 ng/mL   CK TOTAL AND CKMB     Status: Abnormal   Collection Time   08/23/12  9:15 AM      Component Value Range Comment   Total CK 1031 (*) 7 - 232 U/L    CK, MB 9.4 (*) 0.3 - 4.0 ng/mL CRITICAL VALUE NOTED.  VALUE IS CONSISTENT WITH PREVIOUSLY REPORTED AND CALLED VALUE.   Relative Index 0.9  0.0 - 2.5   TROPONIN I     Status: Normal   Collection Time   08/23/12  3:22 PM      Component Value Range Comment   Troponin I <0.30  <0.30 ng/mL   TROPONIN I     Status: Abnormal   Collection Time   08/23/12  9:31 PM      Component Value Range Comment   Troponin I 0.35 (*) <0.30 ng/mL   CK TOTAL AND CKMB     Status: Abnormal   Collection Time   08/24/12  8:24 AM      Component Value Range Comment   Total CK 1022 (*) 7 - 232 U/L    CK, MB 6.1 (*) 0.3 - 4.0 ng/mL CRITICAL VALUE NOTED.  VALUE IS CONSISTENT WITH PREVIOUSLY REPORTED AND CALLED VALUE.   Relative Index 0.6  0.0 - 2.5   TSH     Status: Normal   Collection Time   08/24/12  8:24 AM      Component Value Range Comment   TSH 3.171  0.350 - 4.500 uIU/mL   VITAMIN B12     Status: Normal   Collection Time   08/24/12  8:24 AM      Component Value Range Comment   Vitamin B-12 365  211 - 911 pg/mL   HEPARIN LEVEL (UNFRACTIONATED)     Status: Abnormal   Collection Time   08/24/12  6:16 PM      Component  Value Range Comment   Heparin Unfractionated 0.22 (*) 0.30 - 0.70 IU/mL   COMPREHENSIVE METABOLIC PANEL     Status: Abnormal   Collection Time   08/25/12  4:24 AM      Component Value Range Comment   Sodium 138  135 - 145 mEq/L    Potassium 4.2  3.5 - 5.1 mEq/L    Chloride 108  96 - 112 mEq/L    CO2 21  19 - 32 mEq/L    Glucose, Bld 89  70 -  99 mg/dL    BUN 21  6 - 23 mg/dL    Creatinine, Ser 0.45  0.50 - 1.35 mg/dL    Calcium 7.8 (*) 8.4 - 10.5 mg/dL    Total Protein 5.7 (*) 6.0 - 8.3 g/dL    Albumin 2.3 (*) 3.5 - 5.2 g/dL    AST 31  0 - 37 U/L    ALT 15  0 - 53 U/L    Alkaline Phosphatase 58  39 - 117 U/L    Total Bilirubin 0.3  0.3 - 1.2 mg/dL    GFR calc non Af Amer 83 (*) >90 mL/min    GFR calc Af Amer >90  >90 mL/min   CBC     Status: Abnormal   Collection Time   08/25/12  4:24 AM      Component Value Range Comment   WBC 9.0  4.0 - 10.5 K/uL    RBC 3.07 (*) 4.22 - 5.81 MIL/uL    Hemoglobin 11.6 (*) 13.0 - 17.0 g/dL    HCT 40.9 (*) 81.1 - 52.0 %    MCV 106.5 (*) 78.0 - 100.0 fL    MCH 37.8 (*) 26.0 - 34.0 pg    MCHC 35.5  30.0 - 36.0 g/dL    RDW 91.4  78.2 - 95.6 %    Platelets 199  150 - 400 K/uL   HEPARIN LEVEL (UNFRACTIONATED)     Status: Abnormal   Collection Time   08/25/12  4:24 AM      Component Value Range Comment   Heparin Unfractionated 0.28 (*) 0.30 - 0.70 IU/mL      HPI : 78 y.o. male with a history of atrial fibrillation, not on anticoagulation, presenting with acute weakness involving left extremities. Onset of symptoms was at 5 PM 08/22/2012. Has no previous documentation of stroke nor TIA. CT scan showed an area of hypoattenuation involving the left frontal region indicative of probable old stroke. There were no acute changes. Patient's deficits improved in route to the emergency room. NIH stroke score following arrival was 2. He was noted not to be a TPA candidate because of rapidly resolving deficits and patient was beyond time window for treatment  consideration with intravenous thrombolytic therapy. He's been taking aspirin 81 mg per day. Patient was not a TPA candidate secondary to Rapidly resolving deficits and beyond time window for treatment consideration. He was admitted for further evaluation and treatment.   HOSPITAL COURSE:  #1 left-sided weakness MRI did not show any acute infarct Neurology recommended MRI of the C-spine to further evaluate left hemiparesis, weakness, ataxia MRI of the C-spine pending at this time Given his history of atrial fibrillation neurology recommends long-term anticoagulation for secondary stroke prevention However given his fall risk he is deemed not to be a candidate for long-term anticoagulation Will continue aspirin and Plavix at home    #2 abnormal cardiac enzymes Started on IV heparin drip based on cardiology recommendations, His CPK and MB are elevated but the relative index is normal. The Troponin is only mildly elevated.  Seen by cardiology Dr. Carolanne Grumbling Nuclear stress test today, if negative the patient will discharge  #3 atrial fibrillation Continue beta blocker On a candidate for full dose anticoagulation   Discharge Exam:  Blood pressure 113/52, pulse 88, temperature 97.9 F (36.6 C), temperature source Oral, resp. rate 18, height 5\' 8"  (1.727 m), weight 76.2 kg (167 lb 15.9 oz), SpO2 98.00%.    General: Well developed, well nourished, in  no acute distress  Head: Eyes PERRLA, No xanthomas. Normal cephalic and atramatic  Lungs: Clear bilaterally to auscultation and percussion.  Heart: Irregularly irregular S1 S2 Pulses are 2+ & equal.  Abdomen: Bowel sounds are positive, abdomen soft and non-tender without masses  Extremities: No clubbing, cyanosis or edema. DP +1  Neuro: Alert and oriented X 3.  Psych: Good affect, responds appropriately      Signed: Edwards Mckelvie 08/25/2012, 7:46 AM

## 2012-08-25 NOTE — Progress Notes (Signed)
Erik Arnold - Follow Up Consult  Pharmacy Consult for Heparin Indication: elevated troponin (in the setting of CVA/TIA)  No Known Allergies  Patient Measurements: Height: 5\' 8"  (172.7 cm) (3f8i) Weight: 167 lb 15.9 oz (Erik.2 kg) IBW/kg (Calculated) : 68.4  Heparin Dosing Weight: Erik.2 kg  Vital Signs: Temp: 97.9 F (36.6 C) (12/26 0600) Temp src: Oral (12/26 0600) BP: 113/52 mmHg (12/26 0600) Pulse Rate: 88  (12/26 0600)  Labs:  Erik Arnold 08/25/12 0424 08/24/12 1816 08/24/12 0824 08/23/12 2131 08/23/12 1522 08/23/12 0915 08/23/12 0022 08/22/12 2104  HGB 11.6* -- -- -- -- -- 12.9* --  HCT 32.7* -- -- -- -- -- 36.3* 37.0*  PLT 199 -- -- -- -- -- 193 --  APTT -- -- -- -- -- -- 37 --  LABPROT -- -- -- -- -- -- 14.1 --  INR -- -- -- -- -- -- 1.10 --  HEPARINUNFRC 0.28* 0.22* -- -- -- -- -- --  CREATININE 0.70 -- -- -- -- -- 0.86 0.80  CKTOTAL -- -- 1022* -- -- 1031* 712* --  CKMB -- -- 6.1* -- -- 9.4* 8.7* --  TROPONINI -- -- -- 0.35* <0.30 0.37* -- --    Estimated Creatinine Clearance: 64.1 ml/min (by C-G formula based on Cr of 0.7).   Medications:  Infusions:    . sodium chloride 75 mL/hr at 08/25/12 0113  . heparin 1,000 Units/hr (08/24/12 1922)    Assessment: Erik Arnold admitted 12/24 with L sided weakness.  Neuro work-up consistent with R sided TIA/CVA.  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  12/25 Erik Arnold noted to have elevated troponin and started on IV heparin.  Noted plans for nuclear stress test prior to discharge.  Heparin level = 0.28 on 1000 units/hr (goal 0.3-0.5).  Goal of Therapy:  Heparin level 0.3-0.5 units/ml Monitor platelets by Erik protocol: Yes   Plan:  Increase heparin to 1100 units/hr. Heparin level 6 hours after rate change. Follow-up plans/results of stress test. ?Restart ASA and Plavix.  [employer], Pharm.D., BCPS Clinical Pharmacist Pager [phone] 08/25/2012 8:43 AM

## 2012-08-25 NOTE — Progress Notes (Signed)
SUBJECTIVE:  No complaints today  OBJECTIVE:   Vitals:   Filed Vitals:   08/25/12 0200 08/25/12 0600 08/25/12 1050 08/25/12 1300  BP: 119/41 113/52  106/55  Pulse: 93 88 83 93  Temp: 98.5 F (36.9 C) 97.9 F (36.6 C) 97.8 F (36.6 C) 97.9 F (36.6 C)  TempSrc: Oral Oral Oral Oral  Resp: 18 18 18 18   Height:      Weight:      SpO2: 98% 98% 98% 98%   I&O's:   Intake/Output Summary (Last 24 hours) at 08/25/12 1603 Last data filed at 08/25/12 1001  Gross per 24 hour  Intake      0 ml  Output    400 ml  Net   -400 ml   TELEMETRY: Reviewed telemetry pt in atrial fibrillation:     PHYSICAL EXAM General: Well developed, well nourished, in no acute distress Head: Eyes PERRLA, No xanthomas.   Normal cephalic and atramatic  Lungs:   Clear bilaterally to auscultation and percussion. Heart:   Irregularly irregularS1 S2 Pulses are 2+ & equal. Abdomen: Bowel sounds are positive, abdomen soft and non-tender without masses  Extremities:   No clubbing, cyanosis or edema.  DP +1 Neuro: Alert and oriented X 3. Psych:  Good affect, responds appropriately   LABS: Basic Metabolic Panel:  Basename 08/25/12 0424 08/23/12 0022  NA 138 133*  K 4.2 4.5  CL 108 100  CO2 21 24  GLUCOSE 89 90  BUN 21 24*  CREATININE 0.70 0.86  CALCIUM 7.8* 9.2  MG -- --  PHOS -- --   Liver Function Tests:  [facility] 08/25/12 0424 08/23/12 0022  AST 31 30  ALT 15 13  ALKPHOS 58 60  BILITOT 0.3 0.7  PROT 5.7* 6.7  ALBUMIN 2.3* 3.1*   No results found for this basename: LIPASE:2,AMYLASE:2 in the last 72 hours CBC:  Basename 08/25/12 0424 08/23/12 0022  WBC 9.0 14.6*  NEUTROABS -- 11.3*  HGB 11.6* 12.9*  HCT 32.7* 36.3*  MCV 106.5* 107.1*  PLT 199 193   Cardiac Enzymes:  Basename 08/24/12 0824 08/23/12 2131 08/23/12 1522 08/23/12 0915 08/23/12 0022  CKTOTAL 1022* -- -- 1031* 712*  CKMB 6.1* -- -- 9.4* 8.7*  CKMBINDEX -- -- -- -- --  TROPONINI -- 0.35* <0.30 0.37* --   BNP: No  components found with this basename: POCBNP:3 D-Dimer: No results found for this basename: DDIMER:2 in the last 72 hours Hemoglobin A1C:  Basename 08/23/12 0700  HGBA1C 5.0   Fasting Lipid Panel:  Basename 08/23/12 0915  CHOL 129  HDL 44  LDLCALC 74  TRIG 55  CHOLHDL 2.9  LDLDIRECT --   Thyroid Function Tests:  Basename 08/24/12 0824  TSH 3.171  T4TOTAL --  T3FREE --  THYROIDAB --   Anemia Panel:  Basename 08/24/12 0824  VITAMINB12 365  FOLATE --  FERRITIN --  TIBC --  IRON --  RETICCTPCT --   Coag Panel:   Lab Results  Component Value Date   INR 1.10 08/23/2012    RADIOLOGY: Ct Head Wo Contrast  08/22/2012  *RADIOLOGY REPORT*  Clinical Data: History of fall and severe weakness on the left side.  Code stroke.  CT HEAD WITHOUT CONTRAST  Technique:  Contiguous axial images were obtained from the base of the skull through the vertex without contrast.  Comparison: No priors.  Findings: Focal area of low attenuation in the left frontal periventricular white matter (images 21 and 22 of series 2). Focus  of low attenuation in the left putamen is compatible with an old lacunar infarction.  A similar finding is present in the inferior aspect of the right putamen.  Physiologic calcifications of the basal ganglia bilaterally.  Mild cerebral and cerebellar atrophy. No definite evidence of acute intracranial hemorrhage, mass, mass effect, hydrocephalus or abnormal intra or extra-axial fluid collections.  Visualized paranasal sinuses and mastoids are well pneumatized.  No acute displaced skull fractures are noted.  IMPRESSION: 1.  Slightly ill-defined focus of low attenuation in the left frontal lobe periventricular white matter could represent an area of age indeterminate ischemia. 2.  Mild cerebral and cerebellar atrophy with old bilateral basal ganglia lacunar infarctions.  These results were called by telephone on 08/22/2012 at 08:50 p.m. to Dr. Roseanne Reno, who verbally acknowledged  these results.   Original Report Authenticated By: Trudie Reed, M.D.    Mr Brain Wo Contrast  08/23/2012  *RADIOLOGY REPORT*  Clinical Data:  Acute left-sided weakness which has resolved. Atrial fibrillation.  MRI BRAIN WITHOUT CONTRAST MRA HEAD WITHOUT CONTRAST  Technique: Multiplanar, multiecho pulse sequences of the brain and surrounding structures were obtained according to standard protocol without intravenous contrast.  Angiographic images of the head were obtained using MRA technique without contrast.  Comparison: 08/22/2012 head CT.  No comparison brain MR.  MRI HEAD  Findings:  No acute infarct.  Remote infarct left centrum semiovale/corona radiata. Tiny remote cerebellar infarct.  Small vessel disease type changes.  No intracranial hemorrhage.  No intracranial mass lesion detected on this unenhanced exam.  Global atrophy without hydrocephalus.  Mild transverse ligament hypertrophy.  Cervical medullary junction, pituitary region, pineal region and orbital structures unremarkable.  IMPRESSION: No acute infarct.  Please see above.  MRA HEAD  Findings: Anterior circulation without medium or large size vessel significant stenosis or occlusion.  Middle cerebral artery branch vessel irregularity bilaterally.  Left vertebral artery is dominant.  Mild smooth narrowing distal left vertebral artery.  Right vertebral artery predominately ends in a PICA distribution. Moderate tandem stenosis involving portions of the distal right vertebral artery and the right PICA.  Mild narrowing portions of the left PICA.  Mild narrowing and irregularity of the mid aspect of the basilar artery.  Tiny bulge of the right lateral aspect of the basilar artery without discrete saccular aneurysm.  This may be related to atherosclerotic type changes.  Nonvisualization of the left AICA.  Mild irregularity and areas of narrowing of portions of the superior cerebellar artery bilaterally and distal aspect of the posterior cerebral  artery more notable on the left.  Fetal type origin of the left posterior cerebral artery.  IMPRESSION: Intracranial atherosclerotic type changes predominately involving branch vessels and the right vertebral artery as noted above.   Original Report Authenticated By: Lacy Duverney, M.D.    Mr Cervical Spine W Wo Contrast  08/25/2012  *RADIOLOGY REPORT*  Clinical Data: 78 year old male with left side weakness.  No known neck trauma or surgery.  The patient presented as a Code stroke on 08/22/2012.  MRI CERVICAL SPINE WITHOUT AND WITH CONTRAST  Technique:  Multiplanar and multiecho pulse sequences of the cervical spine, to include the craniocervical junction and cervicothoracic junction, were obtained according to standard protocol without and with intravenous contrast.  Contrast: 15mL MULTIHANCE GADOBENATE DIMEGLUMINE 529 MG/ML IV SOLN  Comparison: Brain MRI 08/23/2012.  Findings: Cervical vertebral height and alignment within normal limits. No marrow edema or evidence of acute osseous abnormality. Cervicomedullary junction is within normal limits.  The spinal cord  at this C3 level is abnormal.  T1 and T2 images suggest mild volume loss of the cord at this level (series 3 image 7) with STIR and gradient images demonstrating severe increased signal, and loss of gray-white matter differentiation (series 6 image 6, series 5 image 7).   No associated enhancement.  No abnormal intradural enhancement.  No intramedullary or extramedullary mass is evident.  The signal abnormality extends caudally to the upper C4 level at the lateral left cord (to series 6 image 9). Elsewhere, cervical and upper thoracic spinal cord signal appears to be within normal limits (detail of the spinal cord is lost at C5-C6 in part due to spinal stenosis).  Visualized paraspinal soft tissues are within normal limits.  C2-C3:  Negative.  C3-C4:  Abnormal spinal cord, see above. Left eccentric mild circumferential disc osteophyte complex. Mild  ligament flavum hypertrophy.  Moderate facet hypertrophy.  No spinal stenosis.  Severe left greater than right C4 foraminal stenosis.  C4-C5:  Mild circumferential disc osteophyte complex.  Mild ligament flavum hypertrophy.  Moderate facet hypertrophy greater on the right.  Borderline to mild spinal stenosis.  Severe bilateral C5 foraminal stenosis.  C5-C6:  Moderate left eccentric circumferential disc osteophyte complex.  Mild ligament flavum hypertrophy.  Spinal stenosis. Minimal to mild spinal cord flattening.  Severe bilateral C6 foraminal stenosis.  C6-C7:  Uncovertebral hypertrophy and far lateral disc osteophyte complex on both sides.  Mild ligament flavum hypertrophy.  No spinal stenosis.  Moderate bilateral C7 foraminal stenosis.  C7-T1:  Mild facet hypertrophy on the left.  Otherwise negative.  Negative visualized upper thoracic levels.  IMPRESSION: 1.  Abnormal spinal cord at the C3 level with constellation of findings most compatible with nonspecific myelomalacia.  No spinal stenosis at this level to suggest a compressive etiology.  Perhaps this is related to transverse myelitis (no enhancement is present) or cord infarct.  Demyelinating disease would be favored in a younger patient.  However, the absence of cord expansion (and subtle evidence of loss of cord volume) is such that I would suspect this was more than 35 days old. Clinical correlation recommended. 2.  Multifactorial degenerative spinal stenosis at C4-C5 and C5-C6. Mild cord mass effect at these levels with no definite cord signal abnormality. 3.  Severe degenerative neural foraminal stenosis at the C4, C5, and C6 nerve levels.   Original Report Authenticated By: Erskine Speed, M.D.    Mr Mra Head/brain Wo Cm  08/23/2012  *RADIOLOGY REPORT*  Clinical Data:  Acute left-sided weakness which has resolved. Atrial fibrillation.  MRI BRAIN WITHOUT CONTRAST MRA HEAD WITHOUT CONTRAST  Technique: Multiplanar, multiecho pulse sequences of the brain  and surrounding structures were obtained according to standard protocol without intravenous contrast.  Angiographic images of the head were obtained using MRA technique without contrast.  Comparison: 08/22/2012 head CT.  No comparison brain MR.  MRI HEAD  Findings:  No acute infarct.  Remote infarct left centrum semiovale/corona radiata. Tiny remote cerebellar infarct.  Small vessel disease type changes.  No intracranial hemorrhage.  No intracranial mass lesion detected on this unenhanced exam.  Global atrophy without hydrocephalus.  Mild transverse ligament hypertrophy.  Cervical medullary junction, pituitary region, pineal region and orbital structures unremarkable.  IMPRESSION: No acute infarct.  Please see above.  MRA HEAD  Findings: Anterior circulation without medium or large size vessel significant stenosis or occlusion.  Middle cerebral artery branch vessel irregularity bilaterally.  Left vertebral artery is dominant.  Mild smooth narrowing distal left vertebral  artery.  Right vertebral artery predominately ends in a PICA distribution. Moderate tandem stenosis involving portions of the distal right vertebral artery and the right PICA.  Mild narrowing portions of the left PICA.  Mild narrowing and irregularity of the mid aspect of the basilar artery.  Tiny bulge of the right lateral aspect of the basilar artery without discrete saccular aneurysm.  This may be related to atherosclerotic type changes.  Nonvisualization of the left AICA.  Mild irregularity and areas of narrowing of portions of the superior cerebellar artery bilaterally and distal aspect of the posterior cerebral artery more notable on the left.  Fetal type origin of the left posterior cerebral artery.  IMPRESSION: Intracranial atherosclerotic type changes predominately involving branch vessels and the right vertebral artery as noted above.   Original Report Authenticated By: Lacy Duverney, M.D.    ASSESSMENT:  1. Elevated cardiac enzymes of ?  Etiology. His CPK and MB are elevated but the relative index is normal. The Troponin is only mildly elevated. This is most likely secondary to TIA/CVA. With a relative index the MB elevation does not appear to be cardiac in origin. He does give a history of some intermittent chest discomfort over the past few weeks but sounds like indigestion.  2. TIA with cerebrovascular atherosclerosis by MRI/MRA  3. Chronic atrial fibrillation - not a coumadin candidate secondary to fall risk - HR increased today  4. Normal LVF by echo  PLAN:  1. Continue Plavix/ASA/dig/lopressor  2. Given the findings on his MRI/MRA I am concerned about giving him Lexiscan for the nuclear stress test.  If his BP drops during the infusion he could have cerebral ischemia in the setting of atherosclerosis.  I would like to avoid dobutamine CL in setting of atrial fibrillation. He is not a good candidate for Coronary CTA due to underlying afib. Given recent neurologic event I think he is too high risk for cath and this was discussed with Neuro who agrees.   I would recommend discharging patient and I will set him up in 2-3 weeks for Stress Cardiolyte in our office.  His chest pain that he complained about as an outpatient sounds more GI in that is was relieved when he would belch and was similar to his indigestion symptoms in the past.     Quintella Reichert, MD  08/25/2012  4:03 PM

## 2012-08-25 NOTE — Progress Notes (Signed)
PT Cancellation Note  Patient Details Name: Woodward Klem MRN: [id number] DOB: 05/02/1924   Cancelled Treatment:    Reason Eval/Treat Not Completed: Patient at procedure or test/unavailable.  Pt going for MRI.   Will f/u tomorrow.      Verdell Face, [place] [phone] 08/25/2012

## 2012-08-25 NOTE — Progress Notes (Signed)
Stroke Team Progress Note  HISTORY Erik Arnold is an 78 y.o. male with a history of atrial fibrillation, not on anticoagulation, presenting with acute weakness involving left extremities. Onset of symptoms was at 5 PM 08/22/2012. Has no previous documentation of stroke nor TIA. CT scan showed an area of hypoattenuation involving the left frontal region indicative of probable old stroke. There were no acute changes. Patient's deficits improved in route to the emergency room. NIH stroke score following arrival was 2. He was noted TPA candidate because of rapidly resolving deficits and patient was beyond time window for treatment consideration with intravenous thrombolytic therapy. He's been taking aspirin 81 mg per day. Patient was not a TPA candidate secondary to Rapidly resolving deficits and beyond time window for treatment consideration. He was admitted for further evaluation and treatment.  SUBJECTIVE NPO. No new complaints. Stress test today.  OBJECTIVE Most recent Vital Signs: Filed Vitals:   08/24/12 1800 08/24/12 2200 08/25/12 0200 08/25/12 0600  BP: 108/68 116/76 119/41 113/52  Pulse: 80 89 93 88  Temp: 98.5 F (36.9 C) 98.1 F (36.7 C) 98.5 F (36.9 C) 97.9 F (36.6 C)  TempSrc: Oral Oral Oral Oral  Resp: 18 18 18 18   Height:      Weight:      SpO2: 100% 99% 98% 98%   IV Fluid Intake:      . sodium chloride 75 mL/hr at 08/25/12 0113  . heparin 1,000 Units/hr (08/24/12 1922)   MEDICATIONS     . digoxin  0.125 mg Oral Daily   PRN:    Diet:  NPO thin liquids Activity:  OOB DVT Prophylaxis:  Heparin 5000 units sq tid  CLINICALLY SIGNIFICANT STUDIES Basic Metabolic Panel:   Lab 08/25/12 0424 08/23/12 0022  NA 138 133*  K 4.2 4.5  CL 108 100  CO2 21 24  GLUCOSE 89 90  BUN 21 24*  CREATININE 0.70 0.86  CALCIUM 7.8* 9.2  MG -- --  PHOS -- --   Liver Function Tests:   Lab 08/25/12 0424 08/23/12 0022  AST 31 30  ALT 15 13  ALKPHOS 58 60  BILITOT 0.3 0.7   PROT 5.7* 6.7  ALBUMIN 2.3* 3.1*   CBC:   Lab 08/25/12 0424 08/23/12 0022  WBC 9.0 14.6*  NEUTROABS -- 11.3*  HGB 11.6* 12.9*  HCT 32.7* 36.3*  MCV 106.5* 107.1*  PLT 199 193   Coagulation:   Lab 08/23/12 0022  LABPROT 14.1  INR 1.10   Cardiac Enzymes:   Lab 08/24/12 0824 08/23/12 2131 08/23/12 1522 08/23/12 0915 08/23/12 0022  CKTOTAL 1022* -- -- 1031* 712*  CKMB 6.1* -- -- 9.4* 8.7*  CKMBINDEX -- -- -- -- --  TROPONINI -- 0.35* <0.30 0.37* --   Urinalysis: No results found for this basename: COLORURINE:2,APPERANCEUR:2,LABSPEC:2,PHURINE:2,GLUCOSEU:2,HGBUR:2,BILIRUBINUR:2,KETONESUR:2,PROTEINUR:2,UROBILINOGEN:2,NITRITE:2,LEUKOCYTESUR:2 in the last 168 hours  Lipid Panel     Component Value Date/Time   CHOL 129 08/23/2012 0915   TRIG 55 08/23/2012 0915   HDL 44 08/23/2012 0915   CHOLHDL 2.9 08/23/2012 0915   VLDL 11 08/23/2012 0915   LDLCALC 74 08/23/2012 0915   HgbA1C  Lab Results  Component Value Date   HGBA1C 5.0 08/23/2012    Urine Drug Screen:     Component Value Date/Time   LABOPIA NONE DETECTED 08/22/2012 2230   COCAINSCRNUR NONE DETECTED 08/22/2012 2230   LABBENZ NONE DETECTED 08/22/2012 2230   AMPHETMU NONE DETECTED 08/22/2012 2230   THCU NONE DETECTED 08/22/2012 2230   LABBARB NONE  DETECTED 08/22/2012 2230    Alcohol Level: No results found for this basename: ETH:2 in the last 168 hours  CT of the brain  08/22/2012   1.  Slightly ill-defined focus of low attenuation in the left frontal lobe periventricular white matter could represent an area of age indeterminate ischemia. 2.  Mild cerebral and cerebellar atrophy with old bilateral basal ganglia lacunar infarctions.  MRI of the brain  08/23/2012  No acute infarct.  MRA of the brain  08/23/2012  Intracranial atherosclerotic type changes predominately involving branch vessels and the right vertebral artery.  2D Echocardiogram  08/23/2012 EF 55-60% with no source of embolus.   Carotid Doppler   08/23/2012 Carotid duplex demonstrates minimal plaque with out ICA stenosis bilaterally. Vertebral artery flow antegrade.  CXR    EKG     Therapy Recommendations SNF  GENERAL EXAM:  Patient is in no distress  CARDIOVASCULAR:  Regular rate and rhythm, no murmurs, no carotid bruits  NEUROLOGIC:  MENTAL STATUS: awake, alert, language fluent, comprehension intact, naming intact  CRANIAL NERVE: pupils equal and reactive to light, visual fields full to confrontation, extraocular muscles intact, no nystagmus, facial sensation and strength symmetric, uvula midline, shoulder shrug symmetric, tongue midline.  MOTOR: LUE AND LLE (3-4/5).  SENSORY: normal and symmetric to light touch  COORDINATION: DECR IN LUE TO FFM.  REFLEXES: deep tendon reflexes TRACE and symmetric  ASSESSMENT [name] is a 78 y.o. male presenting with left sided weakness. No acute infarct seen on MRI. Dx: right brain TIA. TIA felt to be embolic secondary to atrial fibrillation.  Has not been a coumadin candidate in the past due to risk of fall. Work up completed. On aspirin 81 mg orally every day prior to admission. Now on aspirin 81 mg orally every day, clopidogrel 75 mg orally every day and heparin for secondary stroke prevention. Patient with ongoing left hemiparesis arm and leg accompanied by bladder leaking. ? Etiology of ongoing weakness  atrial fibrillation, not a coumadin candidate in the past secondary to fall risk. New lopressor for HR control Elevated cardiac enzymes. Dr. Mayford Knife following. On IV heparin  LDL 74 HgbA1c 5.0  Hospital day # 3  TREATMENT/PLAN  Long term treatment recommendations are for anticoagulation for secondary stroke prevention. Will stop aspirin 81 mg orally every day and clopidogrel 75 mg orally every day given current IV heparin drip. Felt to be fall risk for coumadin. Will need plavix long term due to neurological event,  add aspirin if indicated from a cardiac standpoint as patient is  undergoing stress test today. May discontinue IV heparin from cardiac standpoint.  SNF recommended by therapy.  Stroke will sign off. Please have patient make appointment to see Dr. Pearlean Brownie in 2 months.  Gwendolyn Lima. Manson Passey, [facility], MBA, MHA [facility] Pager: [phone] 08/25/2012 8:07 AM  I have personally obtained a history, examined the patient, evaluated imaging results, and formulated the assessment and plan of care. I agree with the above.  Delia Heady, MD Medical Director [facility] Pager: [phone] 08/25/2012 6:33 PM

## 2012-08-25 NOTE — Progress Notes (Signed)
The cardiologist Dr T. Turner came to see pt this pm and gave an order to discontinue heparin and  Pt may be discharge and see  Md in 2 weeks  Dr Susie Cassette made aware  Heparin discontinue at 1645. Awaiting  For discharged to a skill facility.

## 2012-08-26 NOTE — Progress Notes (Signed)
Pt being discharge to [facility] a skill nursing facility for rehab ,  .AVS given to pt  Copy on chart to receiving facility  . Vital signs BP 110/48 , HR  85 Temp 97.0 F resp 18 Oxygen saturation 97% room air  and stable  . Report called to [facility] . Spoke with MS Amy Phatch  the receiving  Nurse. Condition stable.  Pt awaiting transport for pick up.

## 2012-08-26 NOTE — Progress Notes (Signed)
Physical Therapy Treatment Patient Details Name: Erik Arnold MRN: [id number] DOB: 04-11-1924 Today's Date: 08/26/2012 Time: 4098-1191 PT Time Calculation (min): 24 min  PT Assessment / Plan / Recommendation Comments on Treatment Session  Pt continues to make progress towards PT goals.  Patient demonstrates some unsteadiness with ambulation; continue to rec SNF upon discharge.    Follow Up Recommendations  SNF           Equipment Recommendations  Rolling walker with 5" wheels    Recommendations for Other Services    Frequency Min 3X/week   Plan      Precautions / Restrictions Precautions Precautions: Fall   Pertinent Vitals/Pain Patient reports no new complaints at this time.    Mobility  Bed Mobility Bed Mobility: Supine to Sit;Sitting - Scoot to Edge of Bed Supine to Sit: 5: Supervision;HOB flat Sitting - Scoot to Edge of Bed: 5: Supervision Details for Bed Mobility Assistance: Increased time Transfers Transfers: Sit to Stand;Stand to Sit Sit to Stand: 4: Min guard;With upper extremity assist;From bed Stand to Sit: 4: Min guard;With upper extremity assist;With armrests;To chair/3-in-1 Details for Transfer Assistance: VCs for safe hand placement Ambulation/Gait Ambulation/Gait Assistance: 4: Min guard Ambulation Distance (Feet): 70 Feet Assistive device: Rolling walker Ambulation/Gait Assistance Details: vc's for proper use of device; staying within rw and hand placement on rw. Gait Pattern: Step-through pattern;Decreased stride length;Left steppage;Trunk flexed;Narrow base of support Gait velocity: decreased General Gait Details: pt mildly unsteady with ambulation secondary to weakness on left side     PT Goals Acute Rehab PT Goals PT Goal Formulation: With patient Time For Goal Achievement: 08/30/12 Potential to Achieve Goals: Fair Pt will Stand: with modified independence PT Goal: Stand - Progress: Progressing toward goal Pt will Ambulate: >150 feet;with  modified independence PT Goal: Ambulate - Progress: Progressing toward goal Pt will Go Up / Down Stairs: 1-2 stairs;with modified independence  Visit Information  Last PT Received On: 08/26/12 Assistance Needed: +1    Subjective Data  Subjective: I can't get up by myself so I haven't done much walking Patient Stated Goal: to get better   Cognition  Overall Cognitive Status: Appears within functional limits for tasks assessed/performed Arousal/Alertness: Awake/alert Orientation Level: Appears intact for tasks assessed Behavior During Session: [facility] for tasks performed    Balance  Balance Balance Assessed: Yes (patient steady with static standing; unsteady with reaching)  End of Session PT - End of Session Equipment Utilized During Treatment: Gait belt Activity Tolerance: Patient tolerated treatment well Patient left: in chair;with call bell/phone within reach Nurse Communication: Mobility status   GP     Fabio Asa 08/26/2012, 9:59 AM

## 2012-08-26 NOTE — Clinical Social Work Note (Signed)
Clinical Social Work   Pt is ready for discharge today to [facility]. Facility has received discharge summary and is ready to accept pt. Pt is aware of fees that he is a private pay, and pt is agreeable. Pt and pt's sister are agreeable to discharge plan. [facility] will provide transportation to facility. CSW is signing off as no further needs identified.   Dede Query, MSW, Theresia Majors [phone]

## 2012-08-26 NOTE — Progress Notes (Signed)
Pt picked up by  [facility] [employer] now enroute to [facility] condition stable

## 2012-08-26 NOTE — Progress Notes (Signed)
Physician Discharge Summary  Erik Arnold  MRN: [id number]  DOB/AGE: [birth year] 78 y.o.  PCP: Quintella Reichert, MD  Admit date: 08/22/2012  Discharge date: 08/26/2012     Discharge Diagnoses:  TIA (transient ischemic attack)  Atrial fibrillation  Cardiac enzymes elevated  Chest pain     Medication List      As of 08/25/2012 7:46 AM     TAKE these medications        aspirin 81 MG EC tablet     Take 1 tablet (81 mg total) by mouth daily. Swallow whole.     clopidogrel 75 MG tablet     Commonly known as: PLAVIX     Take 1 tablet (75 mg total) by mouth daily.     digoxin 0.125 MG tablet     Commonly known as: LANOXIN     Take 0.125 mg by mouth daily.     metoprolol tartrate 25 MG tablet     Commonly known as: LOPRESSOR     Take 12.5 mg by mouth 2 (two) times daily.      Discharge Condition: Stable  Disposition:  Consults:  #1 cardiology  #2 neurology  Significant Diagnostic Studies:  Ct Head Wo Contrast  08/22/2012 *RADIOLOGY REPORT* Clinical Data: History of fall and severe weakness on the left side. Code stroke. CT HEAD WITHOUT CONTRAST Technique: Contiguous axial images were obtained from the base of the skull through the vertex without contrast. Comparison: No priors. Findings: Focal area of low attenuation in the left frontal periventricular white matter (images 21 and 22 of series 2). Focus of low attenuation in the left putamen is compatible with an old lacunar infarction. A similar finding is present in the inferior aspect of the right putamen. Physiologic calcifications of the basal ganglia bilaterally. Mild cerebral and cerebellar atrophy. No definite evidence of acute intracranial hemorrhage, mass, mass effect, hydrocephalus or abnormal intra or extra-axial fluid collections. Visualized paranasal sinuses and mastoids are well pneumatized. No acute displaced skull fractures are noted. IMPRESSION: 1. Slightly ill-defined focus of low attenuation in the left frontal lobe  periventricular white matter could represent an area of age indeterminate ischemia. 2. Mild cerebral and cerebellar atrophy with old bilateral basal ganglia lacunar infarctions. These results were called by telephone on 08/22/2012 at 08:50 p.m. to Dr. Roseanne Reno, who verbally acknowledged these results. Original Report Authenticated By: Trudie Reed, M.D.  Mr Brain Wo Contrast  08/23/2012 *RADIOLOGY REPORT* Clinical Data: Acute left-sided weakness which has resolved. Atrial fibrillation. MRI BRAIN WITHOUT CONTRAST MRA HEAD WITHOUT CONTRAST Technique: Multiplanar, multiecho pulse sequences of the brain and surrounding structures were obtained according to standard protocol without intravenous contrast. Angiographic images of the head were obtained using MRA technique without contrast. Comparison: 08/22/2012 head CT. No comparison brain MR. MRI HEAD Findings: No acute infarct. Remote infarct left centrum semiovale/corona radiata. Tiny remote cerebellar infarct. Small vessel disease type changes. No intracranial hemorrhage. No intracranial mass lesion detected on this unenhanced exam. Global atrophy without hydrocephalus. Mild transverse ligament hypertrophy. Cervical medullary junction, pituitary region, pineal region and orbital structures unremarkable. IMPRESSION: No acute infarct. Please see above. MRA HEAD Findings: Anterior circulation without medium or large size vessel significant stenosis or occlusion. Middle cerebral artery branch vessel irregularity bilaterally. Left vertebral artery is dominant. Mild smooth narrowing distal left vertebral artery. Right vertebral artery predominately ends in a PICA distribution. Moderate tandem stenosis involving portions of the distal right vertebral artery and the right PICA. Mild narrowing portions of the left PICA.  Mild narrowing and irregularity of the mid aspect of the basilar artery. Tiny bulge of the right lateral aspect of the basilar artery without discrete  saccular aneurysm. This may be related to atherosclerotic type changes. Nonvisualization of the left AICA. Mild irregularity and areas of narrowing of portions of the superior cerebellar artery bilaterally and distal aspect of the posterior cerebral artery more notable on the left. Fetal type origin of the left posterior cerebral artery. IMPRESSION: Intracranial atherosclerotic type changes predominately involving branch vessels and the right vertebral artery as noted above. Original Report Authenticated By: Lacy Duverney, M.D.  Mr Mra Head/brain Wo Cm  08/23/2012 *RADIOLOGY REPORT* Clinical Data: Acute left-sided weakness which has resolved. Atrial fibrillation. MRI BRAIN WITHOUT CONTRAST MRA HEAD WITHOUT CONTRAST Technique: Multiplanar, multiecho pulse sequences of the brain and surrounding structures were obtained according to standard protocol without intravenous contrast. Angiographic images of the head were obtained using MRA technique without contrast. Comparison: 08/22/2012 head CT. No comparison brain MR. MRI HEAD Findings: No acute infarct. Remote infarct left centrum semiovale/corona radiata. Tiny remote cerebellar infarct. Small vessel disease type changes. No intracranial hemorrhage. No intracranial mass lesion detected on this unenhanced exam. Global atrophy without hydrocephalus. Mild transverse ligament hypertrophy. Cervical medullary junction, pituitary region, pineal region and orbital structures unremarkable. IMPRESSION: No acute infarct. Please see above. MRA HEAD Findings: Anterior circulation without medium or large size vessel significant stenosis or occlusion. Middle cerebral artery branch vessel irregularity bilaterally. Left vertebral artery is dominant. Mild smooth narrowing distal left vertebral artery. Right vertebral artery predominately ends in a PICA distribution. Moderate tandem stenosis involving portions of the distal right vertebral artery and the right PICA. Mild narrowing  portions of the left PICA. Mild narrowing and irregularity of the mid aspect of the basilar artery. Tiny bulge of the right lateral aspect of the basilar artery without discrete saccular aneurysm. This may be related to atherosclerotic type changes. Nonvisualization of the left AICA. Mild irregularity and areas of narrowing of portions of the superior cerebellar artery bilaterally and distal aspect of the posterior cerebral artery more notable on the left. Fetal type origin of the left posterior cerebral artery. IMPRESSION: Intracranial atherosclerotic type changes predominately involving branch vessels and the right vertebral artery as noted above. Original Report Authenticated By: Lacy Duverney, M.D.     2-D echo  LV EF: 55% - 60%  ------------------------------------------------------------ Indications: TIA 435.9.  ------------------------------------------------------------ History: PMH: Chest pain. Atrial fibrillation.  ------------------------------------------------------------ Study Conclusions  - Left ventricle: The cavity size was normal. There was mild concentric hypertrophy. Systolic function was normal. The estimated ejection fraction was in the range of 55% to 60%. Wall motion was normal; there were no regional wall motion abnormalities. - Aortic valve: Moderate diffuse thickening and calcification. Moderate regurgitation. - Mitral valve: Calcified annulus. Mild regurgitation. - Left atrium: The atrium was moderately dilated. - Right ventricle: The cavity size was moderately dilated. - Right atrium: The atrium was moderately dilated. - Atrial septum: No defect or patent foramen ovale was identified. - Pulmonary arteries: PA peak pressure: 35mm Hg (S). Impressions:  - The right ventricular systolic pressure was increased consistent with mild pulmonary hypertension.  Microbiology:  No results found for this or any previous visit (from the past 240 hour(s)).  Labs:    Results for orders placed during the hospital encounter of 08/22/12 (from the past 48 hour(s))   LIPID PANEL Status: Normal    Collection Time    08/23/12 9:15 AM   Component  Value  Range  Comment    Cholesterol  129  0 - 200 mg/dL     Triglycerides  55  <150 mg/dL     HDL  44  >45 mg/dL     Total CHOL/HDL Ratio  2.9      VLDL  11  0 - 40 mg/dL     LDL Cholesterol  74  0 - 99 mg/dL    TROPONIN I Status: Abnormal    Collection Time    08/23/12 9:15 AM   Component  Value  Range  Comment    Troponin I  0.37 (*)  <0.30 ng/mL    CK TOTAL AND CKMB Status: Abnormal    Collection Time    08/23/12 9:15 AM   Component  Value  Range  Comment    Total CK  1031 (*)  7 - 232 U/L     CK, MB  9.4 (*)  0.3 - 4.0 ng/mL  CRITICAL VALUE NOTED. VALUE IS CONSISTENT WITH PREVIOUSLY REPORTED AND CALLED VALUE.    Relative Index  0.9  0.0 - 2.5    TROPONIN I Status: Normal    Collection Time    08/23/12 3:22 PM   Component  Value  Range  Comment    Troponin I  <0.30  <0.30 ng/mL    TROPONIN I Status: Abnormal    Collection Time    08/23/12 9:31 PM   Component  Value  Range  Comment    Troponin I  0.35 (*)  <0.30 ng/mL    CK TOTAL AND CKMB Status: Abnormal    Collection Time    08/24/12 8:24 AM   Component  Value  Range  Comment    Total CK  1022 (*)  7 - 232 U/L     CK, MB  6.1 (*)  0.3 - 4.0 ng/mL  CRITICAL VALUE NOTED. VALUE IS CONSISTENT WITH PREVIOUSLY REPORTED AND CALLED VALUE.    Relative Index  0.6  0.0 - 2.5    TSH Status: Normal    Collection Time    08/24/12 8:24 AM   Component  Value  Range  Comment    TSH  3.171  0.350 - 4.500 uIU/mL    VITAMIN B12 Status: Normal    Collection Time    08/24/12 8:24 AM   Component  Value  Range  Comment    Vitamin B-12  365  211 - 911 pg/mL    HEPARIN LEVEL (UNFRACTIONATED) Status: Abnormal    Collection Time    08/24/12 6:16 PM   Component  Value  Range  Comment    Heparin Unfractionated  0.22 (*)  0.30 - 0.70 IU/mL    COMPREHENSIVE  METABOLIC PANEL Status: Abnormal    Collection Time    08/25/12 4:24 AM   Component  Value  Range  Comment    Sodium  138  135 - 145 mEq/L     Potassium  4.2  3.5 - 5.1 mEq/L     Chloride  108  96 - 112 mEq/L     CO2  21  19 - 32 mEq/L     Glucose, Bld  89  70 - 99 mg/dL     BUN  21  6 - 23 mg/dL     Creatinine, Ser  4.09  0.50 - 1.35 mg/dL     Calcium  7.8 (*)  8.4 - 10.5 mg/dL     Total Protein  5.7 (*)  6.0 - 8.3 g/dL  Albumin  2.3 (*)  3.5 - 5.2 g/dL     AST  31  0 - 37 U/L     ALT  15  0 - 53 U/L     Alkaline Phosphatase  58  39 - 117 U/L     Total Bilirubin  0.3  0.3 - 1.2 mg/dL     GFR calc non Af Amer  83 (*)  >90 mL/min     GFR calc Af Amer  >90  >90 mL/min    CBC Status: Abnormal    Collection Time    08/25/12 4:24 AM   Component  Value  Range  Comment    WBC  9.0  4.0 - 10.5 K/uL     RBC  3.07 (*)  4.22 - 5.81 MIL/uL     Hemoglobin  11.6 (*)  13.0 - 17.0 g/dL     HCT  96.0 (*)  45.4 - 52.0 %     MCV  106.5 (*)  78.0 - 100.0 fL     MCH  37.8 (*)  26.0 - 34.0 pg     MCHC  35.5  30.0 - 36.0 g/dL     RDW  09.8  11.9 - 14.7 %     Platelets  199  150 - 400 K/uL    HEPARIN LEVEL (UNFRACTIONATED) Status: Abnormal    Collection Time    08/25/12 4:24 AM   Component  Value  Range  Comment    Heparin Unfractionated  0.28 (*)  0.30 - 0.70 IU/mL     HPI :  78 y.o. male with a history of atrial fibrillation, not on anticoagulation, presenting with acute weakness involving left extremities. Onset of symptoms was at 5 PM 08/22/2012. Has no previous documentation of stroke nor TIA. CT scan showed an area of hypoattenuation involving the left frontal region indicative of probable old stroke. There were no acute changes. Patient's deficits improved in route to the emergency room. NIH stroke score following arrival was 2. He was noted not to be a TPA candidate because of rapidly resolving deficits and patient was beyond time window for treatment consideration with intravenous  thrombolytic therapy. He's been taking aspirin 81 mg per day. Patient was not a TPA candidate secondary to Rapidly resolving deficits and beyond time window for treatment consideration. He was admitted for further evaluation and treatment.   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 No spinal  stenosis at this level to suggest a compressive etiology MRI of the C-spine results as above  Given his history of atrial fibrillation neurology recommends long-term anticoagulation for secondary stroke prevention  However given his fall risk he is deemed not to be a candidate for long-term anticoagulation  Will continue aspirin and Plavix at home  appointment to see Dr. Pearlean Brownie in 2 months Cardiology recommends discharging patient and  in 2-3 weeks for Stress Cardiolyte in there  office.   #2 abnormal cardiac enzymes  Started on IV heparin drip based on cardiology recommendations,  His CPK and MB are elevated but the relative index is normal. The Troponin is only mildly elevated.  Seen by cardiology Dr. Carolanne Grumbling  Nuclear stress test today, if negative the patient will discharge    #3 atrial fibrillation  Continue beta blocker  Not a candidate for full dose anticoagulation    Discharge Exam:  Blood pressure 113/52, pulse 88, temperature 97.9 F (36.6 C), temperature source Oral, resp. rate 18, height 5\' 8"  (1.727 m), weight 76.2 kg (167 lb 15.9 oz), SpO2 98.00%.  General: Well developed, well nourished, in no acute distress  Head: Eyes PERRLA, No xanthomas. Normal cephalic and atramatic  Lungs: Clear bilaterally to auscultation and percussion.  Heart: Irregularly irregular S1 S2 Pulses are 2+ & equal.  Abdomen: Bowel sounds are positive, abdomen  soft and non-tender without masses  Extremities: No clubbing, cyanosis or edema. DP +1  Neuro: Alert and oriented X 3.  Psych: Good affect, responds appropriately    Signed:  Keghan Mcfarren  08/25/2012, 7:46 AM

## 2012-08-26 NOTE — Progress Notes (Signed)
Occupational Therapy Treatment Patient Details Name: Erik Arnold MRN: [id number] DOB: 1924-01-07 Today's Date: 08/26/2012 Time: 4098-1191 OT Time Calculation (min): 15 min  OT Assessment / Plan / Recommendation Comments on Treatment Session Pt progressing well with therapy. Will continue to follow acutely and recommend SNF for d/c plan.    Follow Up Recommendations  SNF    Barriers to Discharge       Equipment Recommendations  3 in 1 bedside comode    Recommendations for Other Services    Frequency Min 3X/week   Plan Discharge plan remains appropriate    Precautions / Restrictions Precautions Precautions: Fall   Pertinent Vitals/Pain Pt with no c/o pain    ADL  Grooming: Wash/dry hands;Min guard Where Assessed - Grooming: Unsupported standing Lower Body Dressing: Moderate assistance Where Assessed - Lower Body Dressing: Unsupported sit to stand Toilet Transfer: Min Pension scheme manager Method: Sit to Barista: Bedside commode Toileting - Clothing Manipulation and Hygiene: Min guard Where Assessed - Engineer, mining and Hygiene: Standing Equipment Used: Rolling walker;Gait belt Transfers/Ambulation Related to ADLs: Min guard A with RW ambulation throughout room ADL Comments: Pt with premorbidly weak LLE and was able to demonstrate his adaptive technique for LB dressing    OT Diagnosis:    OT Problem List:   OT Treatment Interventions:     OT Goals Acute Rehab OT Goals OT Goal Formulation: With patient ADL Goals Pt Will Perform Grooming: Unsupported;with set-up;Standing at sink ADL Goal: Grooming - Progress: Progressing toward goals Pt Will Perform Upper Body Bathing: with set-up;with supervision;Unsupported;Sitting, edge of bed;Sitting, chair Pt Will Perform Lower Body Bathing: with min assist;Unsupported;Sit to stand from bed;Sit to stand from chair Pt Will Perform Upper Body Dressing: with min assist;Unsupported;Sit to stand  from bed;Sit to stand from chair Pt Will Perform Lower Body Dressing: with mod assist;Unsupported;Sit to stand from bed;Sit to stand from chair ADL Goal: Lower Body Dressing - Progress: Progressing toward goals Pt Will Transfer to Toilet: Ambulation;with DME;Comfort height toilet;Grab bars ADL Goal: Toilet Transfer - Progress: Progressing toward goals Pt Will Perform Toileting - Hygiene: Sit to stand from 3-in-1/toilet ADL Goal: Toileting - Hygiene - Progress: Progressing toward goals Miscellaneous OT Goals Miscellaneous OT Goal #1: Pt will be Supervision for LUE GM/FM activities and exercises.  Visit Information  Last OT Received On: 08/26/12 Assistance Needed: +1    Subjective Data      Prior Functioning       Cognition  Overall Cognitive Status: Appears within functional limits for tasks assessed/performed Arousal/Alertness: Awake/alert Orientation Level: Appears intact for tasks assessed Behavior During Session: [facility] for tasks performed    Mobility  Shoulder Instructions         Exercises      Balance     End of Session OT - End of Session Equipment Utilized During Treatment: Gait belt Activity Tolerance: Patient tolerated treatment well Patient left: in chair;with call bell/phone within reach Nurse Communication: Mobility status  GO Self Care Discharge Status [phone]): At least 20 percent but less than 40 percent impaired, limited or restricted   Erik Arnold 08/26/2012, 3:27 PM

## 2012-08-26 NOTE — Clinical Social Work Placement (Signed)
    Clinical Social Work Department CLINICAL SOCIAL WORK PLACEMENT NOTE 08/26/2012  Patient:  Erik Arnold, Erik Arnold  Account Number:  [id number] Admit date:  08/22/2012  Clinical Social Worker:  Peggyann Shoals  Date/time:  08/25/2012 04:30 PM  Clinical Social Work is seeking post-discharge placement for this patient at the following level of care:   SKILLED NURSING   (*CSW will update this form in Epic as items are completed)   08/25/2012  Patient/family provided with [facility] Department of Clinical Social Work's list of facilities offering this level of care within the geographic area requested by the patient (or if unable, by the patient's family).  08/25/2012  Patient/family informed of their freedom to choose among providers that offer the needed level of care, that participate in Medicare, Medicaid or managed care program needed by the patient, have an available bed and are willing to accept the patient.  08/25/2012  Patient/family informed of [facility]' ownership interest in [facility], as well as of the fact that they are under no obligation to receive care at this facility.  PASARR submitted to EDS on 08/26/2012 PASARR number received from [employer] on 08/26/2012  FL2 transmitted to all facilities in geographic area requested by pt/family on  08/25/2012 FL2 transmitted to all facilities within larger geographic area on   Patient informed that his/her managed care company has contracts with or will negotiate with  certain facilities, including the following:     Patient/family informed of bed offers received:  08/26/2012 Patient chooses bed at [facility]  Physician recommends and patient chooses bed at  SNF  Patient to be transferred to [facility] Patient to be transferred to facility by [facility]  The following physician request were entered in Epic:   Additional Comments:

## 2012-08-26 NOTE — Clinical Social Work Psychosocial (Signed)
     Clinical Social Work Department BRIEF PSYCHOSOCIAL ASSESSMENT 08/26/2012  Patient:  Erik Arnold, Erik Arnold     Account Number:  [id number]     Admit date:  08/22/2012  Clinical Social Worker:  Peggyann Shoals  Date/Time:  08/25/2012 04:26 PM  Referred by:  Physician  Date Referred:  08/25/2012 Referred for  SNF Placement   Other Referral:   Interview type:  Patient Other interview type:    PSYCHOSOCIAL DATA Living Status:  ALONE Admitted from facility:   Level of care:   Primary support name:  Rae Lips (sister) Primary support relationship to patient:  SIBLING Degree of support available:   supportive.    CURRENT CONCERNS Current Concerns  Post-Acute Placement   Other Concerns:    SOCIAL WORK ASSESSMENT / PLAN CSW met with pt to address consult for SNF. CSW introduced herself and explained the role of clinical social work. CSW also explained the role of social work. Per RNCM, pt has not had a 3 night qualifying stay, therefore would be considered private pay to go to SNF. CSW explained in detail the process of paying privately at the SNF. Pt shared that he lives alone and is agreeable to SNF. CSW also spoke with pt's sister (and [place]) to address discharge plan. Pt's sister shared that she spoke with pt and both are agreeable to SNF.    CSW will initiate SNF search and follow up with bed offers. CSW will continue to follow.   Assessment/plan status:  Psychosocial Support/Ongoing Assessment of Needs Other assessment/ plan:   Information/referral to community resources:   SNF list    PATIENTS/FAMILYS RESPONSE TO PLAN OF CARE: Pt was alert and oriented. Pt is agreeable to SNF placement and paying privately.

## 2013-07-06 ENCOUNTER — Encounter: Payer: Self-pay | Admitting: Cardiology

## 2013-09-25 ENCOUNTER — Encounter: Payer: Self-pay | Admitting: Cardiology

## 2013-09-25 ENCOUNTER — Encounter: Payer: Self-pay | Admitting: *Deleted

## 2013-09-29 ENCOUNTER — Ambulatory Visit: Payer: Medicare Other | Admitting: Cardiology

## 2013-10-02 ENCOUNTER — Ambulatory Visit (INDEPENDENT_AMBULATORY_CARE_PROVIDER_SITE_OTHER): Payer: Medicare Other | Admitting: Cardiology

## 2013-10-02 ENCOUNTER — Encounter: Payer: Self-pay | Admitting: Cardiology

## 2013-10-02 VITALS — BP 136/60 | HR 63 | Ht 68.0 in | Wt 169.0 lb

## 2013-10-02 DIAGNOSIS — I482 Chronic atrial fibrillation, unspecified: Secondary | ICD-10-CM

## 2013-10-02 DIAGNOSIS — I4891 Unspecified atrial fibrillation: Secondary | ICD-10-CM

## 2013-10-02 MED ORDER — CLOPIDOGREL BISULFATE 75 MG PO TABS: 75.0000 mg | ORAL_TABLET | Freq: Every day | ORAL | Status: DC

## 2013-10-02 NOTE — Patient Instructions (Signed)
Your physician recommends that you continue on your current medications as directed. Please refer to the Current Medication list given to you today.  I sent a refill of Plavix into your pharmacy for you  Your physician wants you to follow-up in: 6 Months with Dr Sherlyn Lick will receive a reminder letter in the mail two months in advance. If you don't receive a letter, please call our office to schedule the follow-up appointment.

## 2013-10-02 NOTE — Progress Notes (Signed)
  [address] [place], [place]  [address] Phone: [phone] Fax:  [phone]  Date:  10/02/2013   ID:  Erik Arnold, DOB 11/04/1923, MRN [id number]  PCP:  Quintella Reichert, MD  Cardiologist:  Armanda Magic, MD     History of Present Illness: Erik Arnold is a 80 y.o. male with a history of chronic atrial fibrillation not on chronic anticoagulation due to high fall risk who presents today for followup.  He is doing well today.  He denies any chest pain, SOB, DOE, LE edema, dizziness, palpitations or syncope.     Wt Readings from Last 3 Encounters:  08/23/12 167 lb 15.9 oz (76.2 kg)     Past Medical History  Diagnosis Date  . AF (atrial fibrillation)     Not on coumadin as he was deemed a fall risk after he fell in his cardiologists office.  . Stroke 08/22/2012    residual left sided weakness (08/23/2012)  . TIA (transient ischemic attack)   . Carpal tunnel syndrome     diagnosed in 1997    Current Outpatient Prescriptions  Medication Sig Dispense Refill  . aspirin 81 MG EC tablet Take 1 tablet (81 mg total) by mouth daily. Swallow whole.  30 tablet  12  . clopidogrel (PLAVIX) 75 MG tablet Take 1 tablet (75 mg total) by mouth daily.  30 tablet  0  . digoxin (LANOXIN) 0.125 MG tablet Take 0.125 mg by mouth daily.      . metoprolol tartrate (LOPRESSOR) 25 MG tablet Take 12.5 mg by mouth 2 (two) times daily.       No current facility-administered medications for this visit.    Allergies:   No Known Allergies  Social History:  The patient  reports that he has quit smoking. His smoking use included Pipe. He has quit using smokeless tobacco. His smokeless tobacco use included Chew. He reports that he does not drink alcohol or use illicit drugs.   Family History:  The patient's family history is not on file.   ROS:  Please see the history of present illness.       All other systems reviewed and negative.   PHYSICAL EXAM: VS:  There were no vitals taken for this  visit. Well nourished, well developed, in no acute distress HEENT: normal Neck: no JVD Cardiac:  normal S1, S2; irregularly irregular,  no murmur Lungs:  clear to auscultation bilaterally, no wheezing, rhonchi or rales Abd: soft, nontender, no hepatomegaly Ext: no edema Skin: warm and dry Neuro:  CNs 2-12 intact, no focal abnormalities noted  EKG:  Atrial fibrillation with CVR     ASSESSMENT AND PLAN:  1. Chronic atrial fibrillation rate controlled- not a long term anticoagulation candidate due to fall risk.    - continue Plavix/dig/metoprolol/ASA  - check dig level  Followup with me in 6 months   Signed, Armanda Magic, MD 10/02/2013 3:18 PM

## 2013-10-27 ENCOUNTER — Other Ambulatory Visit: Payer: Self-pay

## 2013-10-27 MED ORDER — METOPROLOL TARTRATE 25 MG PO TABS: 12.5000 mg | ORAL_TABLET | Freq: Two times a day (BID) | ORAL | Status: DC

## 2013-10-27 MED ORDER — DIGOXIN 125 MCG PO TABS: 0.1250 mg | ORAL_TABLET | Freq: Every day | ORAL | Status: DC

## 2014-02-15 ENCOUNTER — Encounter: Payer: Self-pay | Admitting: *Deleted

## 2014-05-17 ENCOUNTER — Ambulatory Visit (INDEPENDENT_AMBULATORY_CARE_PROVIDER_SITE_OTHER): Payer: Medicare Other | Admitting: Cardiology

## 2014-05-17 ENCOUNTER — Other Ambulatory Visit: Payer: Self-pay | Admitting: General Surgery

## 2014-05-17 ENCOUNTER — Encounter: Payer: Self-pay | Admitting: Cardiology

## 2014-05-17 VITALS — BP 132/66 | HR 66 | Ht 68.0 in | Wt 171.8 lb

## 2014-05-17 DIAGNOSIS — I482 Chronic atrial fibrillation, unspecified: Secondary | ICD-10-CM

## 2014-05-17 DIAGNOSIS — G4733 Obstructive sleep apnea (adult) (pediatric): Secondary | ICD-10-CM

## 2014-05-17 DIAGNOSIS — I4891 Unspecified atrial fibrillation: Secondary | ICD-10-CM

## 2014-05-17 MED ORDER — CLOPIDOGREL BISULFATE 75 MG PO TABS: 75.0000 mg | ORAL_TABLET | Freq: Every day | ORAL | Status: DC

## 2014-05-17 MED ORDER — DIGOXIN 125 MCG PO TABS: 0.1250 mg | ORAL_TABLET | Freq: Every day | ORAL | Status: DC

## 2014-05-17 MED ORDER — METOPROLOL TARTRATE 25 MG PO TABS: 12.5000 mg | ORAL_TABLET | Freq: Two times a day (BID) | ORAL | Status: DC

## 2014-05-17 NOTE — Patient Instructions (Signed)
Your physician recommends that you continue on your current medications as directed. Please refer to the Current Medication list given to you today.  Your physician recommends that you go to the lab today for a Digoxin level  Your physician wants you to follow-up in: 6 months with Dr Sherlyn Lick will receive a reminder letter in the mail two months in advance. If you don't receive a letter, please call our office to schedule the follow-up appointment.

## 2014-05-17 NOTE — Progress Notes (Signed)
  [address] [place], [place]  [address] Phone: [phone] Fax:  [phone]  Date:  05/17/2014   ID:  Erik Arnold, DOB [birth year], MRN [id number]  PCP:  Erik Reichert, MD  Cardiologist:  Erik Magic, MD     History of Present Illness: Erik Arnold is a 80 y.o. male with a history of chronic atrial fibrillation not on chronic anticoagulation due to high fall risk who presents today for followup. He is doing well today. He denies any chest pain, SOB, DOE, LE edema, dizziness, palpitations or syncope.     Wt Readings from Last 3 Encounters:  05/17/14 171 lb 12.8 oz (77.928 kg)  10/02/13 169 lb (76.658 kg)  08/23/12 167 lb 15.9 oz (76.2 kg)     Past Medical History  Diagnosis Date  . Stroke 08/22/2012    residual left sided weakness (08/23/2012)  . TIA (transient ischemic attack)   . Carpal tunnel syndrome     diagnosed in 1997  . Chronic atrial fibrillation     Not on coumadin as he was deemed a fall risk after he fell in his cardiologists office.    Current Outpatient Prescriptions  Medication Sig Dispense Refill  . aspirin 81 MG EC tablet Take 1 tablet (81 mg total) by mouth daily. Swallow whole.  30 tablet  12  . clopidogrel (PLAVIX) 75 MG tablet Take 1 tablet (75 mg total) by mouth daily.  30 tablet  11  . digoxin (LANOXIN) 0.125 MG tablet Take 1 tablet (0.125 mg total) by mouth daily.  30 tablet  6  . metoprolol tartrate (LOPRESSOR) 25 MG tablet Take 0.5 tablets (12.5 mg total) by mouth 2 (two) times daily.  30 tablet  6   No current facility-administered medications for this visit.    Allergies:   No Known Allergies  Social History:  The patient  reports that he has quit smoking. His smoking use included Pipe. He has quit using smokeless tobacco. His smokeless tobacco use included Chew. He reports that he does not drink alcohol or use illicit drugs.   Family History:  The patient's family history includes CVA in his mother; Pneumonia in his father.    ROS:  Please see the history of present illness.      All other systems reviewed and negative.   PHYSICAL EXAM: VS:  BP 132/66  Pulse 66  Ht  (1.727 m)  Wt 171 lb 12.8 oz (77.928 kg)  BMI 26.13 kg/m2  SpO2 99% Well nourished, well developed, in no acute distress HEENT: normal Neck: no JVD Cardiac:  normal S1, S2; RRR; no murmur Lungs:  clear to auscultation bilaterally, no wheezing, rhonchi or rales Abd: soft, nontender, no hepatomegaly Ext: no edema Skin: warm and dry Neuro:  CNs 2-12 intact, no focal abnormalities noted  ASSESSMENT AND PLAN:  1. Chronic atrial fibrillation rate controlled- not a long term anticoagulation candidate due to fall risk.  - continue Plavix/dig/metoprolol/ASA  - check dig level   Followup with me in 6 months    Signed, Erik Magic, MD 05/17/2014 3:03 PM

## 2014-05-18 ENCOUNTER — Encounter: Payer: Self-pay | Admitting: General Surgery

## 2014-05-18 LAB — DIGOXIN LEVEL: DIGOXIN LVL: 0.6 ng/mL — AB (ref 0.8–2.0)

## 2014-05-21 ENCOUNTER — Inpatient Hospital Stay (HOSPITAL_COMMUNITY): Payer: Medicare Other | Admitting: Anesthesiology

## 2014-05-21 ENCOUNTER — Encounter (HOSPITAL_COMMUNITY): Payer: Medicare Other | Admitting: Anesthesiology

## 2014-05-21 ENCOUNTER — Encounter (HOSPITAL_COMMUNITY)
Admission: EM | Disposition: A | Discharge status: Skilled Nursing Facility | Payer: Self-pay | Source: Home / Self Care | Attending: Internal Medicine

## 2014-05-21 ENCOUNTER — Inpatient Hospital Stay (HOSPITAL_COMMUNITY)
Admission: EM | Admit: 2014-05-21 | Discharge: 2014-05-24 | DRG: 481 | Disposition: A | Discharge status: Skilled Nursing Facility | Payer: Medicare Other | Attending: Internal Medicine | Admitting: Internal Medicine

## 2014-05-21 ENCOUNTER — Emergency Department (HOSPITAL_COMMUNITY): Payer: Medicare Other

## 2014-05-21 ENCOUNTER — Encounter (HOSPITAL_COMMUNITY): Payer: Self-pay | Admitting: Emergency Medicine

## 2014-05-21 DIAGNOSIS — I69998 Other sequelae following unspecified cerebrovascular disease: Secondary | ICD-10-CM

## 2014-05-21 DIAGNOSIS — I10 Essential (primary) hypertension: Secondary | ICD-10-CM | POA: Diagnosis present

## 2014-05-21 DIAGNOSIS — K5900 Constipation, unspecified: Secondary | ICD-10-CM | POA: Diagnosis not present

## 2014-05-21 DIAGNOSIS — R29898 Other symptoms and signs involving the musculoskeletal system: Secondary | ICD-10-CM | POA: Diagnosis present

## 2014-05-21 DIAGNOSIS — Z79899 Other long term (current) drug therapy: Secondary | ICD-10-CM

## 2014-05-21 DIAGNOSIS — I4891 Unspecified atrial fibrillation: Secondary | ICD-10-CM

## 2014-05-21 DIAGNOSIS — S72143A Displaced intertrochanteric fracture of unspecified femur, initial encounter for closed fracture: Principal | ICD-10-CM | POA: Diagnosis present

## 2014-05-21 DIAGNOSIS — S72142A Displaced intertrochanteric fracture of left femur, initial encounter for closed fracture: Secondary | ICD-10-CM

## 2014-05-21 DIAGNOSIS — Z66 Do not resuscitate: Secondary | ICD-10-CM | POA: Diagnosis present

## 2014-05-21 DIAGNOSIS — R4181 Age-related cognitive decline: Secondary | ICD-10-CM | POA: Diagnosis present

## 2014-05-21 DIAGNOSIS — Y92009 Unspecified place in unspecified non-institutional (private) residence as the place of occurrence of the external cause: Secondary | ICD-10-CM

## 2014-05-21 DIAGNOSIS — G5600 Carpal tunnel syndrome, unspecified upper limb: Secondary | ICD-10-CM | POA: Diagnosis present

## 2014-05-21 DIAGNOSIS — Z7982 Long term (current) use of aspirin: Secondary | ICD-10-CM | POA: Diagnosis not present

## 2014-05-21 DIAGNOSIS — S72002A Fracture of unspecified part of neck of left femur, initial encounter for closed fracture: Secondary | ICD-10-CM | POA: Diagnosis present

## 2014-05-21 DIAGNOSIS — Z823 Family history of stroke: Secondary | ICD-10-CM

## 2014-05-21 DIAGNOSIS — W19XXXA Unspecified fall, initial encounter: Secondary | ICD-10-CM | POA: Diagnosis present

## 2014-05-21 DIAGNOSIS — Z87891 Personal history of nicotine dependence: Secondary | ICD-10-CM

## 2014-05-21 DIAGNOSIS — Z7902 Long term (current) use of antithrombotics/antiplatelets: Secondary | ICD-10-CM

## 2014-05-21 DIAGNOSIS — S72002D Fracture of unspecified part of neck of left femur, subsequent encounter for closed fracture with routine healing: Secondary | ICD-10-CM

## 2014-05-21 DIAGNOSIS — I482 Chronic atrial fibrillation, unspecified: Secondary | ICD-10-CM | POA: Diagnosis present

## 2014-05-21 DIAGNOSIS — N390 Urinary tract infection, site not specified: Secondary | ICD-10-CM

## 2014-05-21 DIAGNOSIS — S79919A Unspecified injury of unspecified hip, initial encounter: Secondary | ICD-10-CM | POA: Diagnosis present

## 2014-05-21 DIAGNOSIS — G459 Transient cerebral ischemic attack, unspecified: Secondary | ICD-10-CM | POA: Diagnosis present

## 2014-05-21 DIAGNOSIS — S72009A Fracture of unspecified part of neck of unspecified femur, initial encounter for closed fracture: Secondary | ICD-10-CM

## 2014-05-21 HISTORY — PX: INTRAMEDULLARY (IM) NAIL INTERTROCHANTERIC: SHX5875

## 2014-05-21 LAB — SURGICAL PCR SCREEN
MRSA, PCR: NEGATIVE
Staphylococcus aureus: NEGATIVE

## 2014-05-21 LAB — DIGOXIN LEVEL: DIGOXIN LVL: 0.4 ng/mL — AB (ref 0.8–2.0)

## 2014-05-21 LAB — URINE MICROSCOPIC-ADD ON

## 2014-05-21 LAB — URINALYSIS, ROUTINE W REFLEX MICROSCOPIC
Bilirubin Urine: NEGATIVE
Glucose, UA: NEGATIVE mg/dL
Ketones, ur: 15 mg/dL — AB
NITRITE: NEGATIVE
Protein, ur: NEGATIVE mg/dL
SPECIFIC GRAVITY, URINE: 1.02 (ref 1.005–1.030)
UROBILINOGEN UA: 0.2 mg/dL (ref 0.0–1.0)
pH: 6 (ref 5.0–8.0)

## 2014-05-21 LAB — CBC WITH DIFFERENTIAL/PLATELET
Basophils Absolute: 0 10*3/uL (ref 0.0–0.1)
Basophils Relative: 0 % (ref 0–1)
Eosinophils Absolute: 0 10*3/uL (ref 0.0–0.7)
Eosinophils Relative: 0 % (ref 0–5)
HCT: 37.3 % — ABNORMAL LOW (ref 39.0–52.0)
Hemoglobin: 12.9 g/dL — ABNORMAL LOW (ref 13.0–17.0)
Lymphocytes Relative: 6 % — ABNORMAL LOW (ref 12–46)
Lymphs Abs: 0.7 10*3/uL (ref 0.7–4.0)
MCH: 36.9 pg — ABNORMAL HIGH (ref 26.0–34.0)
MCHC: 34.6 g/dL (ref 30.0–36.0)
MCV: 106.6 fL — ABNORMAL HIGH (ref 78.0–100.0)
Monocytes Absolute: 1.2 10*3/uL — ABNORMAL HIGH (ref 0.1–1.0)
Monocytes Relative: 10 % (ref 3–12)
Neutro Abs: 9.2 10*3/uL — ABNORMAL HIGH (ref 1.7–7.7)
Neutrophils Relative %: 84 % — ABNORMAL HIGH (ref 43–77)
Platelets: 160 10*3/uL (ref 150–400)
RBC: 3.5 MIL/uL — ABNORMAL LOW (ref 4.22–5.81)
RDW: 13.8 % (ref 11.5–15.5)
WBC: 11 10*3/uL — ABNORMAL HIGH (ref 4.0–10.5)

## 2014-05-21 LAB — CK TOTAL AND CKMB (NOT AT ARMC)
CK, MB: 4.3 ng/mL — ABNORMAL HIGH (ref 0.3–4.0)
RELATIVE INDEX: 1.7 (ref 0.0–2.5)
Total CK: 246 U/L — ABNORMAL HIGH (ref 7–232)

## 2014-05-21 LAB — COMPREHENSIVE METABOLIC PANEL
ALK PHOS: 57 U/L (ref 39–117)
ALT: 13 U/L (ref 0–53)
AST: 18 U/L (ref 0–37)
Albumin: 3.6 g/dL (ref 3.5–5.2)
Anion gap: 14 (ref 5–15)
BUN: 22 mg/dL (ref 6–23)
CALCIUM: 8.7 mg/dL (ref 8.4–10.5)
CO2: 24 meq/L (ref 19–32)
Chloride: 101 mEq/L (ref 96–112)
Creatinine, Ser: 0.72 mg/dL (ref 0.50–1.35)
GFR calc Af Amer: >90 mL/min (ref >90)
GFR calc non Af Amer: 81 mL/min — ABNORMAL LOW (ref >90)
Glucose, Bld: 119 mg/dL — ABNORMAL HIGH (ref 70–99)
POTASSIUM: 4.2 meq/L (ref 3.7–5.3)
SODIUM: 139 meq/L (ref 137–147)
TOTAL PROTEIN: 6.6 g/dL (ref 6.0–8.3)
Total Bilirubin: 0.7 mg/dL (ref 0.3–1.2)

## 2014-05-21 LAB — ABO/RH: ABO/RH(D): A NEG

## 2014-05-21 LAB — TYPE AND SCREEN
ABO/RH(D): A NEG
ANTIBODY SCREEN: NEGATIVE

## 2014-05-21 SURGERY — FIXATION, FRACTURE, INTERTROCHANTERIC, WITH INTRAMEDULLARY ROD
Anesthesia: General | Site: Hip | Laterality: Left

## 2014-05-21 MED ORDER — ROCURONIUM BROMIDE 50 MG/5ML IV SOLN
INTRAVENOUS | Status: AC
  Filled 2014-05-21: qty 1

## 2014-05-21 MED ORDER — PROPOFOL 10 MG/ML IV BOLUS
INTRAVENOUS | Status: DC | PRN
  Administered 2014-05-21: 120 mg via INTRAVENOUS

## 2014-05-21 MED ORDER — PROPOFOL 10 MG/ML IV BOLUS
INTRAVENOUS | Status: AC
  Filled 2014-05-21: qty 20

## 2014-05-21 MED ORDER — FENTANYL CITRATE 0.05 MG/ML IJ SOLN
INTRAMUSCULAR | Status: DC | PRN
  Administered 2014-05-21: 50 ug via INTRAVENOUS
  Administered 2014-05-21 (×2): 25 ug via INTRAVENOUS
  Administered 2014-05-21: 50 ug via INTRAVENOUS

## 2014-05-21 MED ORDER — METOPROLOL TARTRATE 25 MG PO TABS
25.0000 mg | ORAL_TABLET | Freq: Four times a day (QID) | ORAL | Status: DC
  Administered 2014-05-21: 25 mg via ORAL
  Filled 2014-05-21 (×4): qty 1

## 2014-05-21 MED ORDER — NEOSTIGMINE METHYLSULFATE 10 MG/10ML IV SOLN
INTRAVENOUS | Status: DC | PRN
  Administered 2014-05-21: 4 mg via INTRAVENOUS

## 2014-05-21 MED ORDER — ONDANSETRON HCL 4 MG/2ML IJ SOLN
INTRAMUSCULAR | Status: DC | PRN
  Administered 2014-05-21: 4 mg via INTRAVENOUS

## 2014-05-21 MED ORDER — METOPROLOL TARTRATE 12.5 MG HALF TABLET
12.5000 mg | ORAL_TABLET | Freq: Four times a day (QID) | ORAL | Status: DC
  Administered 2014-05-22: 12.5 mg via ORAL
  Filled 2014-05-21 (×6): qty 1

## 2014-05-21 MED ORDER — SODIUM CHLORIDE 0.9 % IV SOLN
INTRAVENOUS | Status: DC
  Administered 2014-05-21: 20:00:00 via INTRAVENOUS

## 2014-05-21 MED ORDER — FENTANYL CITRATE 0.05 MG/ML IJ SOLN
INTRAMUSCULAR | Status: AC
  Filled 2014-05-21: qty 5

## 2014-05-21 MED ORDER — NEOSTIGMINE METHYLSULFATE 10 MG/10ML IV SOLN
INTRAVENOUS | Status: AC
  Filled 2014-05-21: qty 1

## 2014-05-21 MED ORDER — GLYCOPYRROLATE 0.2 MG/ML IJ SOLN
INTRAMUSCULAR | Status: DC | PRN
  Administered 2014-05-21: .6 mg via INTRAVENOUS

## 2014-05-21 MED ORDER — MORPHINE SULFATE 2 MG/ML IJ SOLN: 2.0000 mg | INTRAMUSCULAR | Status: DC | PRN

## 2014-05-21 MED ORDER — EPHEDRINE SULFATE 50 MG/ML IJ SOLN
INTRAMUSCULAR | Status: AC
  Filled 2014-05-21: qty 1

## 2014-05-21 MED ORDER — ONDANSETRON HCL 4 MG/2ML IJ SOLN: 4.0000 mg | Freq: Four times a day (QID) | INTRAMUSCULAR | Status: DC | PRN

## 2014-05-21 MED ORDER — PHENYLEPHRINE 40 MCG/ML (10ML) SYRINGE FOR IV PUSH (FOR BLOOD PRESSURE SUPPORT)
PREFILLED_SYRINGE | INTRAVENOUS | Status: AC
  Filled 2014-05-21: qty 10

## 2014-05-21 MED ORDER — GLYCOPYRROLATE 0.2 MG/ML IJ SOLN
INTRAMUSCULAR | Status: AC
  Filled 2014-05-21: qty 3

## 2014-05-21 MED ORDER — ROCURONIUM BROMIDE 100 MG/10ML IV SOLN
INTRAVENOUS | Status: DC | PRN
  Administered 2014-05-21: 25 mg via INTRAVENOUS
  Administered 2014-05-21: 10 mg via INTRAVENOUS

## 2014-05-21 MED ORDER — POLYETHYLENE GLYCOL 3350 17 G PO PACK: 17.0000 g | PACK | Freq: Every day | ORAL | Status: DC | PRN

## 2014-05-21 MED ORDER — DEXAMETHASONE SODIUM PHOSPHATE 4 MG/ML IJ SOLN
INTRAMUSCULAR | Status: AC
  Filled 2014-05-21: qty 1

## 2014-05-21 MED ORDER — EPHEDRINE SULFATE 50 MG/ML IJ SOLN
INTRAMUSCULAR | Status: DC | PRN
  Administered 2014-05-21: 10 mg via INTRAVENOUS

## 2014-05-21 MED ORDER — ONDANSETRON HCL 4 MG PO TABS: 4.0000 mg | ORAL_TABLET | Freq: Four times a day (QID) | ORAL | Status: DC | PRN

## 2014-05-21 MED ORDER — LIDOCAINE HCL (CARDIAC) 20 MG/ML IV SOLN
INTRAVENOUS | Status: DC | PRN
  Administered 2014-05-21: 30 mg via INTRAVENOUS

## 2014-05-21 MED ORDER — LACTATED RINGERS IV SOLN: INTRAVENOUS | Status: DC | PRN

## 2014-05-21 MED ORDER — BISACODYL 5 MG PO TBEC
5.0000 mg | DELAYED_RELEASE_TABLET | Freq: Every day | ORAL | Status: DC | PRN
Start: 2014-05-21 — End: 2014-05-24

## 2014-05-21 MED ORDER — 0.9 % SODIUM CHLORIDE (POUR BTL) OPTIME
TOPICAL | Status: DC | PRN
  Administered 2014-05-21: 1000 mL

## 2014-05-21 MED ORDER — ARTIFICIAL TEARS OP OINT
TOPICAL_OINTMENT | OPHTHALMIC | Status: DC | PRN
  Administered 2014-05-21: 1 via OPHTHALMIC

## 2014-05-21 MED ORDER — ONDANSETRON HCL 4 MG/2ML IJ SOLN
INTRAMUSCULAR | Status: AC
  Filled 2014-05-21: qty 2

## 2014-05-21 MED ORDER — SODIUM CHLORIDE 0.9 % IJ SOLN
3.0000 mL | Freq: Two times a day (BID) | INTRAMUSCULAR | Status: DC
  Administered 2014-05-22: 3 mL via INTRAVENOUS

## 2014-05-21 MED ORDER — DIGOXIN 125 MCG PO TABS
0.1250 mg | ORAL_TABLET | Freq: Every day | ORAL | Status: DC
  Administered 2014-05-22 – 2014-05-24 (×3): 0.125 mg via ORAL
  Filled 2014-05-21 (×3): qty 1

## 2014-05-21 MED ORDER — HEPARIN SODIUM (PORCINE) 5000 UNIT/ML IJ SOLN
5000.0000 [IU] | Freq: Three times a day (TID) | INTRAMUSCULAR | Status: DC
  Administered 2014-05-22 – 2014-05-24 (×7): 5000 [IU] via SUBCUTANEOUS
  Filled 2014-05-21 (×10): qty 1

## 2014-05-21 MED ORDER — DIGOXIN 0.25 MG/ML IJ SOLN
0.2500 mg | Freq: Once | INTRAMUSCULAR | Status: AC
  Administered 2014-05-21: 0.25 mg via INTRAVENOUS
  Filled 2014-05-21: qty 2

## 2014-05-21 MED ORDER — PHENYLEPHRINE HCL 10 MG/ML IJ SOLN
10.0000 mg | INTRAVENOUS | Status: DC | PRN
  Administered 2014-05-21: 50 ug/min via INTRAVENOUS
  Administered 2014-05-21: 25 ug/min via INTRAVENOUS

## 2014-05-21 MED ORDER — LIDOCAINE HCL (CARDIAC) 20 MG/ML IV SOLN
INTRAVENOUS | Status: AC
  Filled 2014-05-21: qty 5

## 2014-05-21 MED ORDER — SUCCINYLCHOLINE CHLORIDE 20 MG/ML IJ SOLN
INTRAMUSCULAR | Status: AC
  Filled 2014-05-21: qty 1

## 2014-05-21 MED ORDER — METOPROLOL TARTRATE 25 MG PO TABS: 25.0000 mg | ORAL_TABLET | Freq: Two times a day (BID) | ORAL | Status: DC

## 2014-05-21 MED ORDER — CEFAZOLIN SODIUM-DEXTROSE 2-3 GM-% IV SOLR
INTRAVENOUS | Status: DC | PRN
  Administered 2014-05-21: 2 g via INTRAVENOUS

## 2014-05-21 MED ORDER — HYDROCODONE-ACETAMINOPHEN 5-325 MG PO TABS
1.0000 | ORAL_TABLET | ORAL | Status: DC | PRN
  Administered 2014-05-22 – 2014-05-24 (×4): 2 via ORAL
  Filled 2014-05-21 (×4): qty 2

## 2014-05-21 MED ORDER — ALBUTEROL SULFATE (2.5 MG/3ML) 0.083% IN NEBU: 2.5000 mg | INHALATION_SOLUTION | RESPIRATORY_TRACT | Status: DC | PRN

## 2014-05-21 MED ORDER — SUCCINYLCHOLINE CHLORIDE 20 MG/ML IJ SOLN
INTRAMUSCULAR | Status: DC | PRN
  Administered 2014-05-21: 100 mg via INTRAVENOUS

## 2014-05-21 MED ORDER — DEXTROSE 5 % IV SOLN
1.0000 g | INTRAVENOUS | Status: DC
  Administered 2014-05-21 – 2014-05-23 (×3): 1 g via INTRAVENOUS
  Filled 2014-05-21 (×4): qty 10

## 2014-05-21 MED ORDER — SODIUM CHLORIDE 0.9 % IV SOLN
INTRAVENOUS | Status: AC
  Administered 2014-05-21: 18:00:00 via INTRAVENOUS

## 2014-05-21 MED ORDER — LACTATED RINGERS IV SOLN
INTRAVENOUS | Status: DC | PRN
  Administered 2014-05-21: 23:00:00 via INTRAVENOUS

## 2014-05-21 MED ORDER — DEXAMETHASONE SODIUM PHOSPHATE 4 MG/ML IJ SOLN
INTRAMUSCULAR | Status: DC | PRN
  Administered 2014-05-21: 4 mg via INTRAVENOUS

## 2014-05-21 MED ORDER — SODIUM CHLORIDE 0.9 % IJ SOLN
INTRAMUSCULAR | Status: AC
  Filled 2014-05-21: qty 10

## 2014-05-21 SURGICAL SUPPLY — 51 items
BLADE SURG 15 STRL LF DISP TIS (BLADE) ×1 IMPLANT
BLADE SURG 15 STRL SS (BLADE) ×2
BLADE SURG ROTATE 9660 (MISCELLANEOUS) IMPLANT
COVER PERINEAL POST (MISCELLANEOUS) ×3 IMPLANT
COVER SURGICAL LIGHT HANDLE (MISCELLANEOUS) ×3 IMPLANT
DRAPE ORTHO SPLIT 77X108 STRL (DRAPES)
DRAPE PROXIMA HALF (DRAPES) IMPLANT
DRAPE STERI IOBAN 125X83 (DRAPES) ×3 IMPLANT
DRAPE SURG ORHT 6 SPLT 77X108 (DRAPES) IMPLANT
DRSG MEPILEX BORDER 4X4 (GAUZE/BANDAGES/DRESSINGS) ×6 IMPLANT
DRSG MEPILEX BORDER 4X8 (GAUZE/BANDAGES/DRESSINGS) ×3 IMPLANT
DURAPREP 26ML APPLICATOR (WOUND CARE) ×3 IMPLANT
ELECT REM PT RETURN 9FT ADLT (ELECTROSURGICAL) ×3
ELECTRODE REM PT RTRN 9FT ADLT (ELECTROSURGICAL) ×1 IMPLANT
FACESHIELD WRAPAROUND (MASK) ×3 IMPLANT
GAUZE XEROFORM 1X8 LF (GAUZE/BANDAGES/DRESSINGS) ×3 IMPLANT
GAUZE XEROFORM 5X9 LF (GAUZE/BANDAGES/DRESSINGS) IMPLANT
GLOVE BIOGEL PI IND STRL 8 (GLOVE) ×1 IMPLANT
GLOVE BIOGEL PI INDICATOR 8 (GLOVE) ×2
GLOVE BIOGEL PI ORTHO PRO SZ7 (GLOVE) ×2
GLOVE PI ORTHO PRO STRL SZ7 (GLOVE) ×1 IMPLANT
GLOVE SURG ORTHO 8.0 STRL STRW (GLOVE) ×3 IMPLANT
GLOVE SURG SS PI 6.5 STRL IVOR (GLOVE) ×3 IMPLANT
GOWN STRL REUS W/ TWL LRG LVL3 (GOWN DISPOSABLE) ×2 IMPLANT
GOWN STRL REUS W/ TWL XL LVL3 (GOWN DISPOSABLE) ×1 IMPLANT
GOWN STRL REUS W/TWL LRG LVL3 (GOWN DISPOSABLE) ×4
GOWN STRL REUS W/TWL XL LVL3 (GOWN DISPOSABLE) ×2
GUIDE PIN 3.2 LONG (PIN) ×3 IMPLANT
GUIDE PIN 3.2MM (MISCELLANEOUS) ×2
GUIDE PIN ORTH 343X3.2XBRAD (MISCELLANEOUS) ×1 IMPLANT
HIP SCREW SET (Screw) ×3 IMPLANT
KIT BASIN OR (CUSTOM PROCEDURE TRAY) ×3 IMPLANT
KIT ROOM TURNOVER OR (KITS) ×3 IMPLANT
LINER BOOT UNIVERSAL DISP (MISCELLANEOUS) ×3 IMPLANT
MANIFOLD NEPTUNE II (INSTRUMENTS) IMPLANT
NAIL IMHS L 10X38 (Nail) ×3 IMPLANT
NS IRRIG 1000ML POUR BTL (IV SOLUTION) ×3 IMPLANT
PACK GENERAL/GYN (CUSTOM PROCEDURE TRAY) ×3 IMPLANT
PAD ARMBOARD 7.5X6 YLW CONV (MISCELLANEOUS) ×6 IMPLANT
SCREW COMPRESSION (Screw) ×3 IMPLANT
SCREW LAG 100MM (Screw) ×3 IMPLANT
SPONGE LAP 4X18 X RAY DECT (DISPOSABLE) IMPLANT
STAPLER VISISTAT 35W (STAPLE) ×3 IMPLANT
SUT ETHILON 2 0 FS 18 (SUTURE) IMPLANT
SUT VIC AB 0 CT1 27 (SUTURE) ×2
SUT VIC AB 0 CT1 27XBRD ANBCTR (SUTURE) ×1 IMPLANT
SUT VIC AB 2-0 CTB1 (SUTURE) ×6 IMPLANT
TAPE STRIPS DRAPE STRL (GAUZE/BANDAGES/DRESSINGS) IMPLANT
TOWEL OR 17X24 6PK STRL BLUE (TOWEL DISPOSABLE) ×3 IMPLANT
TOWEL OR 17X26 10 PK STRL BLUE (TOWEL DISPOSABLE) ×3 IMPLANT
WATER STERILE IRR 1000ML POUR (IV SOLUTION) IMPLANT

## 2014-05-21 NOTE — Anesthesia Preprocedure Evaluation (Addendum)
Anesthesia Evaluation  Patient identified by MRN, date of birth, ID band Patient awake    Reviewed: Allergy & Precautions, H&P , NPO status , Patient's Chart, lab work & pertinent test results  Airway Mallampati: II TM Distance: >3 FB Neck ROM: Full    Dental  (+) Dental Advisory Given, Missing, Caps, Poor Dentition, Chipped Extremely poor dentition; single intact  Crown on front upper.  Other teeth (multiple) broken at gumline and broken in various degrees.:   Pulmonary former smoker,  breath sounds clear to auscultation        Cardiovascular hypertension, Rhythm:Irregular Rate:Normal     Neuro/Psych    GI/Hepatic   Endo/Other    Renal/GU      Musculoskeletal   Abdominal   Peds  Hematology   Anesthesia Other Findings   Reproductive/Obstetrics                         Anesthesia Physical Anesthesia Plan  ASA: III  Anesthesia Plan: General   Post-op Pain Management:    Induction: Intravenous  Airway Management Planned: Oral ETT  Additional Equipment:   Intra-op Plan:   Post-operative Plan: Extubation in OR  Informed Consent: I have reviewed the patients History and Physical, chart, labs and discussed the procedure including the risks, benefits and alternatives for the proposed anesthesia with the patient or authorized representative who has indicated his/her understanding and acceptance.   Dental advisory given  Plan Discussed with: CRNA and Anesthesiologist  Anesthesia Plan Comments: (L. Intertrochanteric Fracture Chronic atrial fibrillation on plavix and aspirin S/P CVA 07/2012  Kipp Brood )        Anesthesia Quick Evaluation

## 2014-05-21 NOTE — Consult Note (Signed)
Reason for Consult: Left hip pain Referring Physician: Dr. Noni Saupe [name] is an 80 y.o. male.  HPI: Erik Arnold is an 80 year old patient with left hip pain. He lives: At home but does have a sister. Does use a walker but is a relatively sufficient household ambulator. He fell last night and denies any loss of consciousness he pulled himself back into bed his been having pain since that time. Been unable to weight-bear bear weight on the left-hand side the patient does have a history of TIA/stroke in 2013 with some residual left-sided weakness. He is on Plavix but was deemed to be a fall risk and thus he is not on Coumadin for his chronic atrial fibrillation  Past Medical History  Diagnosis Date  . Stroke 08/22/2012    residual left sided weakness (08/23/2012)  . TIA (transient ischemic attack)   . Carpal tunnel syndrome     diagnosed in 1997  . Chronic atrial fibrillation     Not on coumadin as he was deemed a fall risk after he fell in his cardiologists office.    Past Surgical History  Procedure Laterality Date  . Inguinal hernia repair  ~ 1992    "left" (08/23/2012)  . Cataract extraction w/ intraocular lens  implant, bilateral  ~ 2005  . Proctosigmoidoscopy      November 2001 normal screening proctocolonoscopy to the cecum     Family History  Problem Relation Age of Onset  . CVA Mother   . Pneumonia Father     Social History:  reports that he has quit smoking. His smoking use included Pipe. He has quit using smokeless tobacco. His smokeless tobacco use included Chew. He reports that he does not drink alcohol or use illicit drugs.  Allergies: No Known Allergies  Medications: I have reviewed the patient's current medications.  Results for orders placed during the hospital encounter of 05/21/14 (from the past 48 hour(s))  URINALYSIS, ROUTINE W REFLEX MICROSCOPIC     Status: Abnormal   Collection Time    05/21/14 10:21 AM      Result Value Ref Range   Color, Urine YELLOW   YELLOW   APPearance HAZY (*) CLEAR   Specific Gravity, Urine 1.020  1.005 - 1.030   pH 6.0  5.0 - 8.0   Glucose, UA NEGATIVE  NEGATIVE mg/dL   Hgb urine dipstick SMALL (*) NEGATIVE   Bilirubin Urine NEGATIVE  NEGATIVE   Ketones, ur 15 (*) NEGATIVE mg/dL   Protein, ur NEGATIVE  NEGATIVE mg/dL   Urobilinogen, UA 0.2  0.0 - 1.0 mg/dL   Nitrite NEGATIVE  NEGATIVE   Leukocytes, UA MODERATE (*) NEGATIVE  URINE MICROSCOPIC-ADD ON     Status: Abnormal   Collection Time    05/21/14 10:21 AM      Result Value Ref Range   Squamous Epithelial / LPF RARE  RARE   Comment: LESS THAN 10 mL OF URINE SUBMITTED     MICROSCOPIC EXAM PERFORMED ON UNCONCENTRATED URINE   WBC, UA 7-10  <3 WBC/hpf   RBC / HPF 3-6  <3 RBC/hpf   Bacteria, UA FEW (*) RARE  CBC WITH DIFFERENTIAL     Status: Abnormal   Collection Time    05/21/14 10:35 AM      Result Value Ref Range   WBC 11.0 (*) 4.0 - 10.5 K/uL   RBC 3.50 (*) 4.22 - 5.81 MIL/uL   Hemoglobin 12.9 (*) 13.0 - 17.0 g/dL   HCT 37.3 (*) 39.0 -  52.0 %   MCV 106.6 (*) 78.0 - 100.0 fL   MCH 36.9 (*) 26.0 - 34.0 pg   MCHC 34.6  30.0 - 36.0 g/dL   RDW 13.8  11.5 - 15.5 %   Platelets 160  150 - 400 K/uL   Neutrophils Relative % 84 (*) 43 - 77 %   Neutro Abs 9.2 (*) 1.7 - 7.7 K/uL   Lymphocytes Relative 6 (*) 12 - 46 %   Lymphs Abs 0.7  0.7 - 4.0 K/uL   Monocytes Relative 10  3 - 12 %   Monocytes Absolute 1.2 (*) 0.1 - 1.0 K/uL   Eosinophils Relative 0  0 - 5 %   Eosinophils Absolute 0.0  0.0 - 0.7 K/uL   Basophils Relative 0  0 - 1 %   Basophils Absolute 0.0  0.0 - 0.1 K/uL  COMPREHENSIVE METABOLIC PANEL     Status: Abnormal   Collection Time    05/21/14 10:35 AM      Result Value Ref Range   Sodium 139  137 - 147 mEq/L   Potassium 4.2  3.7 - 5.3 mEq/L   Chloride 101  96 - 112 mEq/L   CO2 24  19 - 32 mEq/L   Glucose, Bld 119 (*) 70 - 99 mg/dL   BUN 22  6 - 23 mg/dL   Creatinine, Ser 0.72  0.50 - 1.35 mg/dL   Calcium 8.7  8.4 - 10.5 mg/dL   Total  Protein 6.6  6.0 - 8.3 g/dL   Albumin 3.6  3.5 - 5.2 g/dL   AST 18  0 - 37 U/L   ALT 13  0 - 53 U/L   Alkaline Phosphatase 57  39 - 117 U/L   Total Bilirubin 0.7  0.3 - 1.2 mg/dL   GFR calc non Af Amer 81 (*) >90 mL/min   GFR calc Af Amer >90  >90 mL/min   Comment: (NOTE)     The eGFR has been calculated using the CKD EPI equation.     This calculation has not been validated in all clinical situations.     eGFR's persistently <90 mL/min signify possible Chronic Kidney     Disease.   Anion gap 14  5 - 15  TYPE AND SCREEN     Status: None   Collection Time    05/21/14 10:35 AM      Result Value Ref Range   ABO/RH(D) A NEG     Antibody Screen NEG     Sample Expiration 05/24/2014    DIGOXIN LEVEL     Status: Abnormal   Collection Time    05/21/14 10:35 AM      Result Value Ref Range   Digoxin Level 0.4 (*) 0.8 - 2.0 ng/mL  ABO/RH     Status: None   Collection Time    05/21/14 10:35 AM      Result Value Ref Range   ABO/RH(D) A NEG      Dg Chest 1 View  05/21/2014   CLINICAL DATA:  Left hip fracture following a fall.  EXAM: CHEST - 1 VIEW  COMPARISON:  None.  FINDINGS: Borderline enlarged cardiac silhouette. Tortuous aorta. Mildly prominent interstitial markings with minimal somewhat patchy density at the left lateral lung base. Old, healed left inferior rib fractures laterally. No acute fracture or pneumothorax. Thoracic spine and cervical spine degenerative changes.  IMPRESSION: Borderline cardiomegaly, mild chronic interstitial lung disease and small amount atelectasis or scarring at the left  lung base.   Electronically Signed   By: Enrique Sack M.D.   On: 05/21/2014 12:01   Dg Hip Complete Left  05/21/2014   CLINICAL DATA:  Fall with left hip fracture.  EXAM: LEFT HIP - COMPLETE 2+ VIEW  COMPARISON:  None.  FINDINGS: There is an intertrochanteric fracture of the left femur with varus angulation of the fracture fragments. No additional evidence of an acute fracture. Bones appear  demineralized. Degenerative changes are seen in the lumbosacral spine.  IMPRESSION: 1. Left intertrochanteric femur fracture. 2. Osteopenia. 3. Lumbar spondylosis.   Electronically Signed   By: Lorin Picket M.D.   On: 05/21/2014 11:58   Dg Femur Left  05/21/2014   CLINICAL DATA:  Fall with left hip fracture.  EXAM: LEFT FEMUR - 2 VIEW  COMPARISON:  None.  FINDINGS: There is an intertrochanteric fracture of the left femur with varus angulation. No dislocation. Bones appear demineralized. Left obturator ring appears grossly intact. Incidental imaging of the left knee reveals mild patellofemoral osteophytosis.  IMPRESSION: Left intertrochanteric femur fracture.  Osteopenia.   Electronically Signed   By: Lorin Picket M.D.   On: 05/21/2014 11:57    Review of Systems  Constitutional: Negative.   HENT: Negative.   Respiratory: Negative.   Cardiovascular: Negative.   Gastrointestinal: Negative.   Genitourinary: Negative.   Musculoskeletal: Positive for joint pain.  Skin: Negative.   Neurological: Negative.   Endo/Heme/Allergies: Negative.   Psychiatric/Behavioral: Negative.    Blood pressure 102/57, pulse 86, temperature 98.1 F (36.7 C), temperature source Oral, resp. rate 22, SpO2 98.00%. Physical Exam  Constitutional: He appears well-developed.  HENT:  Head: Normocephalic.  Eyes: Pupils are equal, round, and reactive to light.  Neck: Normal range of motion.  Cardiovascular: Normal rate.   Respiratory: Effort normal.  Neurological: He is alert.  Skin: Skin is warm.  Psychiatric: He has a normal mood and affect.   examination of the extremities demonstrates full active and passive range of motion wrists elbows shoulders bilaterally neck range of motion is full radial pulse intact bilaterally grip strength intact bilaterally but slightly weak on the left compared to the right on the left-hand side he has shortening of the left lower extremity but palpable pedal pulses intact ankle dorsi  flexion plantar flexion bilaterally with no knee effusion or ankle bruising or crepitus on either side. 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 Plan for fixation tonight. Risk and benefits discussed with the patient including but limited to infection perioperative event such as stroke heart attack. Also risk of nonunion or malunion. In general that I think the patient should do reasonably well with this surgical intervention to repair his hip fracture do anticipate nursing home stay of one to 2 weeks following surgery. They should understands risk and benefits and wished to proceed. Medical admission pending. Currently laboratory values blood reasonable and acceptable for operative risk.  Tabetha Haraway SCOTT 05/21/2014, 12:56 PM

## 2014-05-21 NOTE — Brief Op Note (Signed)
05/21/2014  11:47 PM  PATIENT:  Erik Arnold  80 y.o. male  PRE-OPERATIVE DIAGNOSIS:  LEFT HIP FRACTURE  POST-OPERATIVE DIAGNOSIS:  LEFT HIP FRACTURE  PROCEDURE:  Procedure(s): LEFT INTRAMEDULLARY (IM) NAIL INTERTROCHANTRIC  SURGEON:  Surgeon(s): Cammy Copa, MD  ASSISTANT: none  ANESTHESIA:   general  EBL: 50 ml    Total I/O In: 600 [I.V.:600] Out: 100 [Urine:100]  BLOOD ADMINISTERED: none  DRAINS: none   LOCAL MEDICATIONS USED:  none  SPECIMEN:  No Specimen  COUNTS:  YES  TOURNIQUET:  * No tourniquets in log *  DICTATION: .Other Dictation: Dictation Number [id number]  PLAN OF CARE: Admit to inpatient   PATIENT DISPOSITION:  PACU - hemodynamically stable

## 2014-05-21 NOTE — ED Notes (Signed)
Patient states he fell this am at approx 0100 trying to go to restroom, patient with left upper leg pain with movement, +PMS, decreased motor due to pain

## 2014-05-21 NOTE — H&P (Addendum)
[facility] History and Physical                                                                                    Patient Demographics  Erik Arnold, is a 80 y.o. male  MRN: [id number]   DOB - [birth year]  Admit Date - 05/21/2014  Outpatient Primary MD for the patient is Quintella Reichert, MD   With History of -  Past Medical History  Diagnosis Date  . Stroke 08/22/2012    residual left sided weakness (08/23/2012)  . TIA (transient ischemic attack)   . Carpal tunnel syndrome     diagnosed in 1997  . Chronic atrial fibrillation     Not on coumadin as he was deemed a fall risk after he fell in his cardiologists office.      Past Surgical History  Procedure Laterality Date  . Inguinal hernia repair  ~ 1992    "left" (08/23/2012)  . Cataract extraction w/ intraocular lens  implant, bilateral  ~ 2005  . Proctosigmoidoscopy      November 2001 normal screening proctocolonoscopy to the cecum     in for   Chief Complaint  Patient presents with  . Leg Injury     HPI  Erik Arnold  is a 80 y.o. male, with history of chronic Afib, and TIA, who presents with a fall , and found to have Left femur fracture in ED. He reports falling overnight when he got up to use the restroom. He has left sided weakness and states that he drags his left leg some at baseline, he denies any dizziness, lightheadedness, no loss of consciousness, no shortness of breath, no chest pain.  After falling he reports being unable to ambulate, at which time he drug himself back to his bed, where he waited 6-7 hours before calling his sister who then called EMS. Patient is known to have history of A. Fib, not on any anticoagulation secondary to falls, the patient initially was in A. fib with RVR, but articulate but controlled after he received IV digoxin, as his level was low, and by mouth metoprolol, as he missed his evening and morning dose. Denies any chest pain, dizziness, shortness of breath or  palpitation.   Review of Systems    In addition to the HPI above,  No Fever-chills,  No Headache, No changes with Vision or hearing,  No problems swallowing food or Liquids,  No Chest pain, Cough or Shortness of Breath,  No Abdominal pain, complaints of Nausea or Vommitting, Bowel movements are regular,  No Blood in stool or Urine,  No dysuria,  No new skin rashes or bruises,  Pains off pain and ache in left hip area secondary to fall. No new weakness, tingling, numbness in any extremity,  No recent weight gain or loss,  No polyuria, polydypsia or polyphagia,  No significant Mental Stressors.  A full 10 point Review of Systems was done, except as stated above, all other Review of Systems were negative.   Social History History  Substance Use Topics  . Smoking status: Former Smoker -- 5 years    Types: Pipe  . Smokeless tobacco: Former Neurosurgeon  Types: Chew     Comment: 08/23/2012 "smoked a little pipe in my 20's; haven't chewed in ~ 81month"  . Alcohol Use: No     Comment: 08/23/2012 "last alcohol was a beer in 1980; never had problems w/it"     Family History Family History  Problem Relation Age of Onset  . CVA Mother   . Pneumonia Father      Prior to Admission medications   Medication Sig Start Date End Date Taking? Authorizing Provider  aspirin 81 MG EC tablet Take 1 tablet (81 mg total) by mouth daily. Swallow whole. 08/25/12  Yes Richarda Overlie, MD  clopidogrel (PLAVIX) 75 MG tablet Take 1 tablet (75 mg total) by mouth daily. 05/17/14  Yes Quintella Reichert, MD  digoxin (LANOXIN) 0.125 MG tablet Take 1 tablet (0.125 mg total) by mouth daily. 05/17/14  Yes Quintella Reichert, MD  metoprolol tartrate (LOPRESSOR) 25 MG tablet Take 0.5 tablets (12.5 mg total) by mouth 2 (two) times daily. 05/17/14  Yes Quintella Reichert, MD    No Known Allergies  Physical Exam  Vitals  Blood pressure 127/64, pulse 99, temperature 98.1 F (36.7 C), temperature source Oral, resp. rate 22,  SpO2 98.00%.   General:  Well developed male, lying in bed in NAD, appears stated age  Psych:  Normal affect and insight, Awake Alert, Oriented X 3.  Neuro:   No F.N deficits, Sensation intact all 4 extremities.  Respiratory:  Symmetrical Chest wall movement, Good air movement bilaterally, CTAB with no rales, wheezes or rhonchi.  Cardiac: irregular rate and rhythm, tachycardic upon sitting, No Gallops, Rubs or Murmurs, No Parasternal Heave.  Abdomen:  Positive Bowel Sounds, Abdomen Soft, Non tender, No organomegaly appriciated  Skin: Seborrheic keratosis lesions noted diffusely   Extremities: Pulses present and equal in lower extremities bilaterally. No cyanosis noted. Feet warm and pink bilaterally. Left lower extremity shorter and externally rotated, mild tenderness upon palpation on the left hip area. Data Review  CBC  Recent Labs Lab 05/21/14 1035  WBC 11.0*  HGB 12.9*  HCT 37.3*  PLT 160  MCV 106.6*  MCH 36.9*  MCHC 34.6  RDW 13.8  LYMPHSABS 0.7  MONOABS 1.2*  EOSABS 0.0  BASOSABS 0.0   ------------------------------------------------------------------------------------------------------------------  Chemistries   Recent Labs Lab 05/21/14 1035  NA 139  K 4.2  CL 101  CO2 24  GLUCOSE 119*  BUN 22  CREATININE 0.72  CALCIUM 8.7  AST 18  ALT 13  ALKPHOS 57  BILITOT 0.7   ------------------------------------------------------------------------------------------------------------------ CrCl is unknown because both a height and weight (above a minimum accepted value) are required for this calculation. ------------------------------------------------------------------------------------------------------------------   Urinalysis    Component Value Date/Time   COLORURINE YELLOW 05/21/2014 1021   APPEARANCEUR HAZY* 05/21/2014 1021   LABSPEC 1.020 05/21/2014 1021   PHURINE 6.0 05/21/2014 1021   GLUCOSEU NEGATIVE 05/21/2014 1021   HGBUR SMALL* 05/21/2014 1021    BILIRUBINUR NEGATIVE 05/21/2014 1021   KETONESUR 15* 05/21/2014 1021   PROTEINUR NEGATIVE 05/21/2014 1021   UROBILINOGEN 0.2 05/21/2014 1021   NITRITE NEGATIVE 05/21/2014 1021   LEUKOCYTESUR MODERATE* 05/21/2014 1021    ----------------------------------------------------------------------------------------------------------------  Imaging results:   Dg Chest 1 View  05/21/2014   CLINICAL DATA:  Left hip fracture following a fall.  EXAM: CHEST - 1 VIEW  COMPARISON:  None.  FINDINGS: Borderline enlarged cardiac silhouette. Tortuous aorta. Mildly prominent interstitial markings with minimal somewhat patchy density at the left lateral lung base. Old, healed left inferior  rib fractures laterally. No acute fracture or pneumothorax. Thoracic spine and cervical spine degenerative changes.  IMPRESSION: Borderline cardiomegaly, mild chronic interstitial lung disease and small amount atelectasis or scarring at the left lung base.   Electronically Signed   By: Gordan Payment M.D.   On: 05/21/2014 12:01   Dg Hip Complete Left  05/21/2014   CLINICAL DATA:  Fall with left hip fracture.  EXAM: LEFT HIP - COMPLETE 2+ VIEW  COMPARISON:  None.  FINDINGS: There is an intertrochanteric fracture of the left femur with varus angulation of the fracture fragments. No additional evidence of an acute fracture. Bones appear demineralized. Degenerative changes are seen in the lumbosacral spine.  IMPRESSION: 1. Left intertrochanteric femur fracture. 2. Osteopenia. 3. Lumbar spondylosis.   Electronically Signed   By: Leanna Battles M.D.   On: 05/21/2014 11:58   Dg Femur Left  05/21/2014   CLINICAL DATA:  Fall with left hip fracture.  EXAM: LEFT FEMUR - 2 VIEW  COMPARISON:  None.  FINDINGS: There is an intertrochanteric fracture of the left femur with varus angulation. No dislocation. Bones appear demineralized. Left obturator ring appears grossly intact. Incidental imaging of the left knee reveals mild patellofemoral osteophytosis.   IMPRESSION: Left intertrochanteric femur fracture.  Osteopenia.   Electronically Signed   By: Leanna Battles M.D.   On: 05/21/2014 11:57    My personal review of EKG: Irregularly, irregular rate and rhythm, AFib noted.    Assessment & Plan  Hip fracture -secondary to fall, x-ray revealed Left intertrochanteric femur fracture -ortho surgery consulted, probably surgery today 9/21  - patient is moderate risk for surgery at this point once his heart rate is controlled, denies any chest pain, shortness of breath, or palpitation. - continue with when necessary pain medicine.  Chronic atrial fibrillation -Digoxin level sub therapeutic at 0.4ng/mL, 0.25mg  digoxin ordered 9/21 at 1350 -Patient A. fib with RVR is controlled after receiving beta blockers and digoxin . -Continue with metoprolol 12.5 mg by mouth every 6 hours . -Continue with telemetry monitor. -Continue with digoxin.  History of TIA (transient ischemic attack) -Denies any new focal deficit . -Resume back on aspirin and Plavix once stable .  UTI -continue with rocephin.   DVT Prophylaxis: [place] Heparin  AM Labs Ordered, also please review Full Orders  Family Communication:   patient alert and oriented, called and updated sister ,( MS Lyda Jester).   Code Status:  DNR, confirmed with the patient   Likely DC to  SNF, will consult PT and social worker .  Condition: guarded  Time spent in minutes : 60    Elvis Coil PA-S2 on 05/21/2014 at 1:34 PM  Between 7am to 7pm - Pager -[phone]  After 7pm go to [email or web address] - password [email or web address]  And look for the night coverage person covering me after hours  [facility] Office  [phone] Patient was seen, examined,treatment plan was discussed with the Physician extender. I have directly reviewed the clinical findings, lab, imaging studies and management of this patient in detail. I have made the necessary changes to the above noted documentation, and agree with  the documentation, as recorded by the Physician extender.  Huey Bienenstock MD [facility].

## 2014-05-21 NOTE — ED Provider Notes (Signed)
CSN: [id number]     Arrival date & time 05/21/14  4098 History   First MD Initiated Contact with Patient 05/21/14 [phone]     Chief Complaint  Patient presents with  . Leg Injury     (Consider location/radiation/quality/duration/timing/severity/associated sxs/prior Treatment) The history is provided by the patient.   Erik Arnold is a 80 y.o. male who got out of bed to go to the bathroom about 1 AM, felt weak and fell. After time, he was able to call back in bed. He laid there until this morning, when he discovered that he could not get up and walk because of left flank pain. His sister came to see him, then called the EMS. No other recent illnesses. He denies fever, chills, cough, shortness of breath,or paresthesias. He has not seen his primary care doctor recently. He does not use any ambulation devices. There are no other known modifying factors   Past Medical History  Diagnosis Date  . Stroke 08/22/2012    residual left sided weakness (08/23/2012)  . TIA (transient ischemic attack)   . Carpal tunnel syndrome     diagnosed in 1997  . Chronic atrial fibrillation     Not on coumadin as he was deemed a fall risk after he fell in his cardiologists office.   Past Surgical History  Procedure Laterality Date  . Inguinal hernia repair  ~ 1992    "left" (08/23/2012)  . Cataract extraction w/ intraocular lens  implant, bilateral  ~ 2005  . Proctosigmoidoscopy      November 2001 normal screening proctocolonoscopy to the cecum   . Intramedullary (im) nail intertrochanteric Left 05/21/2014    Procedure: LEFT INTRAMEDULLARY (IM) NAIL INTERTROCHANTRIC;  Surgeon: Cammy Copa, MD;  Location: [facility] OR;  Service: Orthopedics;  Laterality: Left;   Family History  Problem Relation Age of Onset  . CVA Mother   . Pneumonia Father    History  Substance Use Topics  . Smoking status: Former Smoker -- 5 years    Types: Pipe  . Smokeless tobacco: Former Neurosurgeon    Types: Chew     Comment: 08/23/2012  "smoked a little pipe in my 20's; haven't chewed in ~ 45month"  . Alcohol Use: No     Comment: 08/23/2012 "last alcohol was a beer in 1980; never had problems w/it"    Review of Systems  All other systems reviewed and are negative.     Allergies  Review of patient's allergies indicates no known allergies.  Home Medications   Prior to Admission medications   Medication Sig Start Date End Date Taking? Authorizing Provider  digoxin (LANOXIN) 0.125 MG tablet Take 1 tablet (0.125 mg total) by mouth daily. 05/17/14  Yes Quintella Reichert, MD  metoprolol tartrate (LOPRESSOR) 25 MG tablet Take 0.5 tablets (12.5 mg total) by mouth 2 (two) times daily. 05/17/14  Yes Quintella Reichert, MD  aspirin 81 MG EC tablet Take 1 tablet (81 mg total) by mouth daily. Resume after 2 weeks, Swallow whole. 05/24/14   Ripudeep Jenna Luo, MD  aspirin EC 325 MG EC tablet Take 1 tablet (325 mg total) by mouth daily with breakfast. 05/22/14   Cammy Copa, MD  bisacodyl (DULCOLAX) 5 MG EC tablet Take 1 tablet (5 mg total) by mouth daily as needed for moderate constipation. 05/24/14   Ripudeep Jenna Luo, MD  clopidogrel (PLAVIX) 75 MG tablet Take 1 tablet (75 mg total) by mouth daily. Resume once on ASA  05/24/14  Ripudeep Jenna Luo, MD  docusate sodium 100 MG CAPS Take 100 mg by mouth 2 (two) times daily. 05/24/14   Ripudeep Jenna Luo, MD  HYDROcodone-acetaminophen (NORCO/VICODIN) 5-325 MG per tablet Take 1-2 tablets by mouth every 4 (four) hours as needed for moderate pain. 05/22/14   Cammy Copa, MD  polyethylene glycol [facility]) packet Take 17 g by mouth daily. 05/24/14   Ripudeep K Rai, MD   BP 107/68  Pulse 85  Temp(Src) 97.9 F (36.6 C) (Oral)  Resp 18  SpO2 95% Physical Exam  Nursing note and vitals reviewed. Constitutional: He is oriented to person, place, and time. He appears well-developed.  Elderly, frail  HENT:  Head: Normocephalic and atraumatic.  Right Ear: External ear normal.  Left Ear:  External ear normal.  Eyes: Conjunctivae and EOM are normal. Pupils are equal, round, and reactive to light.  Neck: Normal range of motion and phonation normal. Neck supple.  Cardiovascular: Normal rate, regular rhythm and normal heart sounds.   Pulmonary/Chest: Effort normal and breath sounds normal. He exhibits no bony tenderness.  Abdominal: Soft. There is no tenderness.  Musculoskeletal: Normal range of motion.  Left leg is somewhat shortened and externally rotated. The left hip is nontender to palpation. There is tenderness of the left proximal third of the femur with possible deformity in this region. Neurovascular intact distally in the left foot. Pelvis is stable and nontender to rocking motion.  Neurological: He is alert and oriented to person, place, and time. No cranial nerve deficit or sensory deficit. He exhibits normal muscle tone. Coordination normal.  Skin: Skin is warm, dry and intact.  Psychiatric: He has a normal mood and affect. His behavior is normal. Judgment and thought content normal.    ED Course  Procedures (including critical care time) Medications  digoxin (LANOXIN) tablet 0.125 mg (0.125 mg Oral Given 05/24/14 1004)  heparin injection 5,000 Units (5,000 Units Subcutaneous Not Given 05/24/14 1400)  sodium chloride 0.9 % injection 3 mL (3 mLs Intravenous Not Given 05/24/14 1000)  HYDROcodone-acetaminophen (NORCO/VICODIN) 5-325 MG per tablet 1-2 tablet (2 tablets Oral Given 05/24/14 1440)  morphine 2 MG/ML injection 2 mg (not administered)  bisacodyl (DULCOLAX) EC tablet 5 mg (not administered)  ondansetron (ZOFRAN) tablet 4 mg (not administered)    Or  ondansetron (ZOFRAN) injection 4 mg (not administered)  albuterol (PROVENTIL) (2.5 MG/3ML) 0.083% nebulizer solution 2.5 mg (not administered)  cefTRIAXone (ROCEPHIN) 1 g in dextrose 5 % 50 mL IVPB (1 g Intravenous New Bag/Given 05/23/14 1800)  acetaminophen (TYLENOL) tablet 650 mg (not administered)    Or   acetaminophen (TYLENOL) suppository 650 mg (not administered)  metoCLOPramide (REGLAN) tablet 5-10 mg (not administered)    Or  metoCLOPramide (REGLAN) injection 5-10 mg (not administered)  menthol-cetylpyridinium (CEPACOL) lozenge 3 mg (not administered)    Or  phenol (CHLORASEPTIC) mouth spray 1 spray (not administered)  aspirin EC tablet 325 mg (325 mg Oral Given 05/24/14 0757)  ondansetron (ZOFRAN) injection 4 mg (not administered)  docusate sodium (COLACE) capsule 100 mg (100 mg Oral Given 05/24/14 1004)  metoprolol tartrate (LOPRESSOR) tablet 12.5 mg (12.5 mg Oral Given 05/24/14 1004)  polyethylene glycol (MIRALAX / GLYCOLAX) packet 17 g (17 g Oral Given 05/24/14 1004)  0.9 %  sodium chloride infusion ( Intravenous New Bag/Given 05/21/14 1815)  digoxin (LANOXIN) 0.25 MG/ML injection 0.25 mg (0.25 mg Intravenous Given 05/21/14 1359)  ceFAZolin (ANCEF) IVPB 2 g/50 mL premix (2 g Intravenous Given 05/22/14 1500)  bisacodyl (DULCOLAX) suppository 10 mg (10 mg Rectal Given 05/23/14 0715)  sodium phosphate (FLEET) 7-19 GM/118ML enema 1 enema (1 enema Rectal Given 05/24/14 1000)  Influenza vac split quadrivalent PF (FLUARIX) injection 0.5 mL (0.5 mLs Intramuscular Given 05/24/14 1440)    Patient Vitals for the past 24 hrs:  BP Temp Pulse Resp SpO2  05/24/14 0558 107/68 mmHg 97.9 F (36.6 C) 85 18 95 %  05/23/14 2158 117/62 mmHg 98.5 F (36.9 C) 81 18 99 %   09:20- He declined pain medication  12:45- Discussed with Orthopedics, Dr. August Saucer will likely operate today.  12:50 PM-Consult complete with Hospitalist. Patient case explained and discussed. She agrees to admit patient for further evaluation and treatment. Call ended at 13:10     Labs Review Labs Reviewed  CBC WITH DIFFERENTIAL - Abnormal; Notable for the following:    WBC 11.0 (*)    RBC 3.50 (*)    Hemoglobin 12.9 (*)    HCT 37.3 (*)    MCV 106.6 (*)    MCH 36.9 (*)    Neutrophils Relative % 84 (*)    Neutro Abs 9.2 (*)     Lymphocytes Relative 6 (*)    Monocytes Absolute 1.2 (*)    All other components within normal limits  COMPREHENSIVE METABOLIC PANEL - Abnormal; Notable for the following:    Glucose, Bld 119 (*)    GFR calc non Af Amer 81 (*)    All other components within normal limits  URINALYSIS, ROUTINE W REFLEX MICROSCOPIC - Abnormal; Notable for the following:    APPearance HAZY (*)    Hgb urine dipstick SMALL (*)    Ketones, ur 15 (*)    Leukocytes, UA MODERATE (*)    All other components within normal limits  DIGOXIN LEVEL - Abnormal; Notable for the following:    Digoxin Level 0.4 (*)    All other components within normal limits  URINE MICROSCOPIC-ADD ON - Abnormal; Notable for the following:    Bacteria, UA FEW (*)    All other components within normal limits  CK TOTAL AND CKMB - Abnormal; Notable for the following:    Total CK 246 (*)    CK, MB 4.3 (*)    All other components within normal limits  BASIC METABOLIC PANEL - Abnormal; Notable for the following:    Glucose, Bld 144 (*)    Calcium 8.0 (*)    GFR calc non Af Amer 80 (*)    All other components within normal limits  CBC - Abnormal; Notable for the following:    RBC 2.92 (*)    Hemoglobin 11.0 (*)    HCT 32.2 (*)    MCV 110.3 (*)    MCH 37.7 (*)    Platelets 134 (*)    All other components within normal limits  CBC - Abnormal; Notable for the following:    RBC 2.68 (*)    Hemoglobin 10.2 (*)    HCT 29.8 (*)    MCV 111.2 (*)    MCH 38.1 (*)    Platelets 134 (*)    All other components within normal limits  BASIC METABOLIC PANEL - Abnormal; Notable for the following:    Glucose, Bld 107 (*)    BUN 24 (*)    Calcium 8.0 (*)    GFR calc non Af Amer 61 (*)    GFR calc Af Amer 70 (*)    All other components within normal limits  PROTIME-INR - Abnormal; Notable  for the following:    Prothrombin Time 15.7 (*)    All other components within normal limits  CBC - Abnormal; Notable for the following:    RBC 2.49 (*)     Hemoglobin 9.1 (*)    HCT 27.0 (*)    MCV 108.4 (*)    MCH 36.5 (*)    All other components within normal limits  URINE CULTURE  SURGICAL PCR SCREEN  PROTIME-INR  PROTIME-INR  PROTIME-INR  TYPE AND SCREEN  ABO/RH    Imaging Review No results found.   EKG Interpretation   Date/Time:  Monday May 21 2014 09:54:06 EDT Ventricular Rate:  98 PR Interval:    QRS Duration: 97 QT Interval:  347 QTC Calculation: 443 R Axis:   -48 Text Interpretation:  Atrial fibrillation Left axis deviation Abnormal  R-wave progression, early transition Artifact Serial tracing suggested  Probable Atrial Fibrillation, which was present on a tracing from  07/18/2003 Confirmed by [facility]  MD, Joelie Schou ([id number]) on 05/21/2014 10:09:46  AM      MDM   Final diagnoses:  Hip fracture, left, closed, initial encounter  UTI (urinary tract infection), uncomplicated    Likely mechanical fall, however, cannot rule out another cause, such as dizziness, causing a fall. Screening evaluation today, is possibly consistent with UTI, and is not indicate any other acute medical problems. Urine culture has been ordered. He will be admitted to the hospitalist, and an orthopedic consultation for hip fracture repair.  Nursing Notes Reviewed/ Care Coordinated, and agree without changes. Applicable Imaging Reviewed.  Interpretation of Laboratory Data incorporated into ED treatment  Plan: Admit    Flint Melter, MD 05/24/14 1743

## 2014-05-22 ENCOUNTER — Inpatient Hospital Stay (HOSPITAL_COMMUNITY): Payer: Medicare Other

## 2014-05-22 ENCOUNTER — Encounter (HOSPITAL_COMMUNITY): Payer: Self-pay | Admitting: Orthopedic Surgery

## 2014-05-22 DIAGNOSIS — S72009D Fracture of unspecified part of neck of unspecified femur, subsequent encounter for closed fracture with routine healing: Secondary | ICD-10-CM

## 2014-05-22 LAB — URINE CULTURE

## 2014-05-22 LAB — CBC
HCT: 32.2 % — ABNORMAL LOW (ref 39.0–52.0)
Hemoglobin: 11 g/dL — ABNORMAL LOW (ref 13.0–17.0)
MCH: 37.7 pg — AB (ref 26.0–34.0)
MCHC: 34.2 g/dL (ref 30.0–36.0)
MCV: 110.3 fL — ABNORMAL HIGH (ref 78.0–100.0)
Platelets: 134 10*3/uL — ABNORMAL LOW (ref 150–400)
RBC: 2.92 MIL/uL — AB (ref 4.22–5.81)
RDW: 14.1 % (ref 11.5–15.5)
WBC: 10 10*3/uL (ref 4.0–10.5)

## 2014-05-22 LAB — BASIC METABOLIC PANEL
Anion gap: 14 (ref 5–15)
BUN: 17 mg/dL (ref 6–23)
CO2: 20 mEq/L (ref 19–32)
Calcium: 8 mg/dL — ABNORMAL LOW (ref 8.4–10.5)
Chloride: 104 mEq/L (ref 96–112)
Creatinine, Ser: 0.73 mg/dL (ref 0.50–1.35)
GFR calc non Af Amer: 80 mL/min — ABNORMAL LOW (ref >90)
Glucose, Bld: 144 mg/dL — ABNORMAL HIGH (ref 70–99)
Potassium: 4.4 mEq/L (ref 3.7–5.3)
SODIUM: 138 meq/L (ref 137–147)

## 2014-05-22 LAB — PROTIME-INR
INR: 1.2 (ref 0.00–1.49)
PROTHROMBIN TIME: 15.2 s (ref 11.6–15.2)

## 2014-05-22 MED ORDER — METOPROLOL TARTRATE 12.5 MG HALF TABLET: 12.5000 mg | ORAL_TABLET | Freq: Two times a day (BID) | ORAL | Status: DC

## 2014-05-22 MED ORDER — FENTANYL CITRATE 0.05 MG/ML IJ SOLN: 25.0000 ug | INTRAMUSCULAR | Status: DC | PRN

## 2014-05-22 MED ORDER — DOCUSATE SODIUM 100 MG PO CAPS
100.0000 mg | ORAL_CAPSULE | Freq: Two times a day (BID) | ORAL | Status: DC
  Administered 2014-05-22 – 2014-05-24 (×4): 100 mg via ORAL
  Filled 2014-05-22 (×5): qty 1

## 2014-05-22 MED ORDER — ASPIRIN EC 325 MG PO TBEC
325.0000 mg | DELAYED_RELEASE_TABLET | Freq: Every day | ORAL | Status: DC
  Administered 2014-05-22 – 2014-05-24 (×3): 325 mg via ORAL
  Filled 2014-05-22 (×4): qty 1

## 2014-05-22 MED ORDER — METOCLOPRAMIDE HCL 10 MG PO TABS: 5.0000 mg | ORAL_TABLET | Freq: Three times a day (TID) | ORAL | Status: DC | PRN

## 2014-05-22 MED ORDER — HYDROCODONE-ACETAMINOPHEN 5-325 MG PO TABS: 1.0000 | ORAL_TABLET | Freq: Four times a day (QID) | ORAL | Status: DC | PRN

## 2014-05-22 MED ORDER — ACETAMINOPHEN 650 MG RE SUPP: 650.0000 mg | Freq: Four times a day (QID) | RECTAL | Status: DC | PRN

## 2014-05-22 MED ORDER — MENTHOL 3 MG MT LOZG: 1.0000 | LOZENGE | OROMUCOSAL | Status: DC | PRN

## 2014-05-22 MED ORDER — CEFAZOLIN SODIUM-DEXTROSE 2-3 GM-% IV SOLR
2.0000 g | Freq: Three times a day (TID) | INTRAVENOUS | Status: AC
  Administered 2014-05-22 (×2): 2 g via INTRAVENOUS
  Filled 2014-05-22 (×2): qty 50

## 2014-05-22 MED ORDER — METOCLOPRAMIDE HCL 5 MG/ML IJ SOLN: 5.0000 mg | Freq: Three times a day (TID) | INTRAMUSCULAR | Status: DC | PRN

## 2014-05-22 MED ORDER — ASPIRIN 325 MG PO TBEC: 325.0000 mg | DELAYED_RELEASE_TABLET | Freq: Every day | ORAL | Status: DC

## 2014-05-22 MED ORDER — POTASSIUM CHLORIDE IN NACL 20-0.9 MEQ/L-% IV SOLN
INTRAVENOUS | Status: DC
  Administered 2014-05-22 – 2014-05-23 (×3): via INTRAVENOUS
  Filled 2014-05-22 (×3): qty 1000

## 2014-05-22 MED ORDER — METOPROLOL TARTRATE 12.5 MG HALF TABLET
12.5000 mg | ORAL_TABLET | Freq: Two times a day (BID) | ORAL | Status: DC
  Administered 2014-05-22 – 2014-05-24 (×4): 12.5 mg via ORAL
  Filled 2014-05-22 (×5): qty 1

## 2014-05-22 MED ORDER — HYDROCODONE-ACETAMINOPHEN 5-325 MG PO TABS: 1.0000 | ORAL_TABLET | ORAL | Status: DC | PRN

## 2014-05-22 MED ORDER — MORPHINE SULFATE 2 MG/ML IJ SOLN: 0.5000 mg | INTRAMUSCULAR | Status: DC | PRN

## 2014-05-22 MED ORDER — ONDANSETRON HCL 4 MG PO TABS: 4.0000 mg | ORAL_TABLET | Freq: Four times a day (QID) | ORAL | Status: DC | PRN

## 2014-05-22 MED ORDER — ONDANSETRON HCL 4 MG/2ML IJ SOLN: 4.0000 mg | Freq: Four times a day (QID) | INTRAMUSCULAR | Status: DC | PRN

## 2014-05-22 MED ORDER — ACETAMINOPHEN 325 MG PO TABS: 650.0000 mg | ORAL_TABLET | Freq: Four times a day (QID) | ORAL | Status: DC | PRN

## 2014-05-22 MED ORDER — PHENOL 1.4 % MT LIQD: 1.0000 | OROMUCOSAL | Status: DC | PRN

## 2014-05-22 MED ORDER — ONDANSETRON HCL 4 MG/2ML IJ SOLN: 4.0000 mg | Freq: Once | INTRAMUSCULAR | Status: AC | PRN

## 2014-05-22 NOTE — Op Note (Signed)
NAMEEVERT, WENRICH NO.:  [id number]  MEDICAL RECORD NO.:  [id number]  LOCATION:  [id number]                        FACILITY:  [facility]  PHYSICIAN:  Burnard Bunting, M.D.    DATE OF BIRTH:  18-Feb-1924  DATE OF PROCEDURE: DATE OF DISCHARGE:                              OPERATIVE REPORT   PREOPERATIVE DIAGNOSIS:  Left hip intertrochanteric fracture.  POSTOPERATIVE DIAGNOSIS:  Left hip intertrochanteric fracture.  PROCEDURE:  Left hip intertrochanteric fracture, open reduction and internal fixation.  SURGEON:  Burnard Bunting, M.D.  ASSISTANT:  None.  ANESTHESIA:  General.  INDICATIONS:  Erik Arnold is a patient with left hip fracture, presents for operative management after explanation of risks and benefits.  PROCEDURE IN DETAIL:  The patient was brought to the operating room where general endotracheal anesthesia was induced.  Preoperative antibiotics were administered.  Time-out was called.  Right leg was placed in lithotomy position.  Well-padded peroneal nerve.  Left leg placed under traction and internal rotation.  Under fluoroscopic guidance, reduction was achieved.  Area was prescribed with alcohol and Betadine, allowed to air dry, prepped with DuraPrep solution and draped in sterile fashion.  Incision was made handbreadth proximal to the trochanteric tip.  Skin and subcutaneous tissue were sharply divided. Hip was placed in an ideal position on the trochanteric tip.  Proximal reaming was performed in accordance with preoperative templating.  A 10- mm nail was placed.  Compression screw was then placed across the fracture site with traction released to achieve excellent position. Fracture reduction did improve with lag screw placement and subsequent compression screw placement.  Lag screw placed in good position in the inferior portion of the femoral neck.  Bone quality was good.  At this time, knee was checked and found to be good position of the rod and the rod  was found to be in good position.  Instruments were removed.  Both incisions were irrigated and closed using interrupted inverted 0 Vicryl suture, 2-0 Vicryl suture and skin staples.  The patient tolerated the procedure well without immediate complication, transferred to the recovery room in stable condition.  [employer] were utilized.    Burnard Bunting, M.D.    GSD/MEDQ  D:  05/21/2014  T:  05/22/2014  Job:  [id number]

## 2014-05-22 NOTE — Progress Notes (Signed)
Patient ID: Erik Arnold  male  ZOX:[id number]    DOB: 1923-10-09    DOA: 05/21/2014  PCP: Quintella Reichert, MD  History of present illness Erik Arnold is a 80 y.o. male, with history of chronic Afib, and TIA, who presents with a fall , and found to have Left femur fracture . Orthopedics consulted and patient underwent ORIF 9/21    Assessment/Plan: Principal Problem: Left Hip fracture :-secondary to fall, x-ray revealed Left intertrochanteric femur fracture, postop day 1  - Pain is controlled, continue current regimen - Start PT OT today - Continue bowel regimen - DVT prophylaxis per orthopedics -Lives alone, will likely need short-term rehabilitation  - Advance diet  Chronic atrial fibrillation  -Digoxin level sub therapeutic at 0.4ng/mL, 0.25mg  digoxin ordered 9/21 at 1350, A. fib with RVR is controlled after receiving beta blockers and digoxin .  - Change beta blocker to 12.5MG  bid  History of TIA (transient ischemic attack)  -Denies any new focal deficit, Resume back on aspirin and Plavix once okay with orthopedics  UTI  -continue with rocephin, follow urine culture and sensitivities.   DVT Prophylaxis:Heparin subcutaneous  Code Status:Full code  Family Communication:  Disposition:  Consultants:  Orthopedics  Procedures:  Orif  Antibiotics:  IV Rocephin    Subjective: Patient seen and examined, pain is controlled, frustrated lying on bed  Objective: Weight change:   Intake/Output Summary (Last 24 hours) at 05/22/14 1105 Last data filed at 05/22/14 0440  Gross per 24 hour  Intake   1140 ml  Output   1225 ml  Net    -85 ml   Blood pressure 104/57, pulse 86, temperature 97.8 F (36.6 C), temperature source Axillary, resp. rate 18, SpO2 99.00%.  Physical Exam: General: Alert and awake, oriented x3, not in any acute distress. CVS:  irregularly irregular  Chest: clear to auscultation bilaterally, no wheezing, rales or rhonchi Abdomen: soft nontender,  nondistended, normal bowel sounds  Extremities: no cyanosis, clubbing or edema noted bilaterally   Lab Results: Basic Metabolic Panel:  Recent Labs Lab 05/21/14 1035 05/22/14 0600  NA 139 138  K 4.2 4.4  CL 101 104  CO2 24 20  GLUCOSE 119* 144*  BUN 22 17  CREATININE 0.72 0.73  CALCIUM 8.7 8.0*   Liver Function Tests:  Recent Labs Lab 05/21/14 1035  AST 18  ALT 13  ALKPHOS 57  BILITOT 0.7  PROT 6.6  ALBUMIN 3.6   No results found for this basename: LIPASE, AMYLASE,  in the last 168 hours No results found for this basename: AMMONIA,  in the last 168 hours CBC:  Recent Labs Lab 05/21/14 1035 05/22/14 0600  WBC 11.0* 10.0  NEUTROABS 9.2*  --   HGB 12.9* 11.0*  HCT 37.3* 32.2*  MCV 106.6* 110.3*  PLT 160 134*   Cardiac Enzymes:  Recent Labs Lab 05/21/14 1819  CKTOTAL 246*  CKMB 4.3*   BNP: No components found with this basename: POCBNP,  CBG: No results found for this basename: GLUCAP,  in the last 168 hours   Micro Results: Recent Results (from the past 240 hour(s))  SURGICAL PCR SCREEN     Status: None   Collection Time    05/21/14  8:33 PM      Result Value Ref Range Status   MRSA, PCR NEGATIVE  NEGATIVE Final   Staphylococcus aureus NEGATIVE  NEGATIVE Final   Comment:            The Xpert SA Assay (FDA  approved for NASAL specimens     in patients over 24 years of age),     is one component of     a comprehensive surveillance     program.  Test performance has     been validated by [employer] for patients greater     than or equal to 87 year old.     It is not intended     to diagnose infection nor to     guide or monitor treatment.    Studies/Results: Dg Chest 1 View  05/21/2014   CLINICAL DATA:  Left hip fracture following a fall.  EXAM: CHEST - 1 VIEW  COMPARISON:  None.  FINDINGS: Borderline enlarged cardiac silhouette. Tortuous aorta. Mildly prominent interstitial markings with minimal somewhat patchy density at the  left lateral lung base. Old, healed left inferior rib fractures laterally. No acute fracture or pneumothorax. Thoracic spine and cervical spine degenerative changes.  IMPRESSION: Borderline cardiomegaly, mild chronic interstitial lung disease and small amount atelectasis or scarring at the left lung base.   Electronically Signed   By: Gordan Payment M.D.   On: 05/21/2014 12:01   Dg Hip Complete Left  05/21/2014   CLINICAL DATA:  Fall with left hip fracture.  EXAM: LEFT HIP - COMPLETE 2+ VIEW  COMPARISON:  None.  FINDINGS: There is an intertrochanteric fracture of the left femur with varus angulation of the fracture fragments. No additional evidence of an acute fracture. Bones appear demineralized. Degenerative changes are seen in the lumbosacral spine.  IMPRESSION: 1. Left intertrochanteric femur fracture. 2. Osteopenia. 3. Lumbar spondylosis.   Electronically Signed   By: Leanna Battles M.D.   On: 05/21/2014 11:58   Dg Femur Left  05/22/2014   CLINICAL DATA:  Left hip ORIF  EXAM: DG C-ARM 61-120 MIN; LEFT FEMUR - 2 VIEW  : COMPARISON:  05/21/2014  FINDINGS: Status post ORIF of an intertrochanteric left femur fracture. Fracture alignment is excellent. No evidence of hardware complication, including the periprosthetic fracture.  IMPRESSION: Intertrochanteric left femur fracture status post ORIF. No adverse findings.   Electronically Signed   By: Tiburcio Pea M.D.   On: 05/22/2014 02:36   Dg Femur Left  05/21/2014   CLINICAL DATA:  Fall with left hip fracture.  EXAM: LEFT FEMUR - 2 VIEW  COMPARISON:  None.  FINDINGS: There is an intertrochanteric fracture of the left femur with varus angulation. No dislocation. Bones appear demineralized. Left obturator ring appears grossly intact. Incidental imaging of the left knee reveals mild patellofemoral osteophytosis.  IMPRESSION: Left intertrochanteric femur fracture.  Osteopenia.   Electronically Signed   By: Leanna Battles M.D.   On: 05/21/2014 11:57   Dg  Pelvis Portable  05/22/2014   CLINICAL DATA:  Postop.  EXAM: PORTABLE PELVIS 1-2 VIEWS  COMPARISON:  05/21/2014.  FINDINGS: Status post ORIF of a left femur intertrochanteric fracture. Mildly impacted appearance of the medial fracture compared with intraoperative fluoroscopy is likely related to rotation of the left hip, although there could of also been some mild settling. Dynamic hip screw and intra medullary nail have an unremarkable appearance. The hip is located. No acute bony pelvis findings.  IMPRESSION: Intertrochanteric left femur fracture status post ORIF. No adverse findings.   Electronically Signed   By: Tiburcio Pea M.D.   On: 05/22/2014 03:00   Dg C-arm 1-60 Min  05/22/2014   CLINICAL DATA:  Left hip ORIF  EXAM: DG C-ARM 61-120 MIN; LEFT  FEMUR - 2 VIEW  : COMPARISON:  05/21/2014  FINDINGS: Status post ORIF of an intertrochanteric left femur fracture. Fracture alignment is excellent. No evidence of hardware complication, including the periprosthetic fracture.  IMPRESSION: Intertrochanteric left femur fracture status post ORIF. No adverse findings.   Electronically Signed   By: Tiburcio Pea M.D.   On: 05/22/2014 02:36    Medications: Scheduled Meds: . aspirin EC  325 mg Oral Q breakfast  .  ceFAZolin (ANCEF) IV  2 g Intravenous 3 times per day  . cefTRIAXone (ROCEPHIN)  IV  1 g Intravenous Q24H  . digoxin  0.125 mg Oral Daily  . heparin  5,000 Units Subcutaneous 3 times per day  . metoprolol tartrate  12.5 mg Oral Q6H  . sodium chloride  3 mL Intravenous Q12H      LOS: 1 day   Gladys Gutman M.D. [facility] 05/22/2014, 11:05 AM Pager: [phone]  If 7PM-7AM, please contact night-coverage [email or web address] Password [email or web address]  **Disclaimer: This note was dictated with voice recognition software. Similar sounding words can inadvertently be transcribed and this note may contain transcription errors which may not have been corrected upon publication of note.**

## 2014-05-22 NOTE — Anesthesia Postprocedure Evaluation (Signed)
  Anesthesia Post-op Note  Patient: Erik Arnold  Procedure(s) Performed: Procedure(s): LEFT INTRAMEDULLARY (IM) NAIL INTERTROCHANTRIC (Left)  Patient Location: PACU  Anesthesia Type:General  Level of Consciousness: awake, alert  and oriented  Airway and Oxygen Therapy: Patient Spontanous Breathing and Patient connected to nasal cannula oxygen  Post-op Pain: mild  Post-op Assessment: Post-op Vital signs reviewed, Patient's Cardiovascular Status Stable, Respiratory Function Stable, Patent Airway and Pain level controlled  Post-op Vital Signs: stable  Last Vitals:  Filed Vitals:   05/22/14 0128  BP: 113/49  Pulse: 85  Temp: 36.8 C  Resp: 18    Complications: No apparent anesthesia complications

## 2014-05-22 NOTE — Progress Notes (Signed)
Subjective: Pt stable - less pain with rom hip left   Objective: Vital signs in last 24 hours: Temp:  [97.5 F (36.4 C)-98.3 F (36.8 C)] 98 F (36.7 C) (09/22 1349) Pulse Rate:  [65-101] 65 (09/22 1349) Resp:  [16-23] 18 (09/22 1349) BP: (94-132)/(43-77) 106/77 mmHg (09/22 1349) SpO2:  [94 %-100 %] 100 % (09/22 1349)  Intake/Output from previous day: 09/21 0701 - 09/22 0700 In: 1140 [P.O.:240; I.V.:900] Out: 1225 [Urine:1150; Blood:75] Intake/Output this shift: Total I/O In: 480 [P.O.:480] Out: -   Exam:  Neurovascular intact Sensation intact distally Intact pulses distally  Labs:  Recent Labs  05/21/14 1035 05/22/14 0600  HGB 12.9* 11.0*    Recent Labs  05/21/14 1035 05/22/14 0600  WBC 11.0* 10.0  RBC 3.50* 2.92*  HCT 37.3* 32.2*  PLT 160 134*    Recent Labs  05/21/14 1035 05/22/14 0600  NA 139 138  K 4.2 4.4  CL 101 104  CO2 24 20  BUN 22 17  CREATININE 0.72 0.73  GLUCOSE 119* 144*  CALCIUM 8.7 8.0*    Recent Labs  05/22/14 0600  INR 1.20    Assessment/Plan: Ready for dc to snf pwb on asa rx on chart - f/u 10 d   Asia Favata SCOTT 05/22/2014, 3:56 PM

## 2014-05-22 NOTE — Transfer of Care (Signed)
Immediate Anesthesia Transfer of Care Note  Patient: Erik Arnold  Procedure(s) Performed: Procedure(s): LEFT INTRAMEDULLARY (IM) NAIL INTERTROCHANTRIC (Left)  Patient Location: PACU  Anesthesia Type:General  Level of Consciousness: awake and patient cooperative  Airway & Oxygen Therapy: Patient Spontanous Breathing and Patient connected to face mask oxygen  Post-op Assessment: Report given to PACU RN and Post -op Vital signs reviewed and stable  Post vital signs: Reviewed and stable  Complications: No apparent anesthesia complications

## 2014-05-22 NOTE — Evaluation (Signed)
Physical Therapy Evaluation Patient Details Name: Erik Arnold MRN: [id number] DOB: 02/27/1924 Today's Date: 05/22/2014   History of Present Illness  80 y.o. male presents with left hip intertrochanteric fracture, s/p open reduction internal fixation. Hx of stroke, TIA, and chronic a-fib.  Clinical Impression  Patient is seen following the above procedure and presents with functional limitations due to the deficits listed below (see PT Problem List). Min-mod assist with mobility tasks assessed this AM. Pt able to pivot to chair while maintaining partial weight bearing status at a mod assist level. He does not have 24 hour assistance from family or friends at home. Patient will benefit from skilled PT to increase their independence and safety with mobility to allow discharge to the venue listed below.      Follow Up Recommendations SNF    Equipment Recommendations  3in1 (PT)    Recommendations for Other Services       Precautions / Restrictions Precautions Precautions: Fall Restrictions Weight Bearing Restrictions: Yes LLE Weight Bearing: Partial weight bearing LLE Partial Weight Bearing Percentage or Pounds: 25%      Mobility  Bed Mobility Overal bed mobility: Needs Assistance Bed Mobility: Supine to Sit     Supine to sit: Mod assist;HOB elevated     General bed mobility comments: Mod assist for LLE support out of bed and trunk control to seated position. VC for technique.  Transfers Overall transfer level: Needs assistance Equipment used: Rolling walker (2 wheeled) Transfers: Sit to/from [employer] Sit to Stand: Min assist Stand pivot transfers: Mod assist       General transfer comment: Min assist for boost and balance from lowest bed setting. VC for hand placement. Leans to right and needs cues to maintain 25% WB through LLE. Mod assist for walker control and stability with pivot transfer. Pt with great difficulty backing up to chair after pivot with  loss of balance to posterior. Educated on UE use to decrease WB on RLE but requires physical assist.  Ambulation/Gait                Stairs            Wheelchair Mobility    Modified Rankin (Stroke Patients Only)       Balance Overall balance assessment: Needs assistance;History of Falls Sitting-balance support: No upper extremity supported;Feet supported Sitting balance-Leahy Scale: Fair     Standing balance support: Bilateral upper extremity supported Standing balance-Leahy Scale: Poor                               Pertinent Vitals/Pain Pain Assessment: 0-10 Pain Score: 0-No pain Pain Location: Left hip Pain Intervention(s): Limited activity within patient's tolerance;Monitored during session;Repositioned    Home Living Family/patient expects to be discharged to:: Skilled nursing facility Living Arrangements: Alone Available Help at Discharge: Skilled Nursing Facility Type of Home: House Home Access: Stairs to enter Entrance Stairs-Rails: Doctor, general practice of Steps: 2 Home Layout: One level Home Equipment: Environmental consultant - 2 wheels;Cane - single point;Shower seat      Prior Function Level of Independence: Independent with assistive device(s)         Comments: used cane for ambulation     Hand Dominance   Dominant Hand: Right    Extremity/Trunk Assessment   Upper Extremity Assessment: Defer to OT evaluation           Lower Extremity Assessment: LLE deficits/detail   LLE Deficits /  Details: decreased strength and ROM at hip as expected post op- residual weakness of LLE noticable from previous stroke and pt reports electrical injury in past 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     Communication   Communication: No difficulties  Cognition Arousal/Alertness: Awake/alert Behavior During Therapy: WFL for tasks assessed/performed Overall Cognitive Status: Within Functional Limits for tasks assessed                       General Comments      Exercises General Exercises - Lower Extremity Ankle Circles/Pumps: AROM;Both;10 reps;Seated Quad Sets: Both;10 reps;Seated;Strengthening Gluteal Sets: Strengthening;Both;5 reps;Seated      Assessment/Plan    PT Assessment Patient needs continued PT services  PT Diagnosis Difficulty walking;Abnormality of gait;Acute pain;Hemiplegia non-dominant side   PT Problem List Decreased strength;Decreased range of motion;Decreased activity tolerance;Decreased balance;Decreased mobility;Decreased knowledge of use of DME;Decreased knowledge of precautions;Pain;Impaired tone;Cardiopulmonary status limiting activity  PT Treatment Interventions DME instruction;Gait training;Functional mobility training;Therapeutic activities;Therapeutic exercise;Balance training;Neuromuscular re-education;Patient/family education;Modalities   PT Goals (Current goals can be found in the Care Plan section) Acute Rehab PT Goals Patient Stated Goal: Get better PT Goal Formulation: With patient Time For Goal Achievement: 05/29/14 Potential to Achieve Goals: Good    Frequency Min 3X/week   Barriers to discharge Decreased caregiver support pt lives alone    Co-evaluation               End of Session   Activity Tolerance: Patient tolerated treatment well Patient left: in chair;with call bell/phone within reach Nurse Communication: Mobility status         Time: 1610-9604 PT Time Calculation (min): 38 min   Charges:   PT Evaluation $Initial PT Evaluation Tier I: 1 Procedure PT Treatments $Therapeutic Activity: 23-37 mins   PT G Codes:        Charlsie Merles, [place] [phone]   Berton Mount 05/22/2014, 11:55 AM

## 2014-05-22 NOTE — Progress Notes (Signed)
Utilization review completed.  

## 2014-05-22 NOTE — Progress Notes (Signed)
Report was given by Loleta Chance, RN from PACU.  Patient was awake and cooperative.  Vitals were taken. See vitals in the the doc flowsheet section.

## 2014-05-23 LAB — BASIC METABOLIC PANEL
Anion gap: 9 (ref 5–15)
BUN: 24 mg/dL — ABNORMAL HIGH (ref 6–23)
CALCIUM: 8 mg/dL — AB (ref 8.4–10.5)
CO2: 22 meq/L (ref 19–32)
CREATININE: 1.06 mg/dL (ref 0.50–1.35)
Chloride: 110 mEq/L (ref 96–112)
GFR calc Af Amer: 70 mL/min — ABNORMAL LOW (ref >90)
GFR, EST NON AFRICAN AMERICAN: 61 mL/min — AB (ref >90)
Glucose, Bld: 107 mg/dL — ABNORMAL HIGH (ref 70–99)
Potassium: 4.7 mEq/L (ref 3.7–5.3)
Sodium: 141 mEq/L (ref 137–147)

## 2014-05-23 LAB — CBC
HCT: 29.8 % — ABNORMAL LOW (ref 39.0–52.0)
Hemoglobin: 10.2 g/dL — ABNORMAL LOW (ref 13.0–17.0)
MCH: 38.1 pg — ABNORMAL HIGH (ref 26.0–34.0)
MCHC: 34.2 g/dL (ref 30.0–36.0)
MCV: 111.2 fL — ABNORMAL HIGH (ref 78.0–100.0)
Platelets: 134 10*3/uL — ABNORMAL LOW (ref 150–400)
RBC: 2.68 MIL/uL — ABNORMAL LOW (ref 4.22–5.81)
RDW: 14.5 % (ref 11.5–15.5)
WBC: 10.4 10*3/uL (ref 4.0–10.5)

## 2014-05-23 LAB — PROTIME-INR
INR: 1.25 (ref 0.00–1.49)
Prothrombin Time: 15.7 seconds — ABNORMAL HIGH (ref 11.6–15.2)

## 2014-05-23 MED ORDER — BISACODYL 10 MG RE SUPP
10.0000 mg | Freq: Once | RECTAL | Status: AC
  Administered 2014-05-23: 10 mg via RECTAL
  Filled 2014-05-23: qty 1

## 2014-05-23 MED ORDER — POLYETHYLENE GLYCOL 3350 17 G PO PACK
17.0000 g | PACK | Freq: Every day | ORAL | Status: DC
  Administered 2014-05-23 – 2014-05-24 (×2): 17 g via ORAL
  Filled 2014-05-23 (×3): qty 1

## 2014-05-23 NOTE — Progress Notes (Signed)
OT Cancellation Note  Patient Details Name: Erik Arnold MRN: [id number] DOB: 15-May-1924   Cancelled Treatment:    Reason Eval/Treat Not Completed: Other (comment) Pt is Medicare and current D/C plan is SNF. No apparent immediate acute care OT needs, therefore will defer OT to SNF. If OT eval is needed please call Acute Rehab Dept. at [phone] or text page OT at [phone].   Nena Jordan M   Carney Living, OTR/L Occupational Therapist [phone] 05/23/2014, 8:11 AM

## 2014-05-23 NOTE — Clinical Social Work Note (Signed)
CSW has spoken with pt regarding SNF recommendation. Full assessment to follow. Pt first choice [facility] where he has been before for STR.  CSW will f/u.  Vickii Penna, LCSWA [phone]  Psychiatric & Orthopedics (5N 1-16) Clinical Social Worker

## 2014-05-23 NOTE — Care Management Note (Signed)
CARE MANAGEMENT NOTE 05/23/2014  Patient:  Erik Arnold, Erik Arnold   Account Number:  [id number]  Date Initiated:  05/23/2014  Documentation initiated by:  Vance Peper  Subjective/Objective Assessment:   80 yr old male admitted with left hip fracture, s/p left hip IM Nailing.     Action/Plan:   Patient is for shortterm rehab at SNF, social worker is aware.   Anticipated DC Date:  05/24/2014   Anticipated DC Plan:  SKILLED NURSING FACILITY  In-house referral  Clinical Social Worker      DC Planning Services  CM consult      [facility] Choice  NA   Choice offered to / List presented to:     DME arranged  NA        HH arranged  NA      Status of service:  Completed, signed off Medicare Important Message given?  YES (If response is "NO", the following Medicare IM given date fields will be blank) Date Medicare IM given:  05/23/2014 Medicare IM given by:  Vance Peper Date Additional Medicare IM given:   Additional Medicare IM given by:    Discharge Disposition:  SKILLED NURSING FACILITY  Per UR Regulation:  Reviewed for med. necessity/level of care/duration of stay

## 2014-05-23 NOTE — Progress Notes (Signed)
Patient ID: Erik Arnold  male  EAV:[id number]    DOB: 05/25/1924    DOA: 05/21/2014  PCP: Quintella Reichert, MD  History of present illness Erik Arnold is a 80 y.o. male, with history of chronic Afib, and TIA, who presents with a fall , and found to have Left femur fracture . Orthopedics consulted and patient underwent ORIF 9/21, awaiting SNF    Assessment/Plan: Principal Problem: Left Hip fracture :-secondary to fall, x-ray revealed Left intertrochanteric femur fracture, postop day2 - Status post ORIF left hip intertrochanteric fracture, pain controlled - PTOT evaluation recommended skilled nursing facility - Placed on scheduled Colace, MiraLax and Dulcolax supp for constipation - DVT prophylaxis per orthopedics- ASAS  Chronic atrial fibrillation  - Rate controlled, continue beta blocker, digoxin, on aspirin and Plavix at home   History of TIA (transient ischemic attack)  -Denies any new focal deficit, Resume back on aspirin and Plavix once okay with orthopedics  UTI  -continue with rocephin, urine culture showed 20,000 colonies, will not need any antibiotics at dc   DVT Prophylaxis:Heparin subcutaneous  Code Status:Full code  Family Communication:  Disposition: Skilled nursing facility, awaiting bed  Consultants:  Orthopedics  Procedures:  Orif  Antibiotics:  IV Rocephin    Subjective: Patient seen and examined, pain is controlled, constipation  Objective: Weight change:   Intake/Output Summary (Last 24 hours) at 05/23/14 1103 Last data filed at 05/23/14 1030  Gross per 24 hour  Intake    480 ml  Output   1825 ml  Net  -1345 ml   Blood pressure 128/66, pulse 78, temperature 98.2 F (36.8 C), temperature source Oral, resp. rate 18, SpO2 98.00%.  Physical Exam: General: Alert and awake, oriented x3 NAD CVS:  irregularly irregular  Chest: clear to auscultation bilaterally Abdomen: soft nontender, nondistended, normal bowel sounds  Extremities: no  cyanosis, clubbing or edema noted bilaterally   Lab Results: Basic Metabolic Panel:  Recent Labs Lab 05/22/14 0600 05/23/14 0630  NA 138 141  K 4.4 4.7  CL 104 110  CO2 20 22  GLUCOSE 144* 107*  BUN 17 24*  CREATININE 0.73 1.06  CALCIUM 8.0* 8.0*   Liver Function Tests:  Recent Labs Lab 05/21/14 1035  AST 18  ALT 13  ALKPHOS 57  BILITOT 0.7  PROT 6.6  ALBUMIN 3.6   No results found for this basename: LIPASE, AMYLASE,  in the last 168 hours No results found for this basename: AMMONIA,  in the last 168 hours CBC:  Recent Labs Lab 05/21/14 1035 05/22/14 0600 05/23/14 0630  WBC 11.0* 10.0 10.4  NEUTROABS 9.2*  --   --   HGB 12.9* 11.0* 10.2*  HCT 37.3* 32.2* 29.8*  MCV 106.6* 110.3* 111.2*  PLT 160 134* 134*   Cardiac Enzymes:  Recent Labs Lab 05/21/14 1819  CKTOTAL 246*  CKMB 4.3*   BNP: No components found with this basename: POCBNP,  CBG: No results found for this basename: GLUCAP,  in the last 168 hours   Micro Results: Recent Results (from the past 240 hour(s))  URINE CULTURE     Status: None   Collection Time    05/21/14 10:59 AM      Result Value Ref Range Status   Specimen Description URINE, RANDOM   Final   Special Requests NONE   Final   Culture  Setup Time     Final   Value: 05/21/2014 14:30     Performed at [facility]  Count     Final   Value: 20,OOO COLONIES/ML     Performed at [facility]   Culture     Final   Value: Multiple bacterial morphotypes present, none predominant. Suggest appropriate recollection if clinically indicated.     Performed at [facility]   Report Status 05/22/2014 FINAL   Final  SURGICAL PCR SCREEN     Status: None   Collection Time    05/21/14  8:33 PM      Result Value Ref Range Status   MRSA, PCR NEGATIVE  NEGATIVE Final   Staphylococcus aureus NEGATIVE  NEGATIVE Final   Comment:            The Xpert SA Assay (FDA     approved for NASAL specimens     in  patients over 7 years of age),     is one component of     a comprehensive surveillance     program.  Test performance has     been validated by [employer] for patients greater     than or equal to 8 year old.     It is not intended     to diagnose infection nor to     guide or monitor treatment.    Studies/Results: Dg Chest 1 View  05/21/2014   CLINICAL DATA:  Left hip fracture following a fall.  EXAM: CHEST - 1 VIEW  COMPARISON:  None.  FINDINGS: Borderline enlarged cardiac silhouette. Tortuous aorta. Mildly prominent interstitial markings with minimal somewhat patchy density at the left lateral lung base. Old, healed left inferior rib fractures laterally. No acute fracture or pneumothorax. Thoracic spine and cervical spine degenerative changes.  IMPRESSION: Borderline cardiomegaly, mild chronic interstitial lung disease and small amount atelectasis or scarring at the left lung base.   Electronically Signed   By: Gordan Payment M.D.   On: 05/21/2014 12:01   Dg Hip Complete Left  05/21/2014   CLINICAL DATA:  Fall with left hip fracture.  EXAM: LEFT HIP - COMPLETE 2+ VIEW  COMPARISON:  None.  FINDINGS: There is an intertrochanteric fracture of the left femur with varus angulation of the fracture fragments. No additional evidence of an acute fracture. Bones appear demineralized. Degenerative changes are seen in the lumbosacral spine.  IMPRESSION: 1. Left intertrochanteric femur fracture. 2. Osteopenia. 3. Lumbar spondylosis.   Electronically Signed   By: Leanna Battles M.D.   On: 05/21/2014 11:58   Dg Femur Left  05/22/2014   CLINICAL DATA:  Left hip ORIF  EXAM: DG C-ARM 61-120 MIN; LEFT FEMUR - 2 VIEW  : COMPARISON:  05/21/2014  FINDINGS: Status post ORIF of an intertrochanteric left femur fracture. Fracture alignment is excellent. No evidence of hardware complication, including the periprosthetic fracture.  IMPRESSION: Intertrochanteric left femur fracture status post ORIF. No adverse  findings.   Electronically Signed   By: Tiburcio Pea M.D.   On: 05/22/2014 02:36   Dg Femur Left  05/21/2014   CLINICAL DATA:  Fall with left hip fracture.  EXAM: LEFT FEMUR - 2 VIEW  COMPARISON:  None.  FINDINGS: There is an intertrochanteric fracture of the left femur with varus angulation. No dislocation. Bones appear demineralized. Left obturator ring appears grossly intact. Incidental imaging of the left knee reveals mild patellofemoral osteophytosis.  IMPRESSION: Left intertrochanteric femur fracture.  Osteopenia.   Electronically Signed   By: Leanna Battles M.D.   On: 05/21/2014 11:57   Dg  Pelvis Portable  05/22/2014   CLINICAL DATA:  Postop.  EXAM: PORTABLE PELVIS 1-2 VIEWS  COMPARISON:  05/21/2014.  FINDINGS: Status post ORIF of a left femur intertrochanteric fracture. Mildly impacted appearance of the medial fracture compared with intraoperative fluoroscopy is likely related to rotation of the left hip, although there could of also been some mild settling. Dynamic hip screw and intra medullary nail have an unremarkable appearance. The hip is located. No acute bony pelvis findings.  IMPRESSION: Intertrochanteric left femur fracture status post ORIF. No adverse findings.   Electronically Signed   By: Tiburcio Pea M.D.   On: 05/22/2014 03:00   Dg C-arm 1-60 Min  05/22/2014   CLINICAL DATA:  Left hip ORIF  EXAM: DG C-ARM 61-120 MIN; LEFT FEMUR - 2 VIEW  : COMPARISON:  05/21/2014  FINDINGS: Status post ORIF of an intertrochanteric left femur fracture. Fracture alignment is excellent. No evidence of hardware complication, including the periprosthetic fracture.  IMPRESSION: Intertrochanteric left femur fracture status post ORIF. No adverse findings.   Electronically Signed   By: Tiburcio Pea M.D.   On: 05/22/2014 02:36    Medications: Scheduled Meds: . aspirin EC  325 mg Oral Q breakfast  . bisacodyl  10 mg Rectal Once  . cefTRIAXone (ROCEPHIN)  IV  1 g Intravenous Q24H  . digoxin   0.125 mg Oral Daily  . docusate sodium  100 mg Oral BID  . heparin  5,000 Units Subcutaneous 3 times per day  . metoprolol tartrate  12.5 mg Oral BID  . polyethylene glycol  17 g Oral Daily  . sodium chloride  3 mL Intravenous Q12H      LOS: 2 days   Angie Hogg M.D. [facility] 05/23/2014, 11:03 AM Pager: [phone]  If 7PM-7AM, please contact night-coverage [email or web address] Password [email or web address]  **Disclaimer: This note was dictated with voice recognition software. Similar sounding words can inadvertently be transcribed and this note may contain transcription errors which may not have been corrected upon publication of note.**

## 2014-05-23 NOTE — Progress Notes (Signed)
Pt stable Ready for snf

## 2014-05-24 DIAGNOSIS — S72143A Displaced intertrochanteric fracture of unspecified femur, initial encounter for closed fracture: Secondary | ICD-10-CM | POA: Diagnosis not present

## 2014-05-24 LAB — CBC
HEMATOCRIT: 27 % — AB (ref 39.0–52.0)
HEMOGLOBIN: 9.1 g/dL — AB (ref 13.0–17.0)
MCH: 36.5 pg — ABNORMAL HIGH (ref 26.0–34.0)
MCHC: 33.7 g/dL (ref 30.0–36.0)
MCV: 108.4 fL — AB (ref 78.0–100.0)
Platelets: 164 10*3/uL (ref 150–400)
RBC: 2.49 MIL/uL — AB (ref 4.22–5.81)
RDW: 14.4 % (ref 11.5–15.5)
WBC: 8.3 10*3/uL (ref 4.0–10.5)

## 2014-05-24 LAB — PROTIME-INR
INR: 1.2 (ref 0.00–1.49)
Prothrombin Time: 15.2 seconds (ref 11.6–15.2)

## 2014-05-24 MED ORDER — INFLUENZA VAC SPLIT QUAD 0.5 ML IM SUSY
0.5000 mL | PREFILLED_SYRINGE | Freq: Once | INTRAMUSCULAR | Status: AC
  Administered 2014-05-24: 0.5 mL via INTRAMUSCULAR
  Filled 2014-05-24: qty 0.5

## 2014-05-24 MED ORDER — ASPIRIN 81 MG PO TBEC: 81.0000 mg | DELAYED_RELEASE_TABLET | Freq: Every day | ORAL | Status: DC

## 2014-05-24 MED ORDER — BISACODYL 5 MG PO TBEC: 5.0000 mg | DELAYED_RELEASE_TABLET | Freq: Every day | ORAL | Status: DC | PRN

## 2014-05-24 MED ORDER — POLYETHYLENE GLYCOL 3350 17 G PO PACK: 17.0000 g | PACK | Freq: Every day | ORAL | Status: DC

## 2014-05-24 MED ORDER — DSS 100 MG PO CAPS: 100.0000 mg | ORAL_CAPSULE | Freq: Two times a day (BID) | ORAL | Status: DC

## 2014-05-24 MED ORDER — FLEET ENEMA 7-19 GM/118ML RE ENEM
1.0000 | ENEMA | Freq: Once | RECTAL | Status: AC
  Administered 2014-05-24: 1 via RECTAL

## 2014-05-24 MED ORDER — CLOPIDOGREL BISULFATE 75 MG PO TABS: 75.0000 mg | ORAL_TABLET | Freq: Every day | ORAL | Status: DC

## 2014-05-24 NOTE — Discharge Summary (Signed)
Physician Discharge Summary  Patient ID: Erik Arnold MRN: [id number] DOB/AGE: [birth year] 80 y.o.  Admit date: 05/21/2014 Discharge date: 05/24/2014  Primary Care Physician:  Quintella Reichert, MD  Discharge Diagnoses:    . left Hip fracture . Chronic atrial fibrillation . TIA (transient ischemic attack) . constipation   Consults:  Orthopedics, Dr. August Saucer   Recommendations for Outpatient Follow-up:   Partial weightbearing on left Aspirin 325 mg daily for 2 weeks After 2 weeks, continue aspirin 81 mg daily with Plavix 75 mg daily Followup with Dr. August Saucer in 10 days  Allergies:  No Known Allergies   Discharge Medications:   Medication List         aspirin 325 MG EC tablet  Take 1 tablet (325 mg total) by mouth daily with breakfast.     aspirin 81 MG EC tablet  Take 1 tablet (81 mg total) by mouth daily. Resume after 2 weeks, Swallow whole.     bisacodyl 5 MG EC tablet  Commonly known as:  DULCOLAX  Take 1 tablet (5 mg total) by mouth daily as needed for moderate constipation.     clopidogrel 75 MG tablet  Commonly known as:  PLAVIX  Take 1 tablet (75 mg total) by mouth daily. Resume once on ASA      digoxin 0.125 MG tablet  Commonly known as:  LANOXIN  Take 1 tablet (0.125 mg total) by mouth daily.     DSS 100 MG Caps  Take 100 mg by mouth 2 (two) times daily.     HYDROcodone-acetaminophen 5-325 MG per tablet  Commonly known as:  NORCO/VICODIN  Take 1-2 tablets by mouth every 4 (four) hours as needed for moderate pain.     metoprolol tartrate 25 MG tablet  Commonly known as:  LOPRESSOR  Take 0.5 tablets (12.5 mg total) by mouth 2 (two) times daily.     polyethylene glycol packet  Commonly known as:  MIRALAX / GLYCOLAX  Take 17 g by mouth daily.         Brief H and P: For complete details please refer to admission H and P, but in brief Erik Arnold is a 80 y.o. male, with history of chronic Afib, and TIA, who presents with a fall , and found to have Left  femur fracture in ED. He reported falling overnight when he got up to use the restroom. He had left sided weakness and states that he drags his left leg some at baseline, he denied any dizziness, lightheadedness, no loss of consciousness, no shortness of breath, no chest pain. After falling he reported being unable to ambulate, at which time he dragged himself back to his bed, where he waited 6-7 hours before calling his sister who then called EMS.  Patient is known to have history of A. Fib, not on any anticoagulation secondary to falls, the patient initially was in A. fib with RVR,  controlled after he received IV digoxin, as his level was low, and metoprolol, as he missed his evening and morning dose.   Hospital Course:   Left Hip fracture :-secondary to fall, x-ray revealed Left intertrochanteric femur fracture, postop day3. Patient was admitted and orthopedics was consulted. Patient underwent ORIF left hip intertrochanteric fracture with Dr. August Saucer. PT OT evaluation was done and recommended skilled nursing facility for continued rehabilitation. Per Dr. August Saucer, continue aspirin 325 mg daily for 2 weeks for DVT prophylaxis. After 2 weeks, resume aspirin 81 mg daily with Plavix  Chronic atrial fibrillation  - Rate  controlled, continue beta blocker, digoxin, on aspirin and Plavix, resume after 2 weeks of full dose aspirin  History of TIA (transient ischemic attack)  -Denies any new focal deficit, Resume back on aspirin and Plavix once okay with orthopedics   UTI  -Patient was placed on Rocephin inpatient, urine culture showed 20,000 colonies, will not need any antibiotics at dc      Day of Discharge BP 107/68  Pulse 85  Temp(Src) 97.9 F (36.6 C) (Oral)  Resp 18  SpO2 95%  Physical Exam: General: Alert and awake oriented x3 not in any acute distress. CVS: S1-S2 clear no murmur rubs or gallops Chest: clear to auscultation bilaterally, no wheezing rales or rhonchi Abdomen: soft nontender,  nondistended, normal bowel sounds Extremities: no cyanosis, clubbing or edema noted bilaterally   The results of significant diagnostics from this hospitalization (including imaging, microbiology, ancillary and laboratory) are listed below for reference.    LAB RESULTS: Basic Metabolic Panel:  Recent Labs Lab 05/22/14 0600 05/23/14 0630  NA 138 141  K 4.4 4.7  CL 104 110  CO2 20 22  GLUCOSE 144* 107*  BUN 17 24*  CREATININE 0.73 1.06  CALCIUM 8.0* 8.0*   Liver Function Tests:  Recent Labs Lab 05/21/14 1035  AST 18  ALT 13  ALKPHOS 57  BILITOT 0.7  PROT 6.6  ALBUMIN 3.6   No results found for this basename: LIPASE, AMYLASE,  in the last 168 hours No results found for this basename: AMMONIA,  in the last 168 hours CBC:  Recent Labs Lab 05/21/14 1035  05/23/14 0630 05/24/14 0630  WBC 11.0*  < > 10.4 8.3  NEUTROABS 9.2*  --   --   --   HGB 12.9*  < > 10.2* 9.1*  HCT 37.3*  < > 29.8* 27.0*  MCV 106.6*  < > 111.2* 108.4*  PLT 160  < > 134* 164  < > = values in this interval not displayed. Cardiac Enzymes:  Recent Labs Lab 05/21/14 1819  CKTOTAL 246*  CKMB 4.3*   BNP: No components found with this basename: POCBNP,  CBG: No results found for this basename: GLUCAP,  in the last 168 hours  Significant Diagnostic Studies:  Dg Chest 1 View  05/21/2014   CLINICAL DATA:  Left hip fracture following a fall.  EXAM: CHEST - 1 VIEW  COMPARISON:  None.  FINDINGS: Borderline enlarged cardiac silhouette. Tortuous aorta. Mildly prominent interstitial markings with minimal somewhat patchy density at the left lateral lung base. Old, healed left inferior rib fractures laterally. No acute fracture or pneumothorax. Thoracic spine and cervical spine degenerative changes.  IMPRESSION: Borderline cardiomegaly, mild chronic interstitial lung disease and small amount atelectasis or scarring at the left lung base.   Electronically Signed   By: Gordan Payment M.D.   On: 05/21/2014 12:01    Dg Hip Complete Left  05/21/2014   CLINICAL DATA:  Fall with left hip fracture.  EXAM: LEFT HIP - COMPLETE 2+ VIEW  COMPARISON:  None.  FINDINGS: There is an intertrochanteric fracture of the left femur with varus angulation of the fracture fragments. No additional evidence of an acute fracture. Bones appear demineralized. Degenerative changes are seen in the lumbosacral spine.  IMPRESSION: 1. Left intertrochanteric femur fracture. 2. Osteopenia. 3. Lumbar spondylosis.   Electronically Signed   By: Leanna Battles M.D.   On: 05/21/2014 11:58   Dg Femur Left  05/22/2014   CLINICAL DATA:  Left hip ORIF  EXAM: DG C-ARM  61-120 MIN; LEFT FEMUR - 2 VIEW  : COMPARISON:  05/21/2014  FINDINGS: Status post ORIF of an intertrochanteric left femur fracture. Fracture alignment is excellent. No evidence of hardware complication, including the periprosthetic fracture.  IMPRESSION: Intertrochanteric left femur fracture status post ORIF. No adverse findings.   Electronically Signed   By: Tiburcio Pea M.D.   On: 05/22/2014 02:36   Dg Femur Left  05/21/2014   CLINICAL DATA:  Fall with left hip fracture.  EXAM: LEFT FEMUR - 2 VIEW  COMPARISON:  None.  FINDINGS: There is an intertrochanteric fracture of the left femur with varus angulation. No dislocation. Bones appear demineralized. Left obturator ring appears grossly intact. Incidental imaging of the left knee reveals mild patellofemoral osteophytosis.  IMPRESSION: Left intertrochanteric femur fracture.  Osteopenia.   Electronically Signed   By: Leanna Battles M.D.   On: 05/21/2014 11:57   Dg Pelvis Portable  05/22/2014   CLINICAL DATA:  Postop.  EXAM: PORTABLE PELVIS 1-2 VIEWS  COMPARISON:  05/21/2014.  FINDINGS: Status post ORIF of a left femur intertrochanteric fracture. Mildly impacted appearance of the medial fracture compared with intraoperative fluoroscopy is likely related to rotation of the left hip, although there could of also been some mild settling.  Dynamic hip screw and intra medullary nail have an unremarkable appearance. The hip is located. No acute bony pelvis findings.  IMPRESSION: Intertrochanteric left femur fracture status post ORIF. No adverse findings.   Electronically Signed   By: Tiburcio Pea M.D.   On: 05/22/2014 03:00   Dg C-arm 1-60 Min  05/22/2014   CLINICAL DATA:  Left hip ORIF  EXAM: DG C-ARM 61-120 MIN; LEFT FEMUR - 2 VIEW  : COMPARISON:  05/21/2014  FINDINGS: Status post ORIF of an intertrochanteric left femur fracture. Fracture alignment is excellent. No evidence of hardware complication, including the periprosthetic fracture.  IMPRESSION: Intertrochanteric left femur fracture status post ORIF. No adverse findings.   Electronically Signed   By: Tiburcio Pea M.D.   On: 05/22/2014 02:36       Disposition and Follow-up:     Discharge Instructions   Diet - low sodium heart healthy    Complete by:  As directed      Increase activity slowly    Complete by:  As directed      Partial weight bearing    Complete by:  As directed             DISPOSITION: Skilled nursing facility for rehabilitation  DIET: Heart healthy diet     DISCHARGE FOLLOW-UP Follow-up Information   Follow up with Cammy Copa, MD In 10 days.   Specialty:  Orthopedic Surgery   Contact information:   [address] [place] [address] [phone]       Follow up with Quintella Reichert, MD. Schedule an appointment as soon as possible for a visit in 2 weeks. (for hospital follow-up)    Specialty:  Cardiology   Contact information:   [address] [place] [address] [phone]       Time spent on Discharge: 38 mins  Signed:   Heidee Audi M.D. [facility] 05/24/2014, 8:06 AM Pager: [phone]   **Disclaimer: This note was dictated with voice recognition software. Similar sounding words can inadvertently be transcribed and this note may contain transcription errors which may not have  been corrected upon publication of note.**

## 2014-05-24 NOTE — Clinical Social Work Psychosocial (Signed)
Clinical Social Work Department BRIEF PSYCHOSOCIAL ASSESSMENT 05/24/2014  Patient:  Erik Arnold, Erik Arnold     Account Number:  [id number]     Admit date:  05/21/2014  Clinical Social Worker:  Read Drivers  Date/Time:  05/24/2014 08:24 AM  Referred by:  Physician  Date Referred:  05/24/2014 Referred for  SNF Placement  Psychosocial assessment   Other Referral:   none   Interview type:  Patient Other interview type:   none    PSYCHOSOCIAL DATA Living Status:  ALONE Admitted from facility:   Level of care:   Primary support name:  [place] Primary support relationship to patient:  SIBLING Degree of support available:   strong    CURRENT CONCERNS Current Concerns  Post-Acute Placement   Other Concerns:   none    SOCIAL WORK ASSESSMENT / PLAN CSW assessed pt at bedside.  Pt was alert and oriented x4. Pt states he is from home alone.  His sister is his main support and she herself is battling Polio.  PT recommends SNF.  Pt states that he has been to [facility] in the past and would prefer to go there after being dc. Pt remains hopeful and in good spirits regarding his prognosis.  Pt feels that after STR he will be able to successfully return home to independent living.    Pt gave permission for CSW to conduct a SNF search for [facility].   Assessment/plan status:  Psychosocial Support/Ongoing Assessment of Needs Other assessment/ plan:   FL2  PASARR-existing   Information/referral to community resources:   SNF    PATIENT'S/FAMILY'S RESPONSE TO PLAN OF CARE: Pt was agreeable to SNF search.  First choice is [facility].       Vickii Penna, LCSWA [phone]  Psychiatric & Orthopedics (5N 1-16) Clinical Social Worker

## 2014-05-24 NOTE — Clinical Social Work Note (Addendum)
1:13pm- Pt is being discharged today and admitted to STR/SNF. SNF: [facility] RN to call report to: [phone] Transportation: [facility] scheduled for 2:30pm   Vickii Penna, LCSWA [phone]  Psychiatric & Orthopedics (5N 1-16) Clinical Social Worker    12:09pm- CSW sent dc summary to [facility] [employer], SNF to review.  Vickii Penna, LCSWA [phone]  Psychiatric & Orthopedics (5N 1-16) Clinical Social Worker

## 2014-05-24 NOTE — Clinical Social Work Placement (Addendum)
Clinical Social Work Department CLINICAL SOCIAL WORK PLACEMENT NOTE 05/24/2014  Patient:  Erik Arnold, Erik Arnold  Account Number:  [id number] Admit date:  05/21/2014  Clinical Social Worker:  Read Drivers  Date/time:  05/24/2014 08:28 AM  Clinical Social Work is seeking post-discharge placement for this patient at the following level of care:   SKILLED NURSING   (*CSW will update this form in Epic as items are completed)   05/24/2014  Patient/family provided with [facility] Department of Clinical Social Work's list of facilities offering this level of care within the geographic area requested by the patient (or if unable, by the patient's family).  05/24/2014  Patient/family informed of their freedom to choose among providers that offer the needed level of care, that participate in Medicare, Medicaid or managed care program needed by the patient, have an available bed and are willing to accept the patient.  05/24/2014  Patient/family informed of [facility]' ownership interest in [facility], as well as of the fact that they are under no obligation to receive care at this facility.  PASARR submitted to EDS on 05/24/2014 PASARR number received on 05/24/2014  FL2 transmitted to all facilities in geographic area requested by pt/family on  05/24/2014 FL2 transmitted to all facilities within larger geographic area on   Patient informed that his/her managed care company has contracts with or will negotiate with  certain facilities, including the following:     Patient/family informed of bed offers received:  05/24/2014 Patient chooses bed at [facility] Physician recommends and patient chooses bed at  n/a  Patient to be transferred to  [facility] on  05/24/2014 Patient to be transferred to facility by [facility] Patient and family notified of transfer on pt is alert and oriented x 4 no family notified. Name of family member notified:  None pt alert and oriented x4.   Pt states no one to contact.  The following physician request were entered in Epic:   Additional Comments:  Vickii Penna, LCSWA [phone]  Psychiatric & Orthopedics (5N 1-16) Clinical Social Worker

## 2014-05-24 NOTE — Progress Notes (Signed)
Patient being discharged to [facility] via [facility] for continued rehabilitation.

## 2014-05-30 ENCOUNTER — Inpatient Hospital Stay (HOSPITAL_COMMUNITY)
Admission: EM | Admit: 2014-05-30 | Discharge: 2014-06-04 | DRG: 682 | Disposition: A | Discharge status: Skilled Nursing Facility | Payer: Medicare Other | Attending: Family Medicine | Admitting: Family Medicine

## 2014-05-30 ENCOUNTER — Encounter (HOSPITAL_COMMUNITY): Payer: Self-pay | Admitting: Emergency Medicine

## 2014-05-30 ENCOUNTER — Inpatient Hospital Stay (HOSPITAL_COMMUNITY): Payer: Medicare Other

## 2014-05-30 ENCOUNTER — Emergency Department (HOSPITAL_COMMUNITY): Payer: Medicare Other

## 2014-05-30 DIAGNOSIS — R001 Bradycardia, unspecified: Secondary | ICD-10-CM | POA: Diagnosis present

## 2014-05-30 DIAGNOSIS — R740 Nonspecific elevation of levels of transaminase and lactic acid dehydrogenase [LDH]: Secondary | ICD-10-CM | POA: Diagnosis present

## 2014-05-30 DIAGNOSIS — J9601 Acute respiratory failure with hypoxia: Secondary | ICD-10-CM | POA: Diagnosis present

## 2014-05-30 DIAGNOSIS — E872 Acidosis, unspecified: Secondary | ICD-10-CM | POA: Diagnosis present

## 2014-05-30 DIAGNOSIS — D72829 Elevated white blood cell count, unspecified: Secondary | ICD-10-CM | POA: Diagnosis present

## 2014-05-30 DIAGNOSIS — R338 Other retention of urine: Secondary | ICD-10-CM | POA: Diagnosis present

## 2014-05-30 DIAGNOSIS — Z823 Family history of stroke: Secondary | ICD-10-CM | POA: Diagnosis not present

## 2014-05-30 DIAGNOSIS — R0600 Dyspnea, unspecified: Secondary | ICD-10-CM

## 2014-05-30 DIAGNOSIS — N401 Enlarged prostate with lower urinary tract symptoms: Secondary | ICD-10-CM | POA: Diagnosis present

## 2014-05-30 DIAGNOSIS — R0602 Shortness of breath: Secondary | ICD-10-CM | POA: Diagnosis present

## 2014-05-30 DIAGNOSIS — I482 Chronic atrial fibrillation, unspecified: Secondary | ICD-10-CM | POA: Diagnosis present

## 2014-05-30 DIAGNOSIS — E871 Hypo-osmolality and hyponatremia: Secondary | ICD-10-CM | POA: Diagnosis present

## 2014-05-30 DIAGNOSIS — F17220 Nicotine dependence, chewing tobacco, uncomplicated: Secondary | ICD-10-CM | POA: Diagnosis present

## 2014-05-30 DIAGNOSIS — Z66 Do not resuscitate: Secondary | ICD-10-CM | POA: Diagnosis present

## 2014-05-30 DIAGNOSIS — N138 Other obstructive and reflux uropathy: Secondary | ICD-10-CM | POA: Diagnosis present

## 2014-05-30 DIAGNOSIS — I4891 Unspecified atrial fibrillation: Secondary | ICD-10-CM

## 2014-05-30 DIAGNOSIS — R531 Weakness: Secondary | ICD-10-CM | POA: Diagnosis present

## 2014-05-30 DIAGNOSIS — R310 Gross hematuria: Secondary | ICD-10-CM | POA: Diagnosis present

## 2014-05-30 DIAGNOSIS — Z8673 Personal history of transient ischemic attack (TIA), and cerebral infarction without residual deficits: Secondary | ICD-10-CM

## 2014-05-30 DIAGNOSIS — Z791 Long term (current) use of non-steroidal anti-inflammatories (NSAID): Secondary | ICD-10-CM

## 2014-05-30 DIAGNOSIS — E874 Mixed disorder of acid-base balance: Secondary | ICD-10-CM | POA: Diagnosis present

## 2014-05-30 DIAGNOSIS — Z7982 Long term (current) use of aspirin: Secondary | ICD-10-CM | POA: Diagnosis not present

## 2014-05-30 DIAGNOSIS — N179 Acute kidney failure, unspecified: Secondary | ICD-10-CM | POA: Diagnosis present

## 2014-05-30 DIAGNOSIS — I4510 Unspecified right bundle-branch block: Secondary | ICD-10-CM | POA: Diagnosis present

## 2014-05-30 DIAGNOSIS — N320 Bladder-neck obstruction: Secondary | ICD-10-CM | POA: Diagnosis present

## 2014-05-30 DIAGNOSIS — R0989 Other specified symptoms and signs involving the circulatory and respiratory systems: Secondary | ICD-10-CM

## 2014-05-30 DIAGNOSIS — E875 Hyperkalemia: Secondary | ICD-10-CM | POA: Diagnosis present

## 2014-05-30 DIAGNOSIS — I5031 Acute diastolic (congestive) heart failure: Secondary | ICD-10-CM | POA: Diagnosis present

## 2014-05-30 DIAGNOSIS — R0609 Other forms of dyspnea: Secondary | ICD-10-CM

## 2014-05-30 DIAGNOSIS — R7302 Impaired glucose tolerance (oral): Secondary | ICD-10-CM | POA: Diagnosis present

## 2014-05-30 DIAGNOSIS — R7401 Elevation of levels of liver transaminase levels: Secondary | ICD-10-CM

## 2014-05-30 DIAGNOSIS — S72002A Fracture of unspecified part of neck of left femur, initial encounter for closed fracture: Secondary | ICD-10-CM

## 2014-05-30 DIAGNOSIS — I5030 Unspecified diastolic (congestive) heart failure: Secondary | ICD-10-CM | POA: Diagnosis present

## 2014-05-30 DIAGNOSIS — D500 Iron deficiency anemia secondary to blood loss (chronic): Secondary | ICD-10-CM | POA: Diagnosis present

## 2014-05-30 DIAGNOSIS — N139 Obstructive and reflux uropathy, unspecified: Secondary | ICD-10-CM | POA: Diagnosis present

## 2014-05-30 DIAGNOSIS — G459 Transient cerebral ischemic attack, unspecified: Secondary | ICD-10-CM | POA: Diagnosis present

## 2014-05-30 LAB — URINALYSIS, ROUTINE W REFLEX MICROSCOPIC
BILIRUBIN URINE: NEGATIVE
Glucose, UA: NEGATIVE mg/dL
Ketones, ur: NEGATIVE mg/dL
Leukocytes, UA: NEGATIVE
NITRITE: NEGATIVE
PH: 5.5 (ref 5.0–8.0)
PROTEIN: NEGATIVE mg/dL
Specific Gravity, Urine: 1.008 (ref 1.005–1.030)
UROBILINOGEN UA: 0.2 mg/dL (ref 0.0–1.0)

## 2014-05-30 LAB — RENAL FUNCTION PANEL
ALBUMIN: 2.7 g/dL — AB (ref 3.5–5.2)
Anion gap: 21 — ABNORMAL HIGH (ref 5–15)
BUN: 111 mg/dL — ABNORMAL HIGH (ref 6–23)
CALCIUM: 9 mg/dL (ref 8.4–10.5)
CHLORIDE: 95 meq/L — AB (ref 96–112)
CO2: 18 meq/L — AB (ref 19–32)
Creatinine, Ser: 6.11 mg/dL — ABNORMAL HIGH (ref 0.50–1.35)
GFR, EST AFRICAN AMERICAN: 8 mL/min — AB (ref >90)
GFR, EST NON AFRICAN AMERICAN: 7 mL/min — AB (ref >90)
Glucose, Bld: 87 mg/dL (ref 70–99)
Phosphorus: 5.9 mg/dL — ABNORMAL HIGH (ref 2.3–4.6)
Potassium: 5.5 mEq/L — ABNORMAL HIGH (ref 3.7–5.3)
SODIUM: 134 meq/L — AB (ref 137–147)

## 2014-05-30 LAB — CBC WITH DIFFERENTIAL/PLATELET
BASOS ABS: 0.1 10*3/uL (ref 0.0–0.1)
BASOS PCT: 1 % (ref 0–1)
EOS PCT: 2 % (ref 0–5)
Eosinophils Absolute: 0.3 10*3/uL (ref 0.0–0.7)
HCT: 30.6 % — ABNORMAL LOW (ref 39.0–52.0)
Hemoglobin: 10.6 g/dL — ABNORMAL LOW (ref 13.0–17.0)
LYMPHS PCT: 11 % — AB (ref 12–46)
Lymphs Abs: 1.6 10*3/uL (ref 0.7–4.0)
MCH: 37.6 pg — ABNORMAL HIGH (ref 26.0–34.0)
MCHC: 34.6 g/dL (ref 30.0–36.0)
MCV: 108.5 fL — ABNORMAL HIGH (ref 78.0–100.0)
MONO ABS: 2.2 10*3/uL — AB (ref 0.1–1.0)
Monocytes Relative: 15 % — ABNORMAL HIGH (ref 3–12)
NEUTROS ABS: 10.6 10*3/uL — AB (ref 1.7–7.7)
Neutrophils Relative %: 71 % (ref 43–77)
PLATELETS: 320 10*3/uL (ref 150–400)
RBC: 2.82 MIL/uL — ABNORMAL LOW (ref 4.22–5.81)
RDW: 14.8 % (ref 11.5–15.5)
WBC: 14.7 10*3/uL — AB (ref 4.0–10.5)

## 2014-05-30 LAB — COMPREHENSIVE METABOLIC PANEL
ALBUMIN: 2.6 g/dL — AB (ref 3.5–5.2)
ALT: 11 U/L (ref 0–53)
ALT: 10 U/L (ref 0–53)
ANION GAP: 22 — AB (ref 5–15)
AST: 18 U/L (ref 0–37)
AST: 17 U/L (ref 0–37)
Albumin: 2.8 g/dL — ABNORMAL LOW (ref 3.5–5.2)
Alkaline Phosphatase: 85 U/L (ref 39–117)
Alkaline Phosphatase: 78 U/L (ref 39–117)
Anion gap: 19 — ABNORMAL HIGH (ref 5–15)
BILIRUBIN TOTAL: 0.4 mg/dL (ref 0.3–1.2)
BUN: 130 mg/dL — AB (ref 6–23)
BUN: 105 mg/dL — ABNORMAL HIGH (ref 6–23)
CALCIUM: 9 mg/dL (ref 8.4–10.5)
CHLORIDE: 98 meq/L (ref 96–112)
CO2: 17 mEq/L — ABNORMAL LOW (ref 19–32)
CO2: 22 mEq/L (ref 19–32)
CREATININE: 5.47 mg/dL — AB (ref 0.50–1.35)
Calcium: 9.5 mg/dL (ref 8.4–10.5)
Chloride: 94 mEq/L — ABNORMAL LOW (ref 96–112)
Creatinine, Ser: 8.44 mg/dL — ABNORMAL HIGH (ref 0.50–1.35)
GFR calc Af Amer: 6 mL/min — ABNORMAL LOW (ref >90)
GFR calc Af Amer: 10 mL/min — ABNORMAL LOW (ref >90)
GFR calc non Af Amer: 5 mL/min — ABNORMAL LOW (ref >90)
GFR, EST NON AFRICAN AMERICAN: 8 mL/min — AB (ref >90)
GLUCOSE: 81 mg/dL (ref 70–99)
Glucose, Bld: 97 mg/dL (ref 70–99)
Potassium: 6.4 mEq/L — ABNORMAL HIGH (ref 3.7–5.3)
Potassium: 5.9 mEq/L — ABNORMAL HIGH (ref 3.7–5.3)
Sodium: 133 mEq/L — ABNORMAL LOW (ref 137–147)
Sodium: 139 mEq/L (ref 137–147)
TOTAL PROTEIN: 6.6 g/dL (ref 6.0–8.3)
Total Bilirubin: 0.5 mg/dL (ref 0.3–1.2)
Total Protein: 6.2 g/dL (ref 6.0–8.3)

## 2014-05-30 LAB — URINE MICROSCOPIC-ADD ON

## 2014-05-30 LAB — HEPATIC FUNCTION PANEL
ALT: 11 U/L (ref 0–53)
AST: 19 U/L (ref 0–37)
Albumin: 2.8 g/dL — ABNORMAL LOW (ref 3.5–5.2)
Alkaline Phosphatase: 88 U/L (ref 39–117)
TOTAL PROTEIN: 6.7 g/dL (ref 6.0–8.3)
Total Bilirubin: 0.4 mg/dL (ref 0.3–1.2)

## 2014-05-30 LAB — PRO B NATRIURETIC PEPTIDE: Pro B Natriuretic peptide (BNP): 3632 pg/mL — ABNORMAL HIGH (ref 0–450)

## 2014-05-30 LAB — I-STAT ARTERIAL BLOOD GAS, ED
ACID-BASE DEFICIT: 13 mmol/L — AB (ref 0.0–2.0)
Bicarbonate: 11.6 mEq/L — ABNORMAL LOW (ref 20.0–24.0)
O2 Saturation: 100 %
PO2 ART: 216 mmHg — AB (ref 80.0–100.0)
TCO2: 12 mmol/L (ref 0–100)
pCO2 arterial: 22.6 mmHg — ABNORMAL LOW (ref 35.0–45.0)
pH, Arterial: 7.318 — ABNORMAL LOW (ref 7.350–7.450)

## 2014-05-30 LAB — PHOSPHORUS: PHOSPHORUS: 7.1 mg/dL — AB (ref 2.3–4.6)

## 2014-05-30 LAB — BASIC METABOLIC PANEL
Anion gap: 25 — ABNORMAL HIGH (ref 5–15)
BUN: 140 mg/dL — ABNORMAL HIGH (ref 6–23)
CO2: 11 mEq/L — ABNORMAL LOW (ref 19–32)
CREATININE: 10.68 mg/dL — AB (ref 0.50–1.35)
Calcium: 8.4 mg/dL (ref 8.4–10.5)
Chloride: 92 mEq/L — ABNORMAL LOW (ref 96–112)
GFR calc non Af Amer: 4 mL/min — ABNORMAL LOW (ref >90)
GFR, EST AFRICAN AMERICAN: 4 mL/min — AB (ref >90)
Glucose, Bld: 110 mg/dL — ABNORMAL HIGH (ref 70–99)
Sodium: 128 mEq/L — ABNORMAL LOW (ref 137–147)

## 2014-05-30 LAB — PROTIME-INR
INR: 2.8 — ABNORMAL HIGH (ref 0.00–1.49)
PROTHROMBIN TIME: 29.5 s — AB (ref 11.6–15.2)

## 2014-05-30 LAB — TROPONIN I

## 2014-05-30 LAB — CREATININE, URINE, RANDOM: CREATININE, URINE: 36.18 mg/dL

## 2014-05-30 LAB — SODIUM, URINE, RANDOM: SODIUM UR: 45 meq/L

## 2014-05-30 LAB — I-STAT TROPONIN, ED: TROPONIN I, POC: 0.01 ng/mL (ref 0.00–0.08)

## 2014-05-30 LAB — I-STAT CG4 LACTIC ACID, ED: LACTIC ACID, VENOUS: 1.09 mmol/L (ref 0.5–2.2)

## 2014-05-30 LAB — MAGNESIUM: Magnesium: 2.9 mg/dL — ABNORMAL HIGH (ref 1.5–2.5)

## 2014-05-30 MED ORDER — FUROSEMIDE 10 MG/ML IJ SOLN
40.0000 mg | Freq: Every day | INTRAMUSCULAR | Status: DC
  Administered 2014-05-31: 40 mg via INTRAVENOUS
  Filled 2014-05-30 (×2): qty 4

## 2014-05-30 MED ORDER — SODIUM CHLORIDE 0.9 % IV SOLN
1.0000 g | Freq: Once | INTRAVENOUS | Status: AC
  Administered 2014-05-30: 1 g via INTRAVENOUS
  Filled 2014-05-30: qty 10

## 2014-05-30 MED ORDER — STERILE WATER FOR INJECTION IV SOLN
INTRAVENOUS | Status: DC
  Administered 2014-05-30 – 2014-05-31 (×2): via INTRAVENOUS
  Filled 2014-05-30 (×4): qty 850

## 2014-05-30 MED ORDER — INSULIN ASPART 100 UNIT/ML ~~LOC~~ SOLN
10.0000 [IU] | Freq: Once | SUBCUTANEOUS | Status: AC
  Administered 2014-05-30: 10 [IU] via SUBCUTANEOUS
  Filled 2014-05-30: qty 1

## 2014-05-30 MED ORDER — SODIUM POLYSTYRENE SULFONATE 15 GM/60ML PO SUSP
15.0000 g | Freq: Once | ORAL | Status: AC
  Administered 2014-05-30: 15 g via ORAL
  Filled 2014-05-30: qty 60

## 2014-05-30 MED ORDER — SODIUM POLYSTYRENE SULFONATE 15 GM/60ML PO SUSP: 30.0000 g | Freq: Four times a day (QID) | ORAL | Status: DC

## 2014-05-30 MED ORDER — DEXTROSE 50 % IV SOLN
25.0000 mL | Freq: Once | INTRAVENOUS | Status: AC
  Administered 2014-05-30: 25 mL via INTRAVENOUS
  Filled 2014-05-30: qty 50

## 2014-05-30 MED ORDER — METOPROLOL TARTRATE 12.5 MG HALF TABLET
12.5000 mg | ORAL_TABLET | Freq: Two times a day (BID) | ORAL | Status: DC
  Administered 2014-05-30 – 2014-06-04 (×9): 12.5 mg via ORAL
  Filled 2014-05-30 (×12): qty 1

## 2014-05-30 MED ORDER — DILTIAZEM HCL 30 MG PO TABS: 30.0000 mg | ORAL_TABLET | Freq: Four times a day (QID) | ORAL | Status: DC

## 2014-05-30 MED ORDER — CLOPIDOGREL BISULFATE 75 MG PO TABS: 75.0000 mg | ORAL_TABLET | Freq: Every day | ORAL | Status: DC

## 2014-05-30 MED ORDER — SODIUM BICARBONATE 8.4 % IV SOLN
25.0000 meq | Freq: Once | INTRAVENOUS | Status: AC
  Administered 2014-05-30: 25 meq via INTRAVENOUS
  Filled 2014-05-30: qty 50

## 2014-05-30 MED ORDER — SODIUM CHLORIDE 0.9 % IV SOLN: INTRAVENOUS | Status: DC

## 2014-05-30 MED ORDER — TECHNETIUM TO 99M ALBUMIN AGGREGATED
6.0000 | Freq: Once | INTRAVENOUS | Status: AC | PRN
  Administered 2014-05-30: 6 via INTRAVENOUS

## 2014-05-30 MED ORDER — FUROSEMIDE 10 MG/ML IJ SOLN
80.0000 mg | Freq: Once | INTRAMUSCULAR | Status: AC
  Administered 2014-05-30: 80 mg via INTRAVENOUS
  Filled 2014-05-30: qty 8

## 2014-05-30 MED ORDER — CETYLPYRIDINIUM CHLORIDE 0.05 % MT LIQD
7.0000 mL | Freq: Two times a day (BID) | OROMUCOSAL | Status: DC
  Administered 2014-05-30 – 2014-06-04 (×10): 7 mL via OROMUCOSAL

## 2014-05-30 MED ORDER — TECHNETIUM TC 99M DIETHYLENETRIAME-PENTAACETIC ACID: 40.0000 | Freq: Once | INTRAVENOUS | Status: AC | PRN

## 2014-05-30 NOTE — ED Provider Notes (Signed)
CSN: [id number]     Arrival date & time 05/30/14  4098 History   First MD Initiated Contact with Patient 05/30/14 [phone]     Chief Complaint  Patient presents with  . Shortness of Breath    t) HPI  Sugars for evaluation of shortness of breath. Feels short of breath this morning. Recently to rehabilitation facility after hip surgery. Cannot tell me when he last urinated. Short of breath acutely this morning.nebulized albuterol in route. No chest pain. No fever. Does have a cough. No pain at his hip. He is anticoagulated with Coumadin.  Past Medical History  Diagnosis Date  . Stroke 08/22/2012    residual left sided weakness (08/23/2012)  . TIA (transient ischemic attack)   . Carpal tunnel syndrome     diagnosed in 1997  . Chronic atrial fibrillation     Not on coumadin as he was deemed a fall risk after he fell in his cardiologists office.   Past Surgical History  Procedure Laterality Date  . Inguinal hernia repair  ~ 1992    "left" (08/23/2012)  . Cataract extraction w/ intraocular lens  implant, bilateral  ~ 2005  . Proctosigmoidoscopy      November 2001 normal screening proctocolonoscopy to the cecum   . Intramedullary (im) nail intertrochanteric Left 05/21/2014    Procedure: LEFT INTRAMEDULLARY (IM) NAIL INTERTROCHANTRIC;  Surgeon: Cammy Copa, MD;  Location: [facility] OR;  Service: Orthopedics;  Laterality: Left;   Family History  Problem Relation Age of Onset  . CVA Mother   . Pneumonia Father    History  Substance Use Topics  . Smoking status: Former Smoker -- 5 years    Types: Pipe  . Smokeless tobacco: Former Neurosurgeon    Types: Chew     Comment: 08/23/2012 "smoked a little pipe in my 20's; haven't chewed in ~ 11month"  . Alcohol Use: No     Comment: 08/23/2012 "last alcohol was a beer in 1980; never had problems w/it"    Review of Systems  Constitutional: Negative for fever, chills, diaphoresis, appetite change and fatigue.  HENT: Negative for mouth sores, sore  throat and trouble swallowing.   Eyes: Negative for visual disturbance.  Respiratory: Positive for chest tightness and shortness of breath. Negative for cough and wheezing.   Cardiovascular: Negative for chest pain.  Gastrointestinal: Negative for nausea, vomiting, abdominal pain, diarrhea and abdominal distention.  Endocrine: Negative for polydipsia, polyphagia and polyuria.  Genitourinary: Positive for decreased urine volume. Negative for dysuria, frequency and hematuria.  Musculoskeletal: Negative for gait problem.  Skin: Negative for color change, pallor and rash.  Neurological: Positive for weakness. Negative for dizziness, syncope, light-headedness and headaches.  Hematological: Does not bruise/bleed easily.  Psychiatric/Behavioral: Negative for behavioral problems and confusion.      Allergies  Review of patient's allergies indicates no known allergies.  Home Medications   Prior to Admission medications   Medication Sig Start Date End Date Taking? Authorizing Provider  aspirin EC 325 MG EC tablet Take 1 tablet (325 mg total) by mouth daily with breakfast. 05/22/14  Yes Cammy Copa, MD  bisacodyl (DULCOLAX) 5 MG EC tablet Take 1 tablet (5 mg total) by mouth daily as needed for moderate constipation. 05/24/14  Yes Ripudeep Jenna Luo, MD  digoxin (LANOXIN) 0.125 MG tablet Take 1 tablet (0.125 mg total) by mouth daily. 05/17/14  Yes Quintella Reichert, MD  docusate sodium 100 MG CAPS Take 100 mg by mouth 2 (two) times daily. 05/24/14  Yes Ripudeep Jenna LuoK Rai, MD  HYDROcodone-acetaminophen (NORCO/VICODIN) 5-325 MG per tablet Take 1-2 tablets by mouth every 4 (four) hours as needed for moderate pain. 05/22/14  Yes Cammy CopaGregory Scott Dean, MD  iron polysaccharides (NIFEREX) 150 MG capsule Take 150 mg by mouth daily.   Yes Historical Provider, MD  metoprolol tartrate (LOPRESSOR) 25 MG tablet Take 0.5 tablets (12.5 mg total) by mouth 2 (two) times daily. 05/17/14  Yes Quintella Reichertraci R Turner, MD  polyethylene  glycol (MIRALAX / GLYCOLAX) packet Take 17 g by mouth daily. 05/24/14  Yes Ripudeep Jenna LuoK Rai, MD  senna (SENOKOT) 8.6 MG TABS tablet Take 1 tablet by mouth daily.   Yes Historical Provider, MD  warfarin (COUMADIN) 4 MG tablet Take 4 mg by mouth daily.   Yes Historical Provider, MD  aspirin 81 MG EC tablet Take 1 tablet (81 mg total) by mouth daily. Resume after 2 weeks, Swallow whole. 05/24/14   Ripudeep Jenna LuoK Rai, MD  clopidogrel (PLAVIX) 75 MG tablet Take 1 tablet (75 mg total) by mouth daily. Resume once on ASA 81mg  05/24/14   Ripudeep K Rai, MD   BP 88/47  Pulse 131  Temp(Src) 98.5 F (36.9 C) (Oral)  Resp 22  SpO2 100% Physical Exam  Constitutional: He is oriented to person, place, and time. He appears well-developed and well-nourished. No distress.  HENT:  Head: Normocephalic.  Eyes: Conjunctivae are normal. Pupils are equal, round, and reactive to light. No scleral icterus.  Neck: Normal range of motion. Neck supple. No thyromegaly present.  Cardiovascular: Normal rate and regular rhythm.  Exam reveals no gallop and no friction rub.   No murmur heard. On the monitor.  Pulmonary/Chest: Effort normal and breath sounds normal. No respiratory distress. He has no wheezes. He has no rales.  With globally diminished breath sounds  Abdominal: Soft. Bowel sounds are normal. He exhibits no distension. There is no tenderness. There is no rebound.    Musculoskeletal: Normal range of motion.  Neurological: He is alert and oriented to person, place, and time.  Skin: Skin is warm and dry. No rash noted.  Bilateral lower extremity edema  Psychiatric: He has a normal mood and affect. His behavior is normal.    ED Course  CRITICAL CARE Performed by: Rolland PorterJAMES, Elayjah Chaney Authorized by: Fayrene FearingJAMES, Loraine LericheMARK Critical care start time: 05/30/2014 10:00 AM Critical care end time: 05/30/2014 11:30 AM Critical care time was exclusive of separately billable procedures and treating other patients. Critical care was necessary  to treat or prevent imminent or life-threatening deterioration of the following conditions: Congestive heart failure, hyperkalemia, acute renal failure, urinary retention. Critical care was time spent personally by me on the following activities: blood draw for specimens, development of treatment plan with patient or surrogate, discussions with consultants, discussions with primary provider, interpretation of cardiac output measurements, evaluation of patient's response to treatment, examination of patient, obtaining history from patient or surrogate, ordering and performing treatments and interventions, ordering and review of laboratory studies, ordering and review of radiographic studies, pulse oximetry, re-evaluation of patient's condition and review of old charts.   (including critical care time) Labs Review Labs Reviewed  PRO B NATRIURETIC PEPTIDE - Abnormal; Notable for the following:    Pro B Natriuretic peptide (BNP) 3632.0 (*)    All other components within normal limits  PROTIME-INR - Abnormal; Notable for the following:    Prothrombin Time 29.5 (*)    INR 2.80 (*)    All other components within normal limits  CBC WITH  DIFFERENTIAL - Abnormal; Notable for the following:    WBC 14.7 (*)    RBC 2.82 (*)    Hemoglobin 10.6 (*)    HCT 30.6 (*)    MCV 108.5 (*)    MCH 37.6 (*)    Neutro Abs 10.6 (*)    Lymphocytes Relative 11 (*)    Monocytes Relative 15 (*)    Monocytes Absolute 2.2 (*)    All other components within normal limits  BASIC METABOLIC PANEL - Abnormal; Notable for the following:    Sodium 128 (*)    Potassium >7.7 (*)    Chloride 92 (*)    CO2 11 (*)    Glucose, Bld 110 (*)    BUN 140 (*)    Creatinine, Ser 10.68 (*)    GFR calc non Af Amer 4 (*)    GFR calc Af Amer 4 (*)    Anion gap 25 (*)    All other components within normal limits  URINALYSIS, ROUTINE W REFLEX MICROSCOPIC - Abnormal; Notable for the following:    Hgb urine dipstick MODERATE (*)    All  other components within normal limits  HEPATIC FUNCTION PANEL - Abnormal; Notable for the following:    Albumin 2.8 (*)    All other components within normal limits  MAGNESIUM - Abnormal; Notable for the following:    Magnesium 2.9 (*)    All other components within normal limits  COMPREHENSIVE METABOLIC PANEL - Abnormal; Notable for the following:    Sodium 133 (*)    Potassium 6.4 (*)    Chloride 94 (*)    CO2 17 (*)    BUN 130 (*)    Creatinine, Ser 8.44 (*)    Albumin 2.8 (*)    GFR calc non Af Amer 5 (*)    GFR calc Af Amer 6 (*)    Anion gap 22 (*)    All other components within normal limits  PHOSPHORUS - Abnormal; Notable for the following:    Phosphorus 7.1 (*)    All other components within normal limits  I-STAT ARTERIAL BLOOD GAS, ED - Abnormal; Notable for the following:    pH, Arterial 7.318 (*)    pCO2 arterial 22.6 (*)    pO2, Arterial 216.0 (*)    Bicarbonate 11.6 (*)    Acid-base deficit 13.0 (*)    All other components within normal limits  URINE CULTURE  SODIUM, URINE, RANDOM  CREATININE, URINE, RANDOM  URINE MICROSCOPIC-ADD ON  TROPONIN I  TROPONIN I  TROPONIN I  COMPREHENSIVE METABOLIC PANEL  I-STAT TROPOININ, ED  I-STAT CG4 LACTIC ACID, ED    Imaging Review Dg Chest Port 1 View  05/30/2014   CLINICAL DATA:  Shortness of breath, stroke, atrial fibrillation  EXAM: PORTABLE CHEST - 1 VIEW  COMPARISON:  Portable exam 1018 hr compared to 05/21/2014  FINDINGS: Rotated to the RIGHT.  Enlargement of cardiac silhouette with pulmonary vascular congestion.  Mediastinal contours normal.  Bibasilar atelectasis versus infiltrate  No pleural effusion or pneumothorax.  Minimal central peribronchial thickening.  No acute osseous findings.  IMPRESSION: Enlargement of cardiac silhouette with pulmonary vascular congestion.  Minimal bronchitic changes with bibasilar atelectasis versus infiltrate.   Electronically Signed   By: Ulyses Southward M.D.   On: 05/30/2014 10:31      EKG Interpretation   Date/Time:  Wednesday May 30 2014 10:02:26 EDT Ventricular Rate:  83 PR Interval:    QRS Duration: 131 QT Interval:  348 QTC  Calculation: 409 R Axis:   47 Text Interpretation:  Atrial fibrillation Multiple ventricular premature  complexes Nonspecific intraventricular conduction delay Pt hyperK+,  c K+  greter than 7.7 Confirmed by Fayrene Fearing  MD, Maurina Fawaz ([id number]) on 05/30/2014 11:40:18  AM      MDM   Final diagnoses:  AKI (acute kidney injury)  Chronic atrial fibrillation  Dyspnea  Hyperkalemia    Patient with recent hospitalization for hip fracture. Describes sudden shortness of breath this morning. Uncertain of last time he urinated. Evaluation here shows widened QRS without peak T waves and bundle-branch block pattern. Hyperkalemia secondary to acute renal failure  secondary to bladder outlet obstruction. CHF secondary to renal failure secondary to bladder outlet obstruction.  Patient diuresis 800 cc immediately with Foley catheter placement. Given calcium gluconate, bicarbonate, insulin/D50, and Kayexalate.  He improved initially on a BiPAP. Hopefully with diuresis would be able to wean him from BiPAP. EKG shows metabolic acidosis partially compensated with respiratory alkalosis.      Rolland Porter, MD 05/30/14 [phone]

## 2014-05-30 NOTE — Consult Note (Signed)
Patient ID: Erik Arnold MRN: [id number] DOB/AGE: Apr 23, 1924 80 y.o.  Admit date: 05/30/2014 Referring Physician: Mahala Menghini Primary Cardiologist: Mayford Knife Reason for Consultation: Atrial fib with RVR  HPI: 80 yo male with history of persistent atrial fibrillation, CVA and recent hip fracture 05/21/14 who is now readmitted with SOB and found to have atrial fib with rapid ventricular rate and metabolic derangement with K 7.7, creatinine 10.6, WBC 14,000. Heart rate now 90-110. He is on beta blocker therapy and Digoxin at his facility. He was initially on bipap but now breathing comfortably on O2 via nasal cannula. Denies chest pain. Only c/o SOB. Also noted LLE edema.   Past Medical History  Diagnosis Date  . Stroke 08/22/2012    residual left sided weakness (08/23/2012)  . TIA (transient ischemic attack)   . Carpal tunnel syndrome     diagnosed in 1997  . Chronic atrial fibrillation     Not on coumadin as he was deemed a fall risk after he fell in his cardiologists office.    Family History  Problem Relation Age of Onset  . CVA Mother   . Pneumonia Father     History   Social History  . Marital Status: Unknown    Spouse Name: N/A    Number of Children: N/A  . Years of Education: N/A   Occupational History  . Not on file.   Social History Main Topics  . Smoking status: Former Smoker -- 5 years    Types: Pipe  . Smokeless tobacco: Former Neurosurgeon    Types: Chew     Comment: 08/23/2012 "smoked a little pipe in my 20's; haven't chewed in ~ 23month"  . Alcohol Use: No     Comment: 08/23/2012 "last alcohol was a beer in 1980; never had problems w/it"  . Drug Use: No  . Sexual Activity: No   Other Topics Concern  . Not on file   Social History Narrative  . No narrative on file    Past Surgical History  Procedure Laterality Date  . Inguinal hernia repair  ~ 1992    "left" (08/23/2012)  . Cataract extraction w/ intraocular lens  implant, bilateral  ~ 2005  .  Proctosigmoidoscopy      November 2001 normal screening proctocolonoscopy to the cecum   . Intramedullary (im) nail intertrochanteric Left 05/21/2014    Procedure: LEFT INTRAMEDULLARY (IM) NAIL INTERTROCHANTRIC;  Surgeon: Cammy Copa, MD;  Location: [facility] OR;  Service: Orthopedics;  Laterality: Left;    No Known Allergies  Home Medications:  Current outpatient prescriptions: aspirin EC 325 MG EC tablet, Take 1 tablet (325 mg total) by mouth daily with breakfast., Disp: 30 tablet, Rfl: 0;  bisacodyl (DULCOLAX) 5 MG EC tablet, Take 1 tablet (5 mg total) by mouth daily as needed for moderate constipation., Disp: 30 tablet, Rfl: 0;   digoxin (LANOXIN) 0.125 MG tablet, Take 1 tablet (0.125 mg total) by mouth daily., Disp: 30 tablet, Rfl: 11 docusate sodium 100 MG CAPS, Take 100 mg by mouth 2 (two) times daily., Disp: 10 capsule, Rfl: 0;  HYDROcodone-acetaminophen (NORCO/VICODIN) 5-325 MG per tablet, Take 1-2 tablets by mouth every 4 (four) hours as needed for moderate pain., Disp: 60 tablet, Rfl: 0;   iron polysaccharides (NIFEREX) 150 MG capsule, Take 150 mg by mouth daily., Disp: , Rfl:  metoprolol tartrate (LOPRESSOR) 25 MG tablet, Take 0.5 tablets (12.5 mg total) by mouth 2 (two) times daily., Disp: 30 tablet, Rfl: 11;   polyethylene glycol (  MIRALAX / GLYCOLAX) packet, Take 17 g by mouth daily., Disp: 14 each, Rfl: 0;  senna (SENOKOT) 8.6 MG TABS tablet, Take 1 tablet by mouth daily., Disp: , Rfl: ;   warfarin (COUMADIN) 4 MG tablet, Take 4 mg by mouth daily., Disp: , Rfl:  aspirin 81 MG EC tablet, Take 1 tablet (81 mg total) by mouth daily. Resume after 2 weeks, Swallow whole., Disp: 30 tablet, Rfl: 12;   clopidogrel (PLAVIX) 75 MG tablet, Take 1 tablet (75 mg total) by mouth daily.  Disp: 30 tablet, Rfl: 11  Review of systems complete and found to be negative unless listed above    Physical Exam: Blood pressure 92/44, pulse 82, temperature 98.5 F (36.9 C), temperature source Oral, resp.  rate 23, SpO2 99.00%.   General: Elderly male in minimal distress. Dyspneic with talking. Oriented to person and place.  HEENT: OP clear, mucus membranes moist  SKIN: warm, dry. No rashes.  Neuro: No focal deficits  Psychiatric: Mood and affect normal  Neck: No JVD, no carotid bruits, no thyromegaly, no lymphadenopathy.  Lungs:Clear bilaterally, no wheezes, rhonci, crackles  Cardiovascular: Irreg irreg with no loud murmurs. No gallops or rubs.  Abdomen:Soft. Bowel sounds present. Non-tender.  Extremities: 1+ left lower ext edema. Trace right lower ext edema.   Labs:   Lab Results  Component Value Date   WBC 14.7* 05/30/2014   HGB 10.6* 05/30/2014   HCT 30.6* 05/30/2014   MCV 108.5* 05/30/2014   PLT 320 05/30/2014    Recent Labs Lab 05/30/14 1005  NA 128*  K >7.7*  CL 92*  CO2 11*  BUN 140*  CREATININE 10.68*  CALCIUM 8.4  GLUCOSE 110*    Radiology: Chest x-ray:  Enlargement of cardiac silhouette with pulmonary vascular  congestion.  Minimal bronchitic changes with bibasilar atelectasis versus  infiltrate.  EKG: Atrial fib, rate 83 bpm. IVCD  ASSESSMENT AND PLAN:   1. Atrial fibrillation, persistent: Would resume home dose of beta blocker and titrate for rate control. He is on coumadin. INR is 2.8 today. Follow serial EKG as electrolyte abnormalities are corrected. Digoxin on hold with renal insufficiency. We will follow with you and make further recommendations.   2. Acute renal insufficiency: Per primary team. Possible obstructive uropathy. Hematuria after initial clear urine stream per nursing after foley placed.   3. Dyspnea with unilateral LE edema: High risk for PE with recent hip fracture and immobility. Agree with V/Q scan to exclude PE.   4. Hyperkalemia: management plan currently underway per primary team.    Signed: Baker Moronta 05/30/2014, 1:28 PM

## 2014-05-30 NOTE — Consult Note (Signed)
I have seen and examined this patient and agree with the plan of care   [facility] 05/30/2014, 7:09 PM

## 2014-05-30 NOTE — Consult Note (Signed)
Reason for Consult: Acute Renal Failure w/ hyperkalemia Referring Physician: Dr. Nita Sells  HPI: Erik Arnold is an 80 y.o. male w/ PMHx of CVA (2013), chronic atrial fibrillation (on Coumadin), presented from [facility] facility d/t worsening SOB and LE edema. Patient unable to provide clear history, however, he claims he was feeling short of breath at the nursing home and had some worsening L hip and leg pain. Per chart review, the patient was found to be very SOB at [facility], unable to catch his breath, using accessory muscles. Patient was recently discharged on 05/24/14 after he was hospitalized for left trochanteric hip fracture (fall), now s/p ORIF by Dr. Marlou Sa.  On arrival to the ED, patient found to have Cr of 10.68, K of >7.7 and requiring BiPAP initially given his significant SOB. Foley catheter was placed and drained >1L frank hematuria initially. Patient denies any significant abdominal pain, nausea, vomiting, bladder discomfort, or flank pain.    PMH:   Past Medical History  Diagnosis Date  . Stroke 08/22/2012    residual left sided weakness (08/23/2012)  . TIA (transient ischemic attack)   . Carpal tunnel syndrome     diagnosed in 1997  . Chronic atrial fibrillation     Not on coumadin as he was deemed a fall risk after he fell in his cardiologists office.    PSH:   Past Surgical History  Procedure Laterality Date  . Inguinal hernia repair  ~ 1992    "left" (08/23/2012)  . Cataract extraction w/ intraocular lens  implant, bilateral  ~ 2005  . Proctosigmoidoscopy      November 2001 normal screening proctocolonoscopy to the cecum   . Intramedullary (im) nail intertrochanteric Left 05/21/2014    Procedure: LEFT INTRAMEDULLARY (IM) NAIL INTERTROCHANTRIC;  Surgeon: Meredith Pel, MD;  Location: [facility];  Service: Orthopedics;  Laterality: Left;    Allergies: No Known Allergies  Medications:   Prior to Admission medications   Medication Sig Start Date End Date  Taking? Authorizing Provider  aspirin EC 325 MG EC tablet Take 1 tablet (325 mg total) by mouth daily with breakfast. 05/22/14  Yes Meredith Pel, MD  bisacodyl (DULCOLAX) 5 MG EC tablet Take 1 tablet (5 mg total) by mouth daily as needed for moderate constipation. 05/24/14  Yes Ripudeep Krystal Eaton, MD  digoxin (LANOXIN) 0.125 MG tablet Take 1 tablet (0.125 mg total) by mouth daily. 05/17/14  Yes Sueanne Margarita, MD  docusate sodium 100 MG CAPS Take 100 mg by mouth 2 (two) times daily. 05/24/14  Yes Ripudeep Krystal Eaton, MD  HYDROcodone-acetaminophen (NORCO/VICODIN) 5-325 MG per tablet Take 1-2 tablets by mouth every 4 (four) hours as needed for moderate pain. 05/22/14  Yes Meredith Pel, MD  iron polysaccharides (NIFEREX) 150 MG capsule Take 150 mg by mouth daily.   Yes Historical Provider, MD  metoprolol tartrate (LOPRESSOR) 25 MG tablet Take 0.5 tablets (12.5 mg total) by mouth 2 (two) times daily. 05/17/14  Yes Sueanne Margarita, MD  polyethylene glycol (MIRALAX / GLYCOLAX) packet Take 17 g by mouth daily. 05/24/14  Yes Ripudeep Krystal Eaton, MD  senna (SENOKOT) 8.6 MG TABS tablet Take 1 tablet by mouth daily.   Yes Historical Provider, MD  warfarin (COUMADIN) 4 MG tablet Take 4 mg by mouth daily.   Yes Historical Provider, MD  aspirin 81 MG EC tablet Take 1 tablet (81 mg total) by mouth daily. Resume after 2 weeks, Swallow whole. 05/24/14   Ripudeep Krystal Eaton,  MD  clopidogrel (PLAVIX) 75 MG tablet Take 1 tablet (75 mg total) by mouth daily. Resume once on ASA 82m 05/24/14   Ripudeep KKrystal Eaton MD    Discontinued Meds:   Medications Discontinued During This Encounter  Medication Reason  . diltiazem (CARDIZEM) tablet 30 mg   . sodium polystyrene (KAYEXALATE) 15 GM/60ML suspension 30 g      Family History:   Family History  Problem Relation Age of Onset  . CVA Mother   . Pneumonia Father     Social History:  reports that he has quit smoking. His smoking use included Pipe. He has quit using smokeless tobacco. His  smokeless tobacco use included Chew. He reports that he does not drink alcohol or use illicit drugs.  Review of Systems  Constitutional: Negative for fever, chills and malaise/fatigue.  Respiratory: Positive for shortness of breath. Negative for cough and wheezing.   Cardiovascular: Positive for leg swelling. Negative for chest pain and palpitations.  Gastrointestinal: Negative for nausea, vomiting, abdominal pain, diarrhea and constipation.  Musculoskeletal: Positive for falls and joint pain (left hip).  Neurological: Negative for weakness.    Blood pressure 92/44, pulse 82, temperature 98.5 F (36.9 C), temperature source Oral, resp. rate 23, SpO2 99.00%.  Physical Exam  General: Elderly male, alert, cooperative, NAD. Multiple moles/skin tags. Somewhat restless on exam.  HEENT: PERRL, EOMI. Moist mucus membranes Neck: Full range of motion without pain, supple, no lymphadenopathy or carotid bruits Lungs: Clear to ascultation bilaterally, normal work of respiration, no wheezes, rales, rhonchi Heart: Tachycardic, irregular rhythm, no murmurs, gallops, or rubs Abdomen: Soft, non-tender, non-distended, BS + Extremities: +3 LE edema extending up to hip, L>R. Left hip w/ healing surgical incision. Appears clean, dry, intact.  Neurologic: Alert & oriented x2 (not oriented to time), cranial nerves II-XII intact, strength grossly intact, sensation intact to light touch    Creatinine, Ser  Date/Time Value Ref Range Status  05/30/2014 10:05 AM 10.68* 0.50 - 1.35 mg/dL Final  05/23/2014  6:30 AM 1.06  0.50 - 1.35 mg/dL Final  05/22/2014  6:00 AM 0.73  0.50 - 1.35 mg/dL Final  05/21/2014 10:35 AM 0.72  0.50 - 1.35 mg/dL Final  08/25/2012  4:24 AM 0.70  0.50 - 1.35 mg/dL Final  08/23/2012 12:22 AM 0.86  0.50 - 1.35 mg/dL Final  08/22/2012  9:04 PM 0.80  0.50 - 1.35 mg/dL Final    Results for orders placed during the hospital encounter of 05/30/14 (from the past 48 hour(s))  PRO B NATRIURETIC  PEPTIDE     Status: Abnormal   Collection Time    05/30/14 10:05 AM      Result Value Ref Range   Pro B Natriuretic peptide (BNP) 3632.0 (*) 0 - 450 pg/mL  PROTIME-INR     Status: Abnormal   Collection Time    05/30/14 10:05 AM      Result Value Ref Range   Prothrombin Time 29.5 (*) 11.6 - 15.2 seconds   INR 2.80 (*) 0.00 - 1.49  CBC WITH DIFFERENTIAL     Status: Abnormal   Collection Time    05/30/14 10:05 AM      Result Value Ref Range   WBC 14.7 (*) 4.0 - 10.5 K/uL   RBC 2.82 (*) 4.22 - 5.81 MIL/uL   Hemoglobin 10.6 (*) 13.0 - 17.0 g/dL   HCT 30.6 (*) 39.0 - 52.0 %   MCV 108.5 (*) 78.0 - 100.0 fL   MCH 37.6 (*) 26.0 - 34.0  pg   MCHC 34.6  30.0 - 36.0 g/dL   RDW 14.8  11.5 - 15.5 %   Platelets 320  150 - 400 K/uL   Neutrophils Relative % 71  43 - 77 %   Neutro Abs 10.6 (*) 1.7 - 7.7 K/uL   Lymphocytes Relative 11 (*) 12 - 46 %   Lymphs Abs 1.6  0.7 - 4.0 K/uL   Monocytes Relative 15 (*) 3 - 12 %   Monocytes Absolute 2.2 (*) 0.1 - 1.0 K/uL   Eosinophils Relative 2  0 - 5 %   Eosinophils Absolute 0.3  0.0 - 0.7 K/uL   Basophils Relative 1  0 - 1 %   Basophils Absolute 0.1  0.0 - 0.1 K/uL  BASIC METABOLIC PANEL     Status: Abnormal   Collection Time    05/30/14 10:05 AM      Result Value Ref Range   Sodium 128 (*) 137 - 147 mEq/L   Potassium >7.7 (*) 3.7 - 5.3 mEq/L   Comment: NO VISIBLE HEMOLYSIS     REPEATED TO VERIFY     CRITICAL RESULT CALLED TO, READ BACK BY AND VERIFIED WITH:     M.BROWN,RN [id number] 05/30/14 CLARK,S   Chloride 92 (*) 96 - 112 mEq/L   CO2 11 (*) 19 - 32 mEq/L   Glucose, Bld 110 (*) 70 - 99 mg/dL   BUN 140 (*) 6 - 23 mg/dL   Creatinine, Ser 10.68 (*) 0.50 - 1.35 mg/dL   Calcium 8.4  8.4 - 10.5 mg/dL   GFR calc non Af Amer 4 (*) >90 mL/min   GFR calc Af Amer 4 (*) >90 mL/min   Comment: (NOTE)     The eGFR has been calculated using the CKD EPI equation.     This calculation has not been validated in all clinical situations.     eGFR's persistently  <90 mL/min signify possible Chronic Kidney     Disease.   Anion gap 25 (*) 5 - 15  I-STAT TROPOININ, ED     Status: None   Collection Time    05/30/14 10:20 AM      Result Value Ref Range   Troponin i, poc 0.01  0.00 - 0.08 ng/mL   Comment 3            Comment: Due to the release kinetics of cTnI,     a negative result within the first hours     of the onset of symptoms does not rule out     myocardial infarction with certainty.     If myocardial infarction is still suspected,     repeat the test at appropriate intervals.  I-STAT CG4 LACTIC ACID, ED     Status: None   Collection Time    05/30/14 10:22 AM      Result Value Ref Range   Lactic Acid, Venous 1.09  0.5 - 2.2 mmol/L  I-STAT ARTERIAL BLOOD GAS, ED     Status: Abnormal   Collection Time    05/30/14 10:38 AM      Result Value Ref Range   pH, Arterial 7.318 (*) 7.350 - 7.450   pCO2 arterial 22.6 (*) 35.0 - 45.0 mmHg   pO2, Arterial 216.0 (*) 80.0 - 100.0 mmHg   Bicarbonate 11.6 (*) 20.0 - 24.0 mEq/L   TCO2 12  0 - 100 mmol/L   O2 Saturation 100.0     Acid-base deficit 13.0 (*) 0.0 - 2.0 mmol/L  Collection site RADIAL, ALLEN'S TEST ACCEPTABLE     Drawn by RT     Sample type ARTERIAL    URINALYSIS, ROUTINE W REFLEX MICROSCOPIC     Status: Abnormal   Collection Time    05/30/14 11:34 AM      Result Value Ref Range   Color, Urine YELLOW  YELLOW   APPearance CLEAR  CLEAR   Specific Gravity, Urine 1.008  1.005 - 1.030   pH 5.5  5.0 - 8.0   Glucose, UA NEGATIVE  NEGATIVE mg/dL   Hgb urine dipstick MODERATE (*) NEGATIVE   Bilirubin Urine NEGATIVE  NEGATIVE   Ketones, ur NEGATIVE  NEGATIVE mg/dL   Protein, ur NEGATIVE  NEGATIVE mg/dL   Urobilinogen, UA 0.2  0.0 - 1.0 mg/dL   Nitrite NEGATIVE  NEGATIVE   Leukocytes, UA NEGATIVE  NEGATIVE  URINE MICROSCOPIC-ADD ON     Status: None   Collection Time    05/30/14 11:34 AM      Result Value Ref Range   RBC / HPF 0-2  <3 RBC/hpf   Urine-Other AMORPHOUS URATES/PHOSPHATES       Dg Chest Port 1 View  05/30/2014   CLINICAL DATA:  Shortness of breath, stroke, atrial fibrillation  EXAM: PORTABLE CHEST - 1 VIEW  COMPARISON:  Portable exam 1018 hr compared to 05/21/2014  FINDINGS: Rotated to the RIGHT.  Enlargement of cardiac silhouette with pulmonary vascular congestion.  Mediastinal contours normal.  Bibasilar atelectasis versus infiltrate  No pleural effusion or pneumothorax.  Minimal central peribronchial thickening.  No acute osseous findings.  IMPRESSION: Enlargement of cardiac silhouette with pulmonary vascular congestion.  Minimal bronchitic changes with bibasilar atelectasis versus infiltrate.   Electronically Signed   By: Lavonia Dana M.D.   On: 05/30/2014 10:31     Assessment/Plan: 80 y/o M w/ recent hip fracture, atrial fibrillation, and h/o CVA in 2013, admitted w/ SOb, found to have acute renal failure and hyperkalemia 2/2 urinary obstruction.   Acute Renal Failure w/ outflow obstruction- Patient admitted w/ Cr 10.68, 2/2 urinary obstruction possibly in the setting of pain medication use s/p ORIF of the left hip? Baseline Cr ~1.0. ~3L urine output since foley placed, draining frank hematuria. This is also somewhat concerning for bladder mass resulting in obstruction. Given Lasix 80 mg IV once. Continue to follow renal function. NaHCO3 @ 100 cc/hr.  Hyperkalemia- K 7.7 on admission, now s/p Kayexalate 15 g, Insulin/D50, and 1 g calcium gluconate IV. EKG w/ no peaked T-waves, QRS duration 131 ms. Repeat labs in afternoon.  Anion Gap Metabolic Acidosis- HCO3 11, AG 25, 2/2 acute renal failure. ABG also w/ low pCO2, tachypneic on exam, likely compensatory respiratory alkalosis. Given 1 amp HCO3 in ED, now receiving isotonic NaHCO3 in H2O @ 100 cc/hr.  Atrial Fibrillation w/ RVR- Restarted BB, on Coumadin, INR 2.8. Troponins pending. Cardiology following.  SOB- Unclear etiology, pulmonary edema on CXR. Question PE in the setting of recent ORIF, however, INR  therapeutic. For V/Q scan.   Luanne Bras 05/30/2014, 1:13 PM  PGY-2, Internal Medicine Pager: [phone]

## 2014-05-30 NOTE — ED Notes (Signed)
Patient transported to MRI 

## 2014-05-30 NOTE — ED Notes (Signed)
EDP at the bedside.  ?

## 2014-05-30 NOTE — H&P (Signed)
[facility] History and Physical  Kree Rafter QZE:[id number] DOB: Nov 15, 1923 DOA: 05/30/2014  Referring physician: ED PCP: Sueanne Margarita, MD  Specialists: CCM, Cardiology  Chief Complaint: Multiple  HPI: 80 y/o ?, h/o recent admit 9/21-9/24/15 for L hip #,  Afib CHad2Vasc2 score~4 not on coumadin 2/2 to falls, prior TIA 07/2012, re-presented from [facility] c ? wob/sob and LE edema.  He is a fair historian and cannot actually tell me why he is here.  He was placed on [place] because on arrival all accessory muscles of respiration were in use.   On direct questioning his main c/o is his LLE pain. HE doesn;t know when he last urinated.  Uses diapers usually.   No CP, blurred vision, double vision No fever no chills no cough no cold Discussed with his nurse, Kaitlin at [facility]-PT apparently got him up and patient got very SOB.  SATs 98% on 2 L . BO 122/60, HR 120.  He wasn't able to catch breath.  Was using accessory muscles to breath.   He apparently has been having this SOB since being at the facility INR was greater than 8 on 9/28 and  On 9/29 was 4.0 so his plavix and asa had been held   Baseline creat 17/0.73 Labs in ED sodium 120 potassium above 7.7 chloride 92 CO2 11 BUN 140/creatinine 10.6 glucose 110 Anion gap 25 BNP 3632 lactic acid 1.09 ABG 7.31/22.6/216 Chest x-ray = enlargement cardiac silhouette and pulmonary vascular congestion minimal bronchitic changes EKG A. fib with widened QRS incomplete right bundle branch he T waves  Review of Systems:   as above  Past Medical History  Diagnosis Date  . Stroke 08/22/2012    residual left sided weakness (08/23/2012)  . TIA (transient ischemic attack)   . Carpal tunnel syndrome     diagnosed in 1997  . Chronic atrial fibrillation     Not on coumadin as he was deemed a fall risk after he fell in his cardiologists office.   Past Surgical History  Procedure Laterality Date  . Inguinal hernia repair  ~ 1992    "left"  (08/23/2012)  . Cataract extraction w/ intraocular lens  implant, bilateral  ~ 2005  . Proctosigmoidoscopy      November 2001 normal screening proctocolonoscopy to the cecum   . Intramedullary (im) nail intertrochanteric Left 05/21/2014    Procedure: LEFT INTRAMEDULLARY (IM) NAIL INTERTROCHANTRIC;  Surgeon: Meredith Pel, MD;  Location: [facility];  Service: Orthopedics;  Laterality: Left;   Social History:  History   Social History Narrative  . No narrative on file    No Known Allergies  Family History  Problem Relation Age of Onset  . CVA Mother   . Pneumonia Father     Prior to Admission medications   Medication Sig Start Date End Date Taking? Authorizing Provider  aspirin EC 325 MG EC tablet Take 1 tablet (325 mg total) by mouth daily with breakfast. 05/22/14  Yes Meredith Pel, MD  bisacodyl (DULCOLAX) 5 MG EC tablet Take 1 tablet (5 mg total) by mouth daily as needed for moderate constipation. 05/24/14  Yes Ripudeep Krystal Eaton, MD  digoxin (LANOXIN) 0.125 MG tablet Take 1 tablet (0.125 mg total) by mouth daily. 05/17/14  Yes Sueanne Margarita, MD  docusate sodium 100 MG CAPS Take 100 mg by mouth 2 (two) times daily. 05/24/14  Yes Ripudeep Krystal Eaton, MD  HYDROcodone-acetaminophen (NORCO/VICODIN) 5-325 MG per tablet Take 1-2 tablets by mouth every 4 (four) hours  as needed for moderate pain. 05/22/14  Yes Meredith Pel, MD  iron polysaccharides (NIFEREX) 150 MG capsule Take 150 mg by mouth daily.   Yes Historical Provider, MD  metoprolol tartrate (LOPRESSOR) 25 MG tablet Take 0.5 tablets (12.5 mg total) by mouth 2 (two) times daily. 05/17/14  Yes Sueanne Margarita, MD  polyethylene glycol (MIRALAX / GLYCOLAX) packet Take 17 g by mouth daily. 05/24/14  Yes Ripudeep Krystal Eaton, MD  senna (SENOKOT) 8.6 MG TABS tablet Take 1 tablet by mouth daily.   Yes Historical Provider, MD  warfarin (COUMADIN) 4 MG tablet Take 4 mg by mouth daily.   Yes Historical Provider, MD  aspirin 81 MG EC tablet Take 1  tablet (81 mg total) by mouth daily. Resume after 2 weeks, Swallow whole. 05/24/14   Ripudeep Krystal Eaton, MD  clopidogrel (PLAVIX) 75 MG tablet Take 1 tablet (75 mg total) by mouth daily. Resume once on ASA 24m 05/24/14   Ripudeep KKrystal Eaton MD   Physical Exam: Filed Vitals:   05/30/14 1000 05/30/14 1050 05/30/14 1100  BP: 130/100  117/64  Pulse: 81 93 94  Temp: 98.5 F (36.9 C)    TempSrc: Oral    Resp: 25 23 21   SpO2: 96% 96% 99%     General:  Alert but some mild confusion  Eyes: EOMI NCAT mild pallor  ENT: Throat clear soft supple, possible JVD  Neck: No bruits  Cardiovascular: S1-S2 tachycardic 100 range no murmur  Respiratory: Clinically clear no added sound   Abdomen: Soft no rebound or guarding  Skin: Minimal lower extremity edema  Musculoskeletal: Range of motion intact bilaterally throughout  Psychiatric: Euthymic anxious  Neurologic: Power 5/5  Labs on Admission:  Basic Metabolic Panel:  Recent Labs Lab 05/30/14 1005  NA 128*  K >7.7*  CL 92*  CO2 11*  GLUCOSE 110*  BUN 140*  CREATININE 10.68*  CALCIUM 8.4   Liver Function Tests: No results found for this basename: AST, ALT, ALKPHOS, BILITOT, PROT, ALBUMIN,  in the last 168 hours No results found for this basename: LIPASE, AMYLASE,  in the last 168 hours No results found for this basename: AMMONIA,  in the last 168 hours CBC:  Recent Labs Lab 05/24/14 0630 05/30/14 1005  WBC 8.3 14.7*  NEUTROABS  --  10.6*  HGB 9.1* 10.6*  HCT 27.0* 30.6*  MCV 108.4* 108.5*  PLT 164 320   Cardiac Enzymes: No results found for this basename: CKTOTAL, CKMB, CKMBINDEX, TROPONINI,  in the last 168 hours  BNP (last 3 results)  Recent Labs  05/30/14 1005  PROBNP 3632.0*   CBG: No results found for this basename: GLUCAP,  in the last 168 hours  Radiological Exams on Admission: Dg Chest Port 1 View  05/30/2014   CLINICAL DATA:  Shortness of breath, stroke, atrial fibrillation  EXAM: PORTABLE CHEST - 1 VIEW   COMPARISON:  Portable exam 1018 hr compared to 05/21/2014  FINDINGS: Rotated to the RIGHT.  Enlargement of cardiac silhouette with pulmonary vascular congestion.  Mediastinal contours normal.  Bibasilar atelectasis versus infiltrate  No pleural effusion or pneumothorax.  Minimal central peribronchial thickening.  No acute osseous findings.  IMPRESSION: Enlargement of cardiac silhouette with pulmonary vascular congestion.  Minimal bronchitic changes with bibasilar atelectasis versus infiltrate.   Electronically Signed   By: MLavonia DanaM.D.   On: 05/30/2014 10:31    EKG: Independently reviewed. Reviewed as above  Assessment/Plan    Acute hypoxic respiratory failure-unclear etiology. Could  be secondary to pulmonary edema. Have low suspicion for this being a pulmonary embolus however in view of his  presentation and recent hip surgery and him being placed on aspirin and Plavix on discharge feel it is prudent to get a stat VQ scan as he cannot have a CT scan because of his kidney dysfunction. His ABG showed met acidosis acidosis.    Acute bilateral obstructive uropathy-she had Foley placed and passed 1 L of dark colored urine. Likely he has obstructive uropathy causing his acute kidney injury. We will keep Foley in place, give Lasix 40 IV daily. I have already consulted nephrology. I will hold off on getting a renal ultrasound at this time.  Get urine sodium, urine creatinine.      Chronic atrial fibrillation with rapid ventricular response-am not sure if he's taken his medications today. He was given his Toprol 12.5 twice a day. Digitoxin 0.125 will have to be either dosed by pharmacy or held because of his renal dysfunction. I've asked cardiology to weigh in on his management.  We'll obtain a magnesium as well.    New right bundle branch-unclear etiology. Cycle cardiac enzymes S. history is unreliable, first troponin 0.01. If he does not complain of overt chest pain we will cycle enzymes and potentially  get an echocardiogram depending on cardiology input     Possible new onset heart failure-patient has an elevated BNP 3632  in the setting of renal dysfunction but also has cardiomegaly and venous congestion On chest x-ray. He may have developed new onset heart failure from either rapid ventricular response or urinary retention. We will monitor. He was continued diuretic as above carefully. Repeat EKG in a.m.. Consider echocardiogram    Transaminitis-unclear etiology. INR was 8 on 9/24 and is currently still above therapeutic range.  I have added LFTs to his labs and will reassess him in the morning.  It appears that was on warfarin 4 mg from the nursing home records. This will be held    TIA (transient ischemic attack)-currently holding aspirin Plavix    Hyperkalemia-likely 2/2 to number 2.  This should resolve he he's been given appropriately Kayexalate, insulin, calcium gluconate x2.  He will receive Lasix 40 milligrams daily IV to help with Kaliuresis  Hypovolemic Hyponatremia-this should resolve with use of Lasix and catheter. Repeat labs every 6 hours    Metabolic VANVBTYO-MA=00.   =13.  This indicates a mixed Metabolic acidosis and probably reps alkalosis.   has been started on sodium bicarbonate 100 cc an hour  Leukocytosis-unclear etiology and probably reactive as has no fever. Monitor labs in a.m..  Impaired glucose tolerance-monitor blood sugars every morning     CRITICAL CARE Performed by: Nita Sells   Total critical care time: 75  Critical care time was exclusive of separately billable procedures and treating other patients.  Critical care was necessary to treat or prevent imminent or life-threatening deterioration.  Critical care was time spent personally by me on the following activities: development of treatment plan with patient and/or surrogate as well as nursing, discussions with consultants, evaluation of patient's response to treatment, examination of  patient, obtaining history from patient or surrogate, ordering and performing treatments and interventions, ordering and review of laboratory studies, ordering and review of radiographic studies, pulse oximetry and re-evaluation of patient's condition.   Time spent: [place], [facility] Pager [phone]  If 7PM-7AM, please contact night-coverage [email or web address] Password [email or web address] 05/30/2014, 12:28 PM

## 2014-05-30 NOTE — ED Notes (Signed)
Pt to department via EMS- pt reports that he had left hip surgery about a week ago and at [facility] for rehab. States that he began feeling sob this morning. Pt given 10mg  albuterol and 0.5mg  atrovent. Pt sats were 98% but wheezing noted throughout. Bp-145/86

## 2014-05-30 NOTE — Progress Notes (Signed)
Pt arrived to unit accompanied by ED RN, pt oriented to room/unit. No s/s of acute distress noted.

## 2014-05-30 NOTE — ED Notes (Signed)
Admitting MD at the bedside.  

## 2014-05-30 NOTE — ED Notes (Signed)
RT called for Bi pap

## 2014-05-31 DIAGNOSIS — I5031 Acute diastolic (congestive) heart failure: Secondary | ICD-10-CM

## 2014-05-31 DIAGNOSIS — N179 Acute kidney failure, unspecified: Principal | ICD-10-CM

## 2014-05-31 DIAGNOSIS — I5030 Unspecified diastolic (congestive) heart failure: Secondary | ICD-10-CM | POA: Diagnosis present

## 2014-05-31 DIAGNOSIS — I482 Chronic atrial fibrillation: Secondary | ICD-10-CM

## 2014-05-31 LAB — COMPREHENSIVE METABOLIC PANEL
ALK PHOS: 73 U/L (ref 39–117)
ALT: 10 U/L (ref 0–53)
ANION GAP: 14 (ref 5–15)
AST: 16 U/L (ref 0–37)
Albumin: 2.6 g/dL — ABNORMAL LOW (ref 3.5–5.2)
BILIRUBIN TOTAL: 0.7 mg/dL (ref 0.3–1.2)
BUN: 78 mg/dL — ABNORMAL HIGH (ref 6–23)
CHLORIDE: 100 meq/L (ref 96–112)
CO2: 26 mEq/L (ref 19–32)
Calcium: 8.8 mg/dL (ref 8.4–10.5)
Creatinine, Ser: 3.12 mg/dL — ABNORMAL HIGH (ref 0.50–1.35)
GFR calc non Af Amer: 16 mL/min — ABNORMAL LOW (ref >90)
GFR, EST AFRICAN AMERICAN: 19 mL/min — AB (ref >90)
Glucose, Bld: 91 mg/dL (ref 70–99)
POTASSIUM: 5.2 meq/L (ref 3.7–5.3)
SODIUM: 140 meq/L (ref 137–147)
Total Protein: 5.8 g/dL — ABNORMAL LOW (ref 6.0–8.3)

## 2014-05-31 LAB — CBC WITH DIFFERENTIAL/PLATELET
BASOS ABS: 0.1 10*3/uL (ref 0.0–0.1)
Basophils Relative: 1 % (ref 0–1)
Eosinophils Absolute: 0.2 10*3/uL (ref 0.0–0.7)
Eosinophils Relative: 2 % (ref 0–5)
HCT: 27 % — ABNORMAL LOW (ref 39.0–52.0)
HEMOGLOBIN: 9.3 g/dL — AB (ref 13.0–17.0)
LYMPHS PCT: 17 % (ref 12–46)
Lymphs Abs: 1.4 10*3/uL (ref 0.7–4.0)
MCH: 36.3 pg — ABNORMAL HIGH (ref 26.0–34.0)
MCHC: 34.4 g/dL (ref 30.0–36.0)
MCV: 105.5 fL — ABNORMAL HIGH (ref 78.0–100.0)
MONOS PCT: 7 % (ref 3–12)
Monocytes Absolute: 0.6 10*3/uL (ref 0.1–1.0)
NEUTROS ABS: 6 10*3/uL (ref 1.7–7.7)
Neutrophils Relative %: 73 % (ref 43–77)
Platelets: 324 10*3/uL (ref 150–400)
RBC: 2.56 MIL/uL — ABNORMAL LOW (ref 4.22–5.81)
RDW: 14.6 % (ref 11.5–15.5)
WBC: 8.2 10*3/uL (ref 4.0–10.5)

## 2014-05-31 LAB — GLUCOSE, CAPILLARY: Glucose-Capillary: 107 mg/dL — ABNORMAL HIGH (ref 70–99)

## 2014-05-31 LAB — URINE CULTURE
CULTURE: NO GROWTH
Colony Count: NO GROWTH

## 2014-05-31 LAB — TROPONIN I: Troponin I: <0.3 ng/mL (ref <0.30)

## 2014-05-31 LAB — PROTIME-INR
INR: 2.63 — AB (ref 0.00–1.49)
Prothrombin Time: 28.1 seconds — ABNORMAL HIGH (ref 11.6–15.2)

## 2014-05-31 LAB — MAGNESIUM: MAGNESIUM: 2.4 mg/dL (ref 1.5–2.5)

## 2014-05-31 MED ORDER — WARFARIN SODIUM 4 MG PO TABS
4.0000 mg | ORAL_TABLET | Freq: Every day | ORAL | Status: DC
  Administered 2014-05-31 – 2014-06-01 (×2): 4 mg via ORAL
  Filled 2014-05-31 (×3): qty 1

## 2014-05-31 MED ORDER — SODIUM CHLORIDE 0.9 % IV SOLN
INTRAVENOUS | Status: DC
  Administered 2014-05-31 – 2014-06-01 (×3): via INTRAVENOUS
  Administered 2014-06-01: 75 mL via INTRAVENOUS
  Administered 2014-06-02: 11:00:00 via INTRAVENOUS

## 2014-05-31 MED ORDER — TAMSULOSIN HCL 0.4 MG PO CAPS
0.4000 mg | ORAL_CAPSULE | Freq: Every day | ORAL | Status: DC
  Administered 2014-05-31 – 2014-06-04 (×5): 0.4 mg via ORAL
  Filled 2014-05-31 (×6): qty 1

## 2014-05-31 MED ORDER — WARFARIN - PHARMACIST DOSING INPATIENT
Freq: Every day | Status: DC
  Administered 2014-05-31 – 2014-06-01 (×2)

## 2014-05-31 NOTE — Progress Notes (Signed)
Pharmacist Heart Failure Core Measure Documentation  Assessment: Erik Arnold has an EF documented as  on   by . HF stated in MD note.  Rationale: Heart failure patients with left ventricular systolic dysfunction (LVSD) and an EF < 40% should be prescribed an angiotensin converting enzyme inhibitor (ACEI) or angiotensin receptor blocker (ARB) at discharge unless a contraindication is documented in the medical record.  This patient is not currently on an ACEI or ARB for HF.  This note is being placed in the record in order to provide documentation that a contraindication to the use of these agents is present for this encounter.  ACE Inhibitor or Angiotensin Receptor Blocker is contraindicated (specify all that apply)  []   ACEI allergy AND ARB allergy []   Angioedema []   Moderate or severe aortic stenosis []   Hyperkalemia []   Hypotension []   Renal artery stenosis [x]   Worsening renal function, preexisting renal disease or dysfunction  Erik Arnold S. Merilynn Finland, PharmD, BCPS Clinical Staff Pharmacist Pager [phone]   [name] 05/31/2014 2:40 PM

## 2014-05-31 NOTE — Progress Notes (Signed)
SUBJECTIVE:  No complaints  OBJECTIVE:   Vitals:   Filed Vitals:   05/31/14 0400 05/31/14 0500 05/31/14 0600 05/31/14 0700  BP: 111/51 95/44 109/54 110/45  Pulse: 90 90 72 84  Temp:    98 F (36.7 C)  TempSrc:    Oral  Resp: 16 15 25 19   Height:      Weight:      SpO2: 100% 100% 100% 100%   I&O's:   Intake/Output Summary (Last 24 hours) at 05/31/14 1125 Last data filed at 05/31/14 1100  Gross per 24 hour  Intake   2285 ml  Output   8900 ml  Net  -6615 ml   TELEMETRY: Reviewed telemetry pt in atrial fibrillation with CVR:     PHYSICAL EXAM General: Well developed, well nourished, in no acute distress Head: Eyes PERRLA, No xanthomas.   Normal cephalic and atramatic  Lungs:   Few scattered wheezes Heart:   Irregularly irregular S1 S2 Pulses are 2+ & equal Abdomen: Bowel sounds are positive, abdomen soft and non-tender without masses Extremities:   No clubbing, cyanosis or edema.  DP +1 Neuro: Alert and oriented X 3. Psych:  Good affect, responds appropriately   LABS: Basic Metabolic Panel:  Recent Labs  69/67/89 1317 05/30/14 1329 05/30/14 1900 05/30/14 2116 05/30/14 2340 05/31/14 0401  NA  --  133* 134* 139  --  140  K  --  6.4* 5.5* 5.9*  --  5.2  CL  --  94* 95* 98  --  100  CO2  --  17* 18* 22  --  26  GLUCOSE  --  81 87 97  --  91  BUN  --  130* 111* 105*  --  78*  CREATININE  --  8.44* 6.11* 5.47*  --  3.12*  CALCIUM  --  9.5 9.0 9.0  --  8.8  MG 2.9*  --   --   --  2.4  --   PHOS  --  7.1* 5.9*  --   --   --    Liver Function Tests:  Recent Labs  05/30/14 2116 05/31/14 0401  AST 17 16  ALT 10 10  ALKPHOS 78 73  BILITOT 0.5 0.7  PROT 6.2 5.8*  ALBUMIN 2.6* 2.6*   No results found for this basename: LIPASE, AMYLASE,  in the last 72 hours CBC:  Recent Labs  05/30/14 1005 05/31/14 0401  WBC 14.7* 8.2  NEUTROABS 10.6* 6.0  HGB 10.6* 9.3*  HCT 30.6* 27.0*  MCV 108.5* 105.5*  PLT 320 324   Cardiac Enzymes:  Recent Labs  05/30/14 1317 05/30/14 1917 05/31/14 0141  TROPONINI <0.30 <0.30 <0.30   BNP: No components found with this basename: POCBNP,  D-Dimer: No results found for this basename: DDIMER,  in the last 72 hours Hemoglobin A1C: No results found for this basename: HGBA1C,  in the last 72 hours Fasting Lipid Panel: No results found for this basename: CHOL, HDL, LDLCALC, TRIG, CHOLHDL, LDLDIRECT,  in the last 72 hours Thyroid Function Tests: No results found for this basename: TSH, T4TOTAL, FREET3, T3FREE, THYROIDAB,  in the last 72 hours Anemia Panel: No results found for this basename: VITAMINB12, FOLATE, FERRITIN, TIBC, IRON, RETICCTPCT,  in the last 72 hours Coag Panel:   Lab Results  Component Value Date   INR 2.63* 05/31/2014   INR 2.80* 05/30/2014   INR 1.20 05/24/2014    RADIOLOGY: Dg Chest 1 View  05/21/2014   CLINICAL  DATA:  Left hip fracture following a fall.  EXAM: CHEST - 1 VIEW  COMPARISON:  None.  FINDINGS: Borderline enlarged cardiac silhouette. Tortuous aorta. Mildly prominent interstitial markings with minimal somewhat patchy density at the left lateral lung base. Old, healed left inferior rib fractures laterally. No acute fracture or pneumothorax. Thoracic spine and cervical spine degenerative changes.  IMPRESSION: Borderline cardiomegaly, mild chronic interstitial lung disease and small amount atelectasis or scarring at the left lung base.   Electronically Signed   By: Gordan PaymentSteve  Reid M.D.   On: 05/21/2014 12:01   Dg Hip Complete Left  05/21/2014   CLINICAL DATA:  Fall with left hip fracture.  EXAM: LEFT HIP - COMPLETE 2+ VIEW  COMPARISON:  None.  FINDINGS: There is an intertrochanteric fracture of the left femur with varus angulation of the fracture fragments. No additional evidence of an acute fracture. Bones appear demineralized. Degenerative changes are seen in the lumbosacral spine.  IMPRESSION: 1. Left intertrochanteric femur fracture. 2. Osteopenia. 3. Lumbar spondylosis.    Electronically Signed   By: Leanna BattlesMelinda  Blietz M.D.   On: 05/21/2014 11:58   Dg Femur Left  05/22/2014   CLINICAL DATA:  Left hip ORIF  EXAM: DG C-ARM 61-120 MIN; LEFT FEMUR - 2 VIEW  : COMPARISON:  05/21/2014  FINDINGS: Status post ORIF of an intertrochanteric left femur fracture. Fracture alignment is excellent. No evidence of hardware complication, including the periprosthetic fracture.  IMPRESSION: Intertrochanteric left femur fracture status post ORIF. No adverse findings.   Electronically Signed   By: Tiburcio PeaJonathan  Watts M.D.   On: 05/22/2014 02:36   Dg Femur Left  05/21/2014   CLINICAL DATA:  Fall with left hip fracture.  EXAM: LEFT FEMUR - 2 VIEW  COMPARISON:  None.  FINDINGS: There is an intertrochanteric fracture of the left femur with varus angulation. No dislocation. Bones appear demineralized. Left obturator ring appears grossly intact. Incidental imaging of the left knee reveals mild patellofemoral osteophytosis.  IMPRESSION: Left intertrochanteric femur fracture.  Osteopenia.   Electronically Signed   By: Leanna BattlesMelinda  Blietz M.D.   On: 05/21/2014 11:57   Dg Pelvis Portable  05/22/2014   CLINICAL DATA:  Postop.  EXAM: PORTABLE PELVIS 1-2 VIEWS  COMPARISON:  05/21/2014.  FINDINGS: Status post ORIF of a left femur intertrochanteric fracture. Mildly impacted appearance of the medial fracture compared with intraoperative fluoroscopy is likely related to rotation of the left hip, although there could of also been some mild settling. Dynamic hip screw and intra medullary nail have an unremarkable appearance. The hip is located. No acute bony pelvis findings.  IMPRESSION: Intertrochanteric left femur fracture status post ORIF. No adverse findings.   Electronically Signed   By: Tiburcio PeaJonathan  Watts M.D.   On: 05/22/2014 03:00   Nm Pulmonary Perf And Vent  05/30/2014   CLINICAL DATA:  Shortness of Breath  EXAM: NUCLEAR MEDICINE VENTILATION - PERFUSION LUNG SCAN  Views: Anterior, posterior, left lateral, right  lateral, RPO, LPO, RAO, LAO -ventilation and perfusion  Radionuclide: Technetium 6035m DTPA -ventilation; Technetium 3735m macroaggregated albumin-perfusion  Dose:  6.0 mCi-ventilation; 40.0 mCi- perfusion  Route of administration: Inhalation-ventilation; intravenous- perfusion  COMPARISON:  Chest radiograph May 30, 2014  FINDINGS: Ventilation: Radiotracer uptake bilaterally is essentially homogeneous and symmetric bilaterally. No focal ventilation defects are appreciable.  Perfusion: Radiotracer uptake bilaterally is essentially homogeneous and symmetric bilaterally. No focal perfusion defects are identified.  There is no appreciable ventilation/perfusion mismatch.  IMPRESSION: No ventilation or perfusion defects are appreciable. This study  constitutes a very low probability of pulmonary embolus.   Electronically Signed   By: Bretta Bang M.D.   On: 05/30/2014 16:50   Dg Chest Port 1 View  05/30/2014   CLINICAL DATA:  Shortness of breath, stroke, atrial fibrillation  EXAM: PORTABLE CHEST - 1 VIEW  COMPARISON:  Portable exam 1018 hr compared to 05/21/2014  FINDINGS: Rotated to the RIGHT.  Enlargement of cardiac silhouette with pulmonary vascular congestion.  Mediastinal contours normal.  Bibasilar atelectasis versus infiltrate  No pleural effusion or pneumothorax.  Minimal central peribronchial thickening.  No acute osseous findings.  IMPRESSION: Enlargement of cardiac silhouette with pulmonary vascular congestion.  Minimal bronchitic changes with bibasilar atelectasis versus infiltrate.   Electronically Signed   By: Ulyses Southward M.D.   On: 05/30/2014 10:31   Dg C-arm 1-60 Min  05/22/2014   CLINICAL DATA:  Left hip ORIF  EXAM: DG C-ARM 61-120 MIN; LEFT FEMUR - 2 VIEW  : COMPARISON:  05/21/2014  FINDINGS: Status post ORIF of an intertrochanteric left femur fracture. Fracture alignment is excellent. No evidence of hardware complication, including the periprosthetic fracture.  IMPRESSION: Intertrochanteric  left femur fracture status post ORIF. No adverse findings.   Electronically Signed   By: Tiburcio Pea M.D.   On: 05/22/2014 02:36   ASSESSMENT AND PLAN:  1. Atrial fibrillation, persistent and rate controlled: Continue home dose of beta blocker and titrate for rate control. He is on coumadin. INR is 2.8 today.  Digoxin on hold with renal insufficiency. We will follow with you and make further recommendations.  2. Acute renal insufficiency: Per primary team. Possible obstructive uropathy. Hematuria after initial clear urine stream per nursing after foley placed.  3. Dyspnea with unilateral LE edema: High risk for PE with recent hip fracture and immobility. Agree with V/Q scan to exclude PE.  4. Hyperkalemia: management plan currently underway per primary team.  5.  Acute diastolic CHF diuresing well on IV Lasix.  He is net 6.5L neg.  Needs daily weights.  Check 2D echo to assess LVF.  His weight 2 weeks ago in office with me was 171lbs and was 185lbs yesterday so fairly volume overloaded as noted with increased BNP.  Continue IV lasix.   Quintella Reichert, MD  05/31/2014  11:25 AM

## 2014-05-31 NOTE — Progress Notes (Signed)
Note: This document was prepared with digital dictation and possible smart phrase technology. Any transcriptional errors that result from this process are unintentional.   Erik Arnold FXJ:[id number] DOB: May 21, 1924 DOA: 05/30/2014 PCP: Quintella Reichert, MD  Brief narrative: 80 y/o ?, h/o recent admit 9/21-9/24/15 for L hip #, Afib CHad2Vasc2 score~4 not on coumadin 2/2 to falls, prior TIA 07/2012, re-presented from [facility] c ? wob/sob and LE edema.  Found to have acute kidney injury and hyperkalemia and Afib c RVR  Past medical history-As per Problem list Chart reviewed as below- reviewed  Consultants:   Nephrology  Cardiology  Procedures:   none  Antibiotics:  none   Subjective  Doing well this morning. Did not sleep last night and is hungry No chest pain no shortness of breath no other issues   Objective    Interim History: None  Telemetry: Atrial fibrillation   Objective: Filed Vitals:   05/31/14 0330 05/31/14 0400 05/31/14 0500 05/31/14 0600  BP:  111/51 95/44 109/54  Pulse: 146 90 90 72  Temp:      TempSrc:      Resp: 29 16 15 25   Height:      Weight:      SpO2: 97% 100% 100% 100%    Intake/Output Summary (Last 24 hours) at 05/31/14 0713 Last data filed at 05/31/14 0600  Gross per 24 hour  Intake   1745 ml  Output   8150 ml  Net  -6405 ml    Exam:  General: EOMI NCAT Cardiovascular: S1-S2 no murmur rub or gallop Respiratory: Clinically clear no added sounds or rhonchi Abdomen: Soft nontender nondistended no rebound Skin multiple seborrheic keratoses Neuro intact. No real problems equally.  Data Reviewed: Basic Metabolic Panel:  Recent Labs Lab 05/30/14 1005 05/30/14 1317 05/30/14 1329 05/30/14 1900 05/30/14 2116 05/30/14 2340 05/31/14 0401  NA 128*  --  133* 134* 139  --  140  K >7.7*  --  6.4* 5.5* 5.9*  --  5.2  CL 92*  --  94* 95* 98  --  100  CO2 11*  --  17* 18* 22  --  26  GLUCOSE 110*  --  81 87 97  --  91  BUN 140*  --   130* 111* 105*  --  78*  CREATININE 10.68*  --  8.44* 6.11* 5.47*  --  3.12*  CALCIUM 8.4  --  9.5 9.0 9.0  --  8.8  MG  --  2.9*  --   --   --  2.4  --   PHOS  --   --  7.1* 5.9*  --   --   --    Liver Function Tests:  Recent Labs Lab 05/30/14 1317 05/30/14 1329 05/30/14 1900 05/30/14 2116 05/31/14 0401  AST 19 18  --  17 16  ALT 11 11  --  10 10  ALKPHOS 88 85  --  78 73  BILITOT 0.4 0.4  --  0.5 0.7  PROT 6.7 6.6  --  6.2 5.8*  ALBUMIN 2.8* 2.8* 2.7* 2.6* 2.6*   No results found for this basename: LIPASE, AMYLASE,  in the last 168 hours No results found for this basename: AMMONIA,  in the last 168 hours CBC:  Recent Labs Lab 05/30/14 1005 05/31/14 0401  WBC 14.7* 8.2  NEUTROABS 10.6* 6.0  HGB 10.6* 9.3*  HCT 30.6* 27.0*  MCV 108.5* 105.5*  PLT 320 324   Cardiac Enzymes:  Recent Labs  Lab 05/30/14 1317 05/30/14 1917 05/31/14 0141  TROPONINI <0.30 <0.30 <0.30   BNP: No components found with this basename: POCBNP,  CBG: No results found for this basename: GLUCAP,  in the last 168 hours  Recent Results (from the past 240 hour(s))  URINE CULTURE     Status: None   Collection Time    05/21/14 10:59 AM      Result Value Ref Range Status   Specimen Description URINE, RANDOM   Final   Special Requests NONE   Final   Culture  Setup Time     Final   Value: 05/21/2014 14:30     Performed at [facility]   Colony Count     Final   Value: 20,OOO COLONIES/ML     Performed at [facility]   Culture     Final   Value: Multiple bacterial morphotypes present, none predominant. Suggest appropriate recollection if clinically indicated.     Performed at [facility]   Report Status 05/22/2014 FINAL   Final  SURGICAL PCR SCREEN     Status: None   Collection Time    05/21/14  8:33 PM      Result Value Ref Range Status   MRSA, PCR NEGATIVE  NEGATIVE Final   Staphylococcus aureus NEGATIVE  NEGATIVE Final   Comment:            The Xpert SA  Assay (FDA     approved for NASAL specimens     in patients over 78 years of age),     is one component of     a comprehensive surveillance     program.  Test performance has     been validated by [facility] for patients greater     than or equal to 78 year old.     It is not intended     to diagnose infection nor to     guide or monitor treatment.     Studies:              All Imaging reviewed and is as per above notation   Scheduled Meds: . antiseptic oral rinse  7 mL Mouth Rinse BID  . furosemide  40 mg Intravenous Daily  . metoprolol tartrate  12.5 mg Oral BID   Continuous Infusions: . sodium chloride       Assessment/Plan:  1. Acute obstructive uropathy-creatinine trending down well with Foley placement. Start Flomax 0.4 mg daily.  At this stage would not screen for PSA 2. Acute kidney injury resolving as above. Continue IV fluid and change to normal saline from a bicarbonate drip 3. Chronic atrial fibrillation-continue Toprol 12.5 twice a day. Hold digoxin given renal function.   4. Acute hypoxic respiratory failure secondary to pulmonary edema. The set screw 5. Metabolic acidosis-resolved. 6. Hyperkalemia-possibly a result of metabolic acidosis and renal insufficiency. Should resolve. Hold Kayexalate 7. Recent hip fracture-place on Coumadin per pharmacy consult. Monitor 8. Hypervolemic hyponatremia-resolved 9. Prior TIA-aspirin Plavix on hold. May resume after Coumadin course and in about one month  Code Status: DO NOT RESUSCITATE Family Communication: ? # fr HCPOA Curtis,Delois Sister [phone]--updated. Disposition Plan: inpatient SDU   Pleas KochJai Bradd Merlos, MD  [facility] Pager [phone] 05/31/2014, 7:13 AM    LOS: 1 day

## 2014-05-31 NOTE — Progress Notes (Signed)
ANTICOAGULATION CONSULT NOTE - Initial Consult  Pharmacy Consult for Coumadin Indication: atrial fibrillation  No Known Allergies  Patient Measurements: Height: 5\' 8"  (172.7 cm) Weight: 185 lb 6.5 oz (84.1 kg) IBW/kg (Calculated) : 68.4 Heparin Dosing Weight:    Vital Signs: Temp: 98 F (36.7 C) (10/01 0700) Temp src: Oral (10/01 0700) BP: 110/45 mmHg (10/01 0700) Pulse Rate: 84 (10/01 0700)  Labs:  Recent Labs  05/30/14 1005 05/30/14 1317  05/30/14 1900 05/30/14 1917 05/30/14 2116 05/31/14 0141 05/31/14 0401  HGB 10.6*  --   --   --   --   --   --  9.3*  HCT 30.6*  --   --   --   --   --   --  27.0*  PLT 320  --   --   --   --   --   --  324  LABPROT 29.5*  --   --   --   --   --   --  28.1*  INR 2.80*  --   --   --   --   --   --  2.63*  CREATININE 10.68*  --   < > 6.11*  --  5.47*  --  3.12*  TROPONINI  --  <0.30  --   --  <0.30  --  <0.30  --   < > = values in this interval not displayed.  Estimated Creatinine Clearance: 17.3 ml/min (by C-G formula based on Cr of 3.12).   Medical History: Past Medical History  Diagnosis Date  . Stroke 08/22/2012    residual left sided weakness (08/23/2012)  . TIA (transient ischemic attack)   . Carpal tunnel syndrome     diagnosed in 1997  . Chronic atrial fibrillation     Not on coumadin as he was deemed a fall risk after he fell in his cardiologists office.    Medications:  Prescriptions prior to admission  Medication Sig Dispense Refill  . aspirin EC 325 MG EC tablet Take 1 tablet (325 mg total) by mouth daily with breakfast.  30 tablet  0  . bisacodyl (DULCOLAX) 5 MG EC tablet Take 1 tablet (5 mg total) by mouth daily as needed for moderate constipation.  30 tablet  0  . digoxin (LANOXIN) 0.125 MG tablet Take 1 tablet (0.125 mg total) by mouth daily.  30 tablet  11  . docusate sodium 100 MG CAPS Take 100 mg by mouth 2 (two) times daily.  10 capsule  0  . HYDROcodone-acetaminophen (NORCO/VICODIN) 5-325 MG per  tablet Take 1-2 tablets by mouth every 4 (four) hours as needed for moderate pain.  60 tablet  0  . iron polysaccharides (NIFEREX) 150 MG capsule Take 150 mg by mouth daily.      . metoprolol tartrate (LOPRESSOR) 25 MG tablet Take 0.5 tablets (12.5 mg total) by mouth 2 (two) times daily.  30 tablet  11  . polyethylene glycol (MIRALAX / GLYCOLAX) packet Take 17 g by mouth daily.  14 each  0  . senna (SENOKOT) 8.6 MG TABS tablet Take 1 tablet by mouth daily.      Marland Kitchen. warfarin (COUMADIN) 4 MG tablet Take 4 mg by mouth daily.      Marland Kitchen. aspirin 81 MG EC tablet Take 1 tablet (81 mg total) by mouth daily. Resume after 2 weeks, Swallow whole.  30 tablet  12  . clopidogrel (PLAVIX) 75 MG tablet Take 1 tablet (75 mg total) by  mouth daily. Resume once on ASA 81mg   30 tablet  11    Assessment: 80 y/o M presents with SOB, increased WOB, LE edema. Previously admitted 9/21 to 9/24 for L hip fx.  Anticoagulation: Coumadin post hip fx repair. Has afib with CHad2Vasc2 score~4. INR 2.63 - Admit MD note: INR was greater than 8 on 9/28 and On 9/29 was 4.0 so his plavix and asa had been held. Hgb 9.3 - Home Coumadin dose 4mg  daily with admit INR 2.8  Infectious Disease: WBC 8.2.  Cardiovascular: Afib, new R bungle branch block, elevated BNP 3632. Enzymes negative. IV Lasix, metoprolol. VSS  Gastrointestinal / Nutrition: LFT's WNL.  Neurology: h/o TIA/CVA 12/13. Holding ASA/Plavix.  Nephrology: B obstructive uropathy. ARF with hyperkalemia. Foley catheter was placed and drained >1L frank hematuria initially. Start Flomax. Scr down to 3.12  Pulmonary: Acute hypoxic respiratory failure.   Hematology / Oncology: post-op anemia  PTA Medication Issues.  Goal of Therapy:  INR 2-3 Monitor platelets by anticoagulation protocol: Yes   Plan:  Resume Coumadin 4mg  daily.   Dametria Tuzzolino S. Merilynn Finland, PharmD, BCPS Clinical Staff Pharmacist Pager [phone]  [name] 05/31/2014,10:44 AM

## 2014-05-31 NOTE — Progress Notes (Signed)
Pt still with elevated K+ called midlevel awaiting call back. Will continue to monitor.

## 2014-05-31 NOTE — Progress Notes (Signed)
[facility] ROUNDING NOTE   Subjective:   Interval History: stable   Objective:  Vital signs in last 24 hours:  Temp:  [97.5 F (36.4 C)-98.3 F (36.8 C)] 98 F (36.7 C) (10/01 0700) Pulse Rate:  [46-152] 84 (10/01 0700) Resp:  [13-34] 19 (10/01 0700) BP: (88-145)/(43-104) 110/45 mmHg (10/01 0700) SpO2:  [91 %-100 %] 100 % (10/01 0700) Weight:  [84.1 kg (185 lb 6.5 oz)] 84.1 kg (185 lb 6.5 oz) (09/30 1732)  Weight change:  Filed Weights   05/30/14 1732  Weight: 84.1 kg (185 lb 6.5 oz)    Intake/Output: I/O last 3 completed shifts: In: 1745 [P.O.:240; I.V.:1505] Out: 8150 [Urine:8150]   Intake/Output this shift:  Total I/O In: 540 [P.O.:240; I.V.:300] Out: 750 [Urine:750]  General: Well developed, well nourished, in no acute distress  Head: Eyes PERRLA, No xanthomas. Normal cephalic and atramatic  Lungs: Few scattered wheezes  Heart: Irregularly irregular S1 S2 Pulses are 2+ & equal  Abdomen: Bowel sounds are positive, abdomen soft and non-tender without masses  Extremities: No clubbing, cyanosis or edema. DP +1  Neuro: Alert and oriented X 3.  Psych: Good affect, responds appropriately    Basic Metabolic Panel:  Recent Labs Lab 05/30/14 1005 05/30/14 1317 05/30/14 1329 05/30/14 1900 05/30/14 2116 05/30/14 2340 05/31/14 0401  NA 128*  --  133* 134* 139  --  140  K >7.7*  --  6.4* 5.5* 5.9*  --  5.2  CL 92*  --  94* 95* 98  --  100  CO2 11*  --  17* 18* 22  --  26  GLUCOSE 110*  --  81 87 97  --  91  BUN 140*  --  130* 111* 105*  --  78*  CREATININE 10.68*  --  8.44* 6.11* 5.47*  --  3.12*  CALCIUM 8.4  --  9.5 9.0 9.0  --  8.8  MG  --  2.9*  --   --   --  2.4  --   PHOS  --   --  7.1* 5.9*  --   --   --     Liver Function Tests:  Recent Labs Lab 05/30/14 1317 05/30/14 1329 05/30/14 1900 05/30/14 2116 05/31/14 0401  AST 19 18  --  17 16  ALT 11 11  --  10 10  ALKPHOS 88 85  --  78 73  BILITOT 0.4 0.4  --  0.5 0.7  PROT 6.7  6.6  --  6.2 5.8*  ALBUMIN 2.8* 2.8* 2.7* 2.6* 2.6*   No results found for this basename: LIPASE, AMYLASE,  in the last 168 hours No results found for this basename: AMMONIA,  in the last 168 hours  CBC:  Recent Labs Lab 05/30/14 1005 05/31/14 0401  WBC 14.7* 8.2  NEUTROABS 10.6* 6.0  HGB 10.6* 9.3*  HCT 30.6* 27.0*  MCV 108.5* 105.5*  PLT 320 324    Cardiac Enzymes:  Recent Labs Lab 05/30/14 1317 05/30/14 1917 05/31/14 0141  TROPONINI <0.30 <0.30 <0.30    BNP: No components found with this basename: POCBNP,   CBG:  Recent Labs Lab 05/31/14 0739  GLUCAP 107*    Microbiology: Results for orders placed during the hospital encounter of 05/21/14  URINE CULTURE     Status: None   Collection Time    05/21/14 10:59 AM      Result Value Ref Range Status   Specimen Description URINE, RANDOM   Final  Special Requests NONE   Final   Culture  Setup Time     Final   Value: 05/21/2014 14:30     Performed at [facility] Count     Final   Value: 20,OOO COLONIES/ML     Performed at [facility]   Culture     Final   Value: Multiple bacterial morphotypes present, none predominant. Suggest appropriate recollection if clinically indicated.     Performed at [facility]   Report Status 05/22/2014 FINAL   Final  SURGICAL PCR SCREEN     Status: None   Collection Time    05/21/14  8:33 PM      Result Value Ref Range Status   MRSA, PCR NEGATIVE  NEGATIVE Final   Staphylococcus aureus NEGATIVE  NEGATIVE Final   Comment:            The Xpert SA Assay (FDA     approved for NASAL specimens     in patients over 54 years of age),     is one component of     a comprehensive surveillance     program.  Test performance has     been validated by [employer] for patients greater     than or equal to 50 year old.     It is not intended     to diagnose infection nor to     guide or monitor treatment.    Coagulation Studies:  Recent  Labs  05/30/14 1005 05/31/14 0401  LABPROT 29.5* 28.1*  INR 2.80* 2.63*    Urinalysis:  Recent Labs  05/30/14 1134  COLORURINE YELLOW  LABSPEC 1.008  PHURINE 5.5  GLUCOSEU NEGATIVE  HGBUR MODERATE*  BILIRUBINUR NEGATIVE  KETONESUR NEGATIVE  PROTEINUR NEGATIVE  UROBILINOGEN 0.2  NITRITE NEGATIVE  LEUKOCYTESUR NEGATIVE      Imaging: Nm Pulmonary Perf And Vent  05/30/2014   CLINICAL DATA:  Shortness of Breath  EXAM: NUCLEAR MEDICINE VENTILATION - PERFUSION LUNG SCAN  Views: Anterior, posterior, left lateral, right lateral, RPO, LPO, RAO, LAO -ventilation and perfusion  Radionuclide: Technetium 38m DTPA -ventilation; Technetium 79m macroaggregated albumin-perfusion  Dose:  6.0 mCi-ventilation; 40.0 mCi- perfusion  Route of administration: Inhalation-ventilation; intravenous- perfusion  COMPARISON:  Chest radiograph May 30, 2014  FINDINGS: Ventilation: Radiotracer uptake bilaterally is essentially homogeneous and symmetric bilaterally. No focal ventilation defects are appreciable.  Perfusion: Radiotracer uptake bilaterally is essentially homogeneous and symmetric bilaterally. No focal perfusion defects are identified.  There is no appreciable ventilation/perfusion mismatch.  IMPRESSION: No ventilation or perfusion defects are appreciable. This study constitutes a very low probability of pulmonary embolus.   Electronically Signed   By: Bretta Bang M.D.   On: 05/30/2014 16:50   Dg Chest Port 1 View  05/30/2014   CLINICAL DATA:  Shortness of breath, stroke, atrial fibrillation  EXAM: PORTABLE CHEST - 1 VIEW  COMPARISON:  Portable exam 1018 hr compared to 05/21/2014  FINDINGS: Rotated to the RIGHT.  Enlargement of cardiac silhouette with pulmonary vascular congestion.  Mediastinal contours normal.  Bibasilar atelectasis versus infiltrate  No pleural effusion or pneumothorax.  Minimal central peribronchial thickening.  No acute osseous findings.  IMPRESSION: Enlargement of cardiac  silhouette with pulmonary vascular congestion.  Minimal bronchitic changes with bibasilar atelectasis versus infiltrate.   Electronically Signed   By: Ulyses Southward M.D.   On: 05/30/2014 10:31     Medications:   . sodium chloride 75  mL/hr at 05/31/14 1100   . antiseptic oral rinse  7 mL Mouth Rinse BID  . furosemide  40 mg Intravenous Daily  . metoprolol tartrate  12.5 mg Oral BID  . tamsulosin  0.4 mg Oral QPC breakfast  . warfarin  4 mg Oral q1800  . Warfarin - Pharmacist Dosing Inpatient   Does not apply q1800     Assessment/ Plan:   Acute renal failure appears to have resolved with relief of obstruction.   Hyperkalemia resoved this morning  Discussed with Dr Gloriann Loan and will sign off   LOS: 1 Erik Arnold W @TODAY @11 :45 AM

## 2014-05-31 NOTE — Progress Notes (Signed)
Utilization review completed.  

## 2014-05-31 NOTE — Clinical Social Work Psychosocial (Signed)
Clinical Social Work Department BRIEF PSYCHOSOCIAL ASSESSMENT 05/31/2014  Patient:  Erik Arnold, Erik Arnold     Account Number:  [id number]     Admit date:  05/30/2014  Clinical Social Worker:  Bo Mcclintock  Date/Time:  05/31/2014 12:12 PM  Referred by:  RN  Date Referred:  05/31/2014 Referred for  SNF Placement   Other Referral:   Interview type:  Family Other interview type:   Pt is disoriented; Sister Lovett Sox [phone]    PSYCHOSOCIAL DATA Living Status:  FAMILY Admitted from facility:  [facility] [place] Level of care:  Skilled Nursing Facility Primary support name:  Lovett Sox Primary support relationship to patient:  SIBLING Degree of support available:   strong    CURRENT CONCERNS  Other Concerns:  The pt has an apoointment October 5th at [facility] [address] [place], [place], [place] [address] [phone] to remove stitches.   SOCIAL WORK ASSESSMENT / PLAN CSW spoke with the pt's sister Delois. CSW introduce self and purpose of visit. Delois reported the pt was admitted to [facility] to receive rehab from his hip surgery. Delois reported the family placed a bed hold for the pt. CSW explained the SNF process to the Delois. CSW provided the pt with contact information for further questions. CSW will continue to follow this pt and assist with discharge as needed.   Assessment/plan status:   Other assessment/ plan:   return back to [facility]   Information/referral to community resources:    PATIENT'S/FAMILY'S RESPONSE TO PLAN OF CARE: agreed    Gwynne Edinger, MSW, LCSWA [phone]

## 2014-06-01 DIAGNOSIS — I359 Nonrheumatic aortic valve disorder, unspecified: Secondary | ICD-10-CM

## 2014-06-01 LAB — BASIC METABOLIC PANEL
Anion gap: 10 (ref 5–15)
BUN: 36 mg/dL — ABNORMAL HIGH (ref 6–23)
CO2: 26 mEq/L (ref 19–32)
Calcium: 7.8 mg/dL — ABNORMAL LOW (ref 8.4–10.5)
Chloride: 106 mEq/L (ref 96–112)
Creatinine, Ser: 0.91 mg/dL (ref 0.50–1.35)
GFR calc non Af Amer: 73 mL/min — ABNORMAL LOW (ref >90)
GFR, EST AFRICAN AMERICAN: 85 mL/min — AB (ref >90)
Glucose, Bld: 101 mg/dL — ABNORMAL HIGH (ref 70–99)
POTASSIUM: 4 meq/L (ref 3.7–5.3)
SODIUM: 142 meq/L (ref 137–147)

## 2014-06-01 LAB — CBC WITH DIFFERENTIAL/PLATELET
Basophils Absolute: 0.1 10*3/uL (ref 0.0–0.1)
Basophils Relative: 1 % (ref 0–1)
EOS PCT: 3 % (ref 0–5)
Eosinophils Absolute: 0.2 10*3/uL (ref 0.0–0.7)
HCT: 24.6 % — ABNORMAL LOW (ref 39.0–52.0)
Hemoglobin: 8.1 g/dL — ABNORMAL LOW (ref 13.0–17.0)
Lymphocytes Relative: 12 % (ref 12–46)
Lymphs Abs: 0.9 10*3/uL (ref 0.7–4.0)
MCH: 36.8 pg — ABNORMAL HIGH (ref 26.0–34.0)
MCHC: 32.9 g/dL (ref 30.0–36.0)
MCV: 111.8 fL — ABNORMAL HIGH (ref 78.0–100.0)
MONO ABS: 1.3 10*3/uL — AB (ref 0.1–1.0)
Monocytes Relative: 17 % — ABNORMAL HIGH (ref 3–12)
NEUTROS PCT: 67 % (ref 43–77)
Neutro Abs: 5.3 10*3/uL (ref 1.7–7.7)
PLATELETS: 327 10*3/uL (ref 150–400)
RBC: 2.2 MIL/uL — AB (ref 4.22–5.81)
RDW: 15.3 % (ref 11.5–15.5)
WBC: 7.8 10*3/uL (ref 4.0–10.5)

## 2014-06-01 LAB — GLUCOSE, CAPILLARY: Glucose-Capillary: 106 mg/dL — ABNORMAL HIGH (ref 70–99)

## 2014-06-01 LAB — PROTIME-INR
INR: 2.42 — AB (ref 0.00–1.49)
PROTHROMBIN TIME: 26.3 s — AB (ref 11.6–15.2)

## 2014-06-01 MED ORDER — ACETAMINOPHEN 500 MG PO TABS
500.0000 mg | ORAL_TABLET | Freq: Four times a day (QID) | ORAL | Status: DC | PRN
  Administered 2014-06-01 – 2014-06-03 (×6): 500 mg via ORAL
  Filled 2014-06-01 (×6): qty 1

## 2014-06-01 MED ORDER — FUROSEMIDE 40 MG PO TABS
40.0000 mg | ORAL_TABLET | Freq: Two times a day (BID) | ORAL | Status: DC
  Administered 2014-06-01 – 2014-06-03 (×4): 40 mg via ORAL
  Filled 2014-06-01 (×7): qty 1

## 2014-06-01 MED ORDER — TRAMADOL HCL 50 MG PO TABS
50.0000 mg | ORAL_TABLET | Freq: Once | ORAL | Status: AC
  Administered 2014-06-01: 50 mg via ORAL
  Filled 2014-06-01: qty 1

## 2014-06-01 MED ORDER — FINASTERIDE 5 MG PO TABS
5.0000 mg | ORAL_TABLET | Freq: Every day | ORAL | Status: DC
  Administered 2014-06-02 – 2014-06-04 (×3): 5 mg via ORAL
  Filled 2014-06-01 (×4): qty 1

## 2014-06-01 NOTE — Progress Notes (Signed)
Foley clamped per MD orders to check for sensation to void.  Patient without complaints.

## 2014-06-01 NOTE — Progress Notes (Signed)
Echocardiogram 2D Echocardiogram has been performed.  Erik Arnold 06/01/2014, 12:19 PM

## 2014-06-01 NOTE — Progress Notes (Addendum)
12 pm checked and still no sensations to void. 1520 bladder scan done and 225 cc's  in bladder no urge to void, Dr. Mahala MenghiniSamtani notified and orders to unclamp foley. Received 300 cc's after foley unclamped.  Patient without complaints.

## 2014-06-01 NOTE — Progress Notes (Signed)
HR down to 38, patient resting eyes closed, awaken, no complaints. Dr. Mahala Menghini aware, will continue to watch.

## 2014-06-01 NOTE — Progress Notes (Signed)
Pt c/o left hip pain. Pt given prn tyelnol with little relief. Called midlevel awaiting call back.

## 2014-06-01 NOTE — Progress Notes (Addendum)
Note: This document was prepared with digital dictation and possible smart phrase technology. Any transcriptional errors that result from this process are unintentional.   Erik HoppingOren Arnold AVW:[id number] DOB: 10/31/1923 DOA: 05/30/2014 PCP: Erik Arnold  Brief narrative: 80 y/o ?, h/o recent admit 9/21-9/24/15 for L hip #, Afib CHad2Vasc2 score~4 not on coumadin 2/2 to falls, prior TIA 07/2012, re-presented from [facility] c ? wob/sob and LE edema.  Found to have acute kidney injury and hyperkalemia and Afib c RVR  Past medical history-As per Problem list Chart reviewed as below- reviewed  Consultants:   Nephrology  Cardiology  Procedures:   none  Antibiotics:  none   Subjective   Continues to have pain in left lower extremity with some mild swelling. Staples are still intact. No nausea vomiting tolerating diet. No fever no chills   Objective    Interim History: Nursing reports some pauses on telemetry as well as some pain   Telemetry: Atrial fibrillation   Objective: Filed Vitals:   06/01/14 0726 06/01/14 0727 06/01/14 0728 06/01/14 0740  BP: 108/45     Pulse: 97 96 79   Temp:    98 F (36.7 C)  TempSrc:    Oral  Resp: 22 22 27    Height:      Weight:      SpO2: 99% 98% 97%     Intake/Output Summary (Last 24 hours) at 06/01/14 [id number] Last data filed at 06/01/14 0800  Gross per 24 hour  Intake   2090 ml  Output   3975 ml  Net  -1885 ml    Exam:  General: EOMI NCAT Cardiovascular: S1-S2 no murmur rub or gallop Respiratory: Clinically clear no added sounds or rhonchi Abdomen: Soft nontender nondistended no rebound Skin multiple seborrheic keratoses Neuro intact. No real problems equally.  Data Reviewed: Basic Metabolic Panel:  Recent Labs Lab 05/30/14 1005 05/30/14 1317 05/30/14 1329 05/30/14 1900 05/30/14 2116 05/30/14 2340 05/31/14 0401 06/01/14 0315  NA 128*  --  133* 134* 139  --  140 142  K >7.7*  --  6.4* 5.5* 5.9*  --  5.2 4.0  CL  92*  --  94* 95* 98  --  100 106  CO2 11*  --  17* 18* 22  --  26 26  GLUCOSE 110*  --  81 87 97  --  91 101*  BUN 140*  --  130* 111* 105*  --  78* 36*  CREATININE 10.68*  --  8.44* 6.11* 5.47*  --  3.12* 0.91  CALCIUM 8.4  --  9.5 9.0 9.0  --  8.8 7.8*  MG  --  2.9*  --   --   --  2.4  --   --   PHOS  --   --  7.1* 5.9*  --   --   --   --    Liver Function Tests:  Recent Labs Lab 05/30/14 1317 05/30/14 1329 05/30/14 1900 05/30/14 2116 05/31/14 0401  AST 19 18  --  17 16  ALT 11 11  --  10 10  ALKPHOS 88 85  --  78 73  BILITOT 0.4 0.4  --  0.5 0.7  PROT 6.7 6.6  --  6.2 5.8*  ALBUMIN 2.8* 2.8* 2.7* 2.6* 2.6*   No results found for this basename: LIPASE, AMYLASE,  in the last 168 hours No results found for this basename: AMMONIA,  in the last 168 hours CBC:  Recent Labs Lab 05/30/14  1005 05/31/14 0401 06/01/14 0315  WBC 14.7* 8.2 7.8  NEUTROABS 10.6* 6.0 5.3  HGB 10.6* 9.3* 8.1*  HCT 30.6* 27.0* 24.6*  MCV 108.5* 105.5* 111.8*  PLT 320 324 327   Cardiac Enzymes:  Recent Labs Lab 05/30/14 1317 05/30/14 1917 05/31/14 0141  TROPONINI <0.30 <0.30 <0.30   BNP: No components found with this basename: POCBNP,  CBG:  Recent Labs Lab 05/31/14 0739 06/01/14 0741  GLUCAP 107* 106*    Recent Results (from the past 240 hour(s))  URINE CULTURE     Status: None   Collection Time    05/30/14 11:34 AM      Result Value Ref Range Status   Specimen Description URINE, CATHETERIZED   Final   Special Requests NONE   Final   Culture  Setup Time     Final   Value: 05/30/2014 18:15     Performed at [facility] Count     Final   Value: NO GROWTH     Performed at [facility]   Culture     Final   Value: NO GROWTH     Performed at [facility]   Report Status 05/31/2014 FINAL   Final     Studies:              All Imaging reviewed and is as per above notation   Scheduled Meds: . antiseptic oral rinse  7 mL Mouth Rinse BID  .  furosemide  40 mg Intravenous Daily  . metoprolol tartrate  12.5 mg Oral BID  . tamsulosin  0.4 mg Oral QPC breakfast  . warfarin  4 mg Oral q1800  . Warfarin - Pharmacist Dosing Inpatient   Does not apply q1800   Continuous Infusions: . sodium chloride 75 mL/hr at 06/01/14 0800     Assessment/Plan:  1. Acute obstructive uropathy-creatinine trending down well with Foley placement. Start Flomax 0.4 mg daily.  Clamp the Foley catheter to see if patient has sensation to void . If not we will consult neurology and patient may need outpatient evaluation of bladder and kidney with cystourethrogram . At this stage would not screen for PSA.   2. Acute kidney injury resolving as above. Saline lock IV fluids 10/2.  I have discontinued Lasix 10/2 as well and placed him on by mouth Lasix 40 twice a day 3. Chronic atrial fibrillation-continue Toprol 12.5 twice a day. Hold digoxin given renal function. My not be a candidate for this long-term-defer to cardiology. Echocardiogram currently is pending  4. Bradycardia-section secondary to metoprolol. Defer to cardiology. No pauses 5. Catheter-induced hematuria-resolved-urine stream is clear 6. Acute hypoxic respiratory failure secondary to pulmonary edema. VQ scan was negative Echocardiogram still pending 7. Metabolic acidosis-resolved. 8. Hyperkalemia-possibly a result of metabolic acidosis and renal insufficiency. Potassium of 4.0 9. Recent hip fracture-place on Coumadin per pharmacy consult. Monitor.  INR 2.4 10. Hypervolemic hyponatremia-resolved 11. Prior TIA-aspirin as well as Plavix on hold. May resume after Coumadin course and in about one month  Code Status: DO NOT RESUSCITATE Family Communication: No family at the bedside currently Disposition Plan: inpatient SDU-likely can transfer to telemetry bed later today.  Potential transfer and discharge to skilled facility 05/31/49 if ankle recurrence normal and no other issues with heart rate   Pleas Koch, Arnold  [facility] Pager [phone] 06/01/2014, 9:22 AM    LOS: 2 days

## 2014-06-01 NOTE — Consult Note (Signed)
Patient seen and examined. I agree with H&P, A/P by Dr. Laural Benes. I discussed with patient nature r/b/a to 5alpha reductase inhibitors and will add finasteride. We discussed FDA warnings on higher grade prostate cancer and sexual dysfunction. Pt has a large prostate and max medical therapy with tamsulosin and finasteride is best hope for voiding. Continue foley, change every 3-4 weeks, f/u in office.

## 2014-06-01 NOTE — Progress Notes (Signed)
Medicare Important Message given?  YES (If response is "NO", the following Medicare IM given date fields will be blank) Date Medicare IM given:  06/01/2014 Medicare IM given by:  Trixie Maclaren 

## 2014-06-01 NOTE — Progress Notes (Signed)
ANTICOAGULATION CONSULT NOTE - Follow Up Consult  Pharmacy Consult for Coumadin Indication: atrial fibrillation  No Known Allergies  Patient Measurements: Height: 5\' 8"  (172.7 cm) Weight: 184 lb 1.4 oz (83.5 kg) IBW/kg (Calculated) : 68.4 Heparin Dosing Weight:    Vital Signs: Temp: 98 F (36.7 C) (10/02 0740) Temp Source: Oral (10/02 0740) BP: 98/45 mmHg (10/02 1128) Pulse Rate: 73 (10/02 1130)  Labs:  Recent Labs  05/30/14 1005 05/30/14 1317  05/30/14 1917 05/30/14 2116 05/31/14 0141 05/31/14 0401 06/01/14 0315  HGB 10.6*  --   --   --   --   --  9.3* 8.1*  HCT 30.6*  --   --   --   --   --  27.0* 24.6*  PLT 320  --   --   --   --   --  324 327  LABPROT 29.5*  --   --   --   --   --  28.1* 26.3*  INR 2.80*  --   --   --   --   --  2.63* 2.42*  CREATININE 10.68*  --   < >  --  5.47*  --  3.12* 0.91  TROPONINI  --  <0.30  --  <0.30  --  <0.30  --   --   < > = values in this interval not displayed.  Estimated Creatinine Clearance: 59 ml/min (by C-G formula based on Cr of 0.91).   Assessment: 80 y/o M presents with SOB, increased WOB, LE edema. Previously admitted 9/21 to 9/24 for L hip fx.  Anticoagulation: Coumadin post hip fx repair. Has afib with CHad2Vasc2 score~4. INR 2.42. Hgb down 9.3>>8.1. Watch. - Admit MD note: INR was greater than 8 on 9/28 and On 9/29 was 4.0 so his plavix and asa had been held. - Home Coumadin dose 4mg  daily with admit INR 2.8  Cardiovascular: Afib, new R bungle branch block, elevated BNP 3632. Enzymes negative. poLasix, metoprolol. BP 98/45, HR 73  Gastrointestinal / Nutrition: LFT's WNL.  Neurology: h/o TIA/CVA 12/13. Holding ASA/Plavix.  Nephrology: B obstructive uropathy. ARF with hyperkalemia. Foley catheter was placed and drained >1L frank hematuria initially. Start Flomax. Scr down to 3.12>>0.91.  Pulmonary: Acute hypoxic respiratory failure.   Hematology / Oncology: post-op anemia  PTA Medication Issues.   Goal of  Therapy:  INR 2-3 Monitor platelets by anticoagulation protocol: Yes   Plan:  Resume Coumadin 4mg  daily.   Dorotea Hand S. Merilynn Finland, PharmD, BCPS Clinical Staff Pharmacist Pager [phone]  [name] 06/01/2014,11:51 AM

## 2014-06-01 NOTE — Progress Notes (Addendum)
SUBJECTIVE:  No complaints  OBJECTIVE:   Vitals:   Filed Vitals:   06/01/14 0727 06/01/14 0728 06/01/14 0740 06/01/14 0927  BP:    90/48  Pulse: 96 79  78  Temp:   98 F (36.7 C)   TempSrc:   Oral   Resp: 22 27    Height:      Weight:      SpO2: 98% 97%     I&O's:   Intake/Output Summary (Last 24 hours) at 06/01/14 [id number] Last data filed at 06/01/14 0800  Gross per 24 hour  Intake   2090 ml  Output   3975 ml  Net  -1885 ml   TELEMETRY: Reviewed telemetry pt in atrial fibrillation     PHYSICAL EXAM General: Well developed, well nourished, in no acute distress Head: Eyes PERRLA, No xanthomas.   Normal cephalic and atramatic  Lungs:   Clear bilaterally to auscultation and percussion. Heart:   Irregularly irregular S1 S2 Pulses are 2+ & equal. Abdomen: Bowel sounds are positive, abdomen soft and non-tender without masses  Extremities:   No clubbing, cyanosis or edema.  DP +1 Neuro: Alert and oriented X 3. Psych:  Good affect, responds appropriately   LABS: Basic Metabolic Panel:  Recent Labs  71/69/6709/30/15 1317 05/30/14 1329 05/30/14 1900  05/30/14 2340 05/31/14 0401 06/01/14 0315  NA  --  133* 134*  < >  --  140 142  K  --  6.4* 5.5*  < >  --  5.2 4.0  CL  --  94* 95*  < >  --  100 106  CO2  --  17* 18*  < >  --  26 26  GLUCOSE  --  81 87  < >  --  91 101*  BUN  --  130* 111*  < >  --  78* 36*  CREATININE  --  8.44* 6.11*  < >  --  3.12* 0.91  CALCIUM  --  9.5 9.0  < >  --  8.8 7.8*  MG 2.9*  --   --   --  2.4  --   --   PHOS  --  7.1* 5.9*  --   --   --   --   < > = values in this interval not displayed. Liver Function Tests:  Recent Labs  05/30/14 2116 05/31/14 0401  AST 17 16  ALT 10 10  ALKPHOS 78 73  BILITOT 0.5 0.7  PROT 6.2 5.8*  ALBUMIN 2.6* 2.6*   No results found for this basename: LIPASE, AMYLASE,  in the last 72 hours CBC:  Recent Labs  05/31/14 0401 06/01/14 0315  WBC 8.2 7.8  NEUTROABS 6.0 5.3  HGB 9.3* 8.1*  HCT 27.0* 24.6*   MCV 105.5* 111.8*  PLT 324 327   Cardiac Enzymes:  Recent Labs  05/30/14 1317 05/30/14 1917 05/31/14 0141  TROPONINI <0.30 <0.30 <0.30   BNP: No components found with this basename: POCBNP,  D-Dimer: No results found for this basename: DDIMER,  in the last 72 hours Hemoglobin A1C: No results found for this basename: HGBA1C,  in the last 72 hours Fasting Lipid Panel: No results found for this basename: CHOL, HDL, LDLCALC, TRIG, CHOLHDL, LDLDIRECT,  in the last 72 hours Thyroid Function Tests: No results found for this basename: TSH, T4TOTAL, FREET3, T3FREE, THYROIDAB,  in the last 72 hours Anemia Panel: No results found for this basename: VITAMINB12, FOLATE, FERRITIN, TIBC, IRON, RETICCTPCT,  in  the last 72 hours Coag Panel:   Lab Results  Component Value Date   INR 2.42* 06/01/2014   INR 2.63* 05/31/2014   INR 2.80* 05/30/2014    RADIOLOGY: Dg Chest 1 View  05/21/2014   CLINICAL DATA:  Left hip fracture following a fall.  EXAM: CHEST - 1 VIEW  COMPARISON:  None.  FINDINGS: Borderline enlarged cardiac silhouette. Tortuous aorta. Mildly prominent interstitial markings with minimal somewhat patchy density at the left lateral lung base. Old, healed left inferior rib fractures laterally. No acute fracture or pneumothorax. Thoracic spine and cervical spine degenerative changes.  IMPRESSION: Borderline cardiomegaly, mild chronic interstitial lung disease and small amount atelectasis or scarring at the left lung base.   Electronically Signed   By: Gordan Payment M.D.   On: 05/21/2014 12:01   Dg Hip Complete Left  05/21/2014   CLINICAL DATA:  Fall with left hip fracture.  EXAM: LEFT HIP - COMPLETE 2+ VIEW  COMPARISON:  None.  FINDINGS: There is an intertrochanteric fracture of the left femur with varus angulation of the fracture fragments. No additional evidence of an acute fracture. Bones appear demineralized. Degenerative changes are seen in the lumbosacral spine.  IMPRESSION: 1. Left  intertrochanteric femur fracture. 2. Osteopenia. 3. Lumbar spondylosis.   Electronically Signed   By: Leanna Battles M.D.   On: 05/21/2014 11:58   Dg Femur Left  05/22/2014   CLINICAL DATA:  Left hip ORIF  EXAM: DG C-ARM 61-120 MIN; LEFT FEMUR - 2 VIEW  : COMPARISON:  05/21/2014  FINDINGS: Status post ORIF of an intertrochanteric left femur fracture. Fracture alignment is excellent. No evidence of hardware complication, including the periprosthetic fracture.  IMPRESSION: Intertrochanteric left femur fracture status post ORIF. No adverse findings.   Electronically Signed   By: Tiburcio Pea M.D.   On: 05/22/2014 02:36   Dg Femur Left  05/21/2014   CLINICAL DATA:  Fall with left hip fracture.  EXAM: LEFT FEMUR - 2 VIEW  COMPARISON:  None.  FINDINGS: There is an intertrochanteric fracture of the left femur with varus angulation. No dislocation. Bones appear demineralized. Left obturator ring appears grossly intact. Incidental imaging of the left knee reveals mild patellofemoral osteophytosis.  IMPRESSION: Left intertrochanteric femur fracture.  Osteopenia.   Electronically Signed   By: Leanna Battles M.D.   On: 05/21/2014 11:57   Dg Pelvis Portable  05/22/2014   CLINICAL DATA:  Postop.  EXAM: PORTABLE PELVIS 1-2 VIEWS  COMPARISON:  05/21/2014.  FINDINGS: Status post ORIF of a left femur intertrochanteric fracture. Mildly impacted appearance of the medial fracture compared with intraoperative fluoroscopy is likely related to rotation of the left hip, although there could of also been some mild settling. Dynamic hip screw and intra medullary nail have an unremarkable appearance. The hip is located. No acute bony pelvis findings.  IMPRESSION: Intertrochanteric left femur fracture status post ORIF. No adverse findings.   Electronically Signed   By: Tiburcio Pea M.D.   On: 05/22/2014 03:00   Nm Pulmonary Perf And Vent  05/30/2014   CLINICAL DATA:  Shortness of Breath  EXAM: NUCLEAR MEDICINE VENTILATION -  PERFUSION LUNG SCAN  Views: Anterior, posterior, left lateral, right lateral, RPO, LPO, RAO, LAO -ventilation and perfusion  Radionuclide: Technetium 44m DTPA -ventilation; Technetium 77m macroaggregated albumin-perfusion  Dose:  6.0 mCi-ventilation; 40.0 mCi- perfusion  Route of administration: Inhalation-ventilation; intravenous- perfusion  COMPARISON:  Chest radiograph May 30, 2014  FINDINGS: Ventilation: Radiotracer uptake bilaterally is essentially homogeneous and symmetric  bilaterally. No focal ventilation defects are appreciable.  Perfusion: Radiotracer uptake bilaterally is essentially homogeneous and symmetric bilaterally. No focal perfusion defects are identified.  There is no appreciable ventilation/perfusion mismatch.  IMPRESSION: No ventilation or perfusion defects are appreciable. This study constitutes a very low probability of pulmonary embolus.   Electronically Signed   By: Bretta Bang M.D.   On: 05/30/2014 16:50   Dg Chest Port 1 View  05/30/2014   CLINICAL DATA:  Shortness of breath, stroke, atrial fibrillation  EXAM: PORTABLE CHEST - 1 VIEW  COMPARISON:  Portable exam 1018 hr compared to 05/21/2014  FINDINGS: Rotated to the RIGHT.  Enlargement of cardiac silhouette with pulmonary vascular congestion.  Mediastinal contours normal.  Bibasilar atelectasis versus infiltrate  No pleural effusion or pneumothorax.  Minimal central peribronchial thickening.  No acute osseous findings.  IMPRESSION: Enlargement of cardiac silhouette with pulmonary vascular congestion.  Minimal bronchitic changes with bibasilar atelectasis versus infiltrate.   Electronically Signed   By: Ulyses Southward M.D.   On: 05/30/2014 10:31   Dg C-arm 1-60 Min  05/22/2014   CLINICAL DATA:  Left hip ORIF  EXAM: DG C-ARM 61-120 MIN; LEFT FEMUR - 2 VIEW  : COMPARISON:  05/21/2014  FINDINGS: Status post ORIF of an intertrochanteric left femur fracture. Fracture alignment is excellent. No evidence of hardware complication,  including the periprosthetic fracture.  IMPRESSION: Intertrochanteric left femur fracture status post ORIF. No adverse findings.   Electronically Signed   By: Tiburcio Pea M.D.   On: 05/22/2014 02:36   ASSESSMENT AND PLAN:  1. Atrial fibrillation, persistent and rate controlled: Continue home dose of beta blocker and titrate for rate control. He is on coumadin. INR is 2.8 today. Digoxin stopped with renal insufficiency.  2. Acute renal insufficiency: Per primary team. Possible obstructive uropathy. Hematuria after initial clear urine stream per nursing after foley placed. Creatinine improved back to baseline 3. Dyspnea with unilateral LE edema: High risk for PE with recent hip fracture and immobility.No evidence of PE by VQ scan 4. Hyperkalemia: resolved 5. Acute diastolic CHF diuresing well on IV Lasix and changed to PO yesterday. He is net 8.6L neg. Needs daily weights. Check 2D echo to assess LVF. His weight 2 weeks ago in office with me was 171lbs and was 184lbs today so fairly volume overloaded as noted with increased BNP. Continue PO lasix.    Quintella Reichert, MD  06/01/2014  9:37 AM

## 2014-06-01 NOTE — Consult Note (Signed)
Urology Consult   Physician requesting consult: Dr. Landis Gandy, Internal Medicine  Reason for consult: Urinary retention  History of Present Illness: Erik Arnold is a 80 y.o. male with history of stroke and chronic a-fib who recently underwent left hip fixation after a fall on 05/24/14, who presented on 9/30 from his living facility with increased work of breathing, shortness of breath, confusion and lower extremity edema. He was found to be in acute renal failure with a Cr of 10.6 and K of 7.7. A foley was placed which drained >1L of frank blood, which has subsequently cleared. Urine culture on admission was negative. The primary team started flomax 0.4mg  daily. The foley was clamped today and the patient did not have the sensation to void with 300cc in his bladder.   Is a relatively poor historian but denies a history of voiding or storage urinary symptoms, hematuria, UTIs, STDs, urolithiasis, GU malignancy/trauma/surgery.  Past Medical History  Diagnosis Date  . Stroke 08/22/2012    residual left sided weakness (08/23/2012)  . TIA (transient ischemic attack)   . Carpal tunnel syndrome     diagnosed in 1997  . Chronic atrial fibrillation     Not on coumadin as he was deemed a fall risk after he fell in his cardiologists office.    Past Surgical History  Procedure Laterality Date  . Inguinal hernia repair  ~ 1992    "left" (08/23/2012)  . Cataract extraction w/ intraocular lens  implant, bilateral  ~ 2005  . Proctosigmoidoscopy      November 2001 normal screening proctocolonoscopy to the cecum   . Intramedullary (im) nail intertrochanteric Left 05/21/2014    Procedure: LEFT INTRAMEDULLARY (IM) NAIL INTERTROCHANTRIC;  Surgeon: Cammy Copa, MD;  Location: [facility] OR;  Service: Orthopedics;  Laterality: Left;    Medications:  Home meds:    Medication List    ASK your doctor about these medications       aspirin 325 MG EC tablet  Take 1 tablet (325 mg total) by mouth daily with  breakfast.     aspirin 81 MG EC tablet  Take 1 tablet (81 mg total) by mouth daily. Resume after 2 weeks, Swallow whole.     bisacodyl 5 MG EC tablet  Commonly known as:  DULCOLAX  Take 1 tablet (5 mg total) by mouth daily as needed for moderate constipation.     clopidogrel 75 MG tablet  Commonly known as:  PLAVIX  Take 1 tablet (75 mg total) by mouth daily. Resume once on ASA 81mg      digoxin 0.125 MG tablet  Commonly known as:  LANOXIN  Take 1 tablet (0.125 mg total) by mouth daily.     DSS 100 MG Caps  Take 100 mg by mouth 2 (two) times daily.     HYDROcodone-acetaminophen 5-325 MG per tablet  Commonly known as:  NORCO/VICODIN  Take 1-2 tablets by mouth every 4 (four) hours as needed for moderate pain.     iron polysaccharides 150 MG capsule  Commonly known as:  NIFEREX  Take 150 mg by mouth daily.     metoprolol tartrate 25 MG tablet  Commonly known as:  LOPRESSOR  Take 0.5 tablets (12.5 mg total) by mouth 2 (two) times daily.     polyethylene glycol packet  Commonly known as:  MIRALAX / GLYCOLAX  Take 17 g by mouth daily.     senna 8.6 MG Tabs tablet  Commonly known as:  SENOKOT  Take 1 tablet by  mouth daily.     warfarin 4 MG tablet  Commonly known as:  COUMADIN  Take 4 mg by mouth daily.        Scheduled Meds: . antiseptic oral rinse  7 mL Mouth Rinse BID  . furosemide  40 mg Oral BID  . metoprolol tartrate  12.5 mg Oral BID  . tamsulosin  0.4 mg Oral QPC breakfast  . warfarin  4 mg Oral q1800  . Warfarin - Pharmacist Dosing Inpatient   Does not apply q1800   Continuous Infusions: . sodium chloride 75 mL/hr at 06/01/14 1600   PRN Meds:.acetaminophen  Allergies: No Known Allergies  Family History  Problem Relation Age of Onset  . CVA Mother   . Pneumonia Father     Social History:  reports that he has quit smoking. His smoking use included Pipe. He has quit using smokeless tobacco. His smokeless tobacco use included Chew. He reports that he  does not drink alcohol or use illicit drugs.  ROS: Positive for generalized pain, difficulty moving his left leg, shortness of breath. Otherwise, 12 system review is negative.  Physical Exam:  Vital signs in last 24 hours: Temp:  [97.8 F (36.6 C)-98 F (36.7 C)] 98 F (36.7 C) (10/02 1500) Pulse Rate:  [73-108] 102 (10/02 1457) Resp:  [15-32] 27 (10/02 1457) BP: (90-124)/(45-73) 95/49 mmHg (10/02 1455) SpO2:  [92 %-100 %] 92 % (10/02 1457) Weight:  [83.5 kg (184 lb 1.4 oz)] 83.5 kg (184 lb 1.4 oz) (10/02 0400) Constitutional:  Alert and oriented x 2, No acute distress Cardiovascular: Regular rate and rhythm, No JVD Respiratory: Slightly increased respiratory effort, Lungs clear bilaterally GI: Abdomen is soft, nontender, nondistended, no abdominal masses Genitourinary: No CVAT. Normal male phallus, testes are descended bilaterally and non-tender and without masses, scrotum is normal in appearance without lesions or masses, perineum is normal on inspection. Foley is clear yellow.  Rectal: Normal sphincter tone, no rectal masses, prostate is non tender and without nodularity. Prostate size is estimated to be 100 cc  Laboratory Data:   Recent Labs  05/30/14 1005 05/31/14 0401 06/01/14 0315  WBC 14.7* 8.2 7.8  HGB 10.6* 9.3* 8.1*  HCT 30.6* 27.0* 24.6*  PLT 320 324 327     Recent Labs  05/30/14 1329 05/30/14 1900 05/30/14 2116 05/31/14 0401 06/01/14 0315  NA 133* 134* 139 140 142  K 6.4* 5.5* 5.9* 5.2 4.0  CL 94* 95* 98 100 106  GLUCOSE 81 87 97 91 101*  BUN 130* 111* 105* 78* 36*  CALCIUM 9.5 9.0 9.0 8.8 7.8*  CREATININE 8.44* 6.11* 5.47* 3.12* 0.91    Urine culture: 9/30 - no growth Urinalysis    Component Value Date/Time   COLORURINE YELLOW 05/30/2014 1134   APPEARANCEUR CLEAR 05/30/2014 1134   LABSPEC 1.008 05/30/2014 1134   PHURINE 5.5 05/30/2014 1134   GLUCOSEU NEGATIVE 05/30/2014 1134   HGBUR MODERATE* 05/30/2014 1134   BILIRUBINUR NEGATIVE 05/30/2014 1134    KETONESUR NEGATIVE 05/30/2014 1134   PROTEINUR NEGATIVE 05/30/2014 1134   UROBILINOGEN 0.2 05/30/2014 1134   NITRITE NEGATIVE 05/30/2014 1134   LEUKOCYTESUR NEGATIVE 05/30/2014 1134     Renal Function:  Recent Labs  05/30/14 1005 05/30/14 1329 05/30/14 1900 05/30/14 2116 05/31/14 0401 06/01/14 0315  CREATININE 10.68* 8.44* 6.11* 5.47* 3.12* 0.91   Estimated Creatinine Clearance: 59 ml/min (by C-G formula based on Cr of 0.91).  Radiologic Imaging: No results found.  I independently reviewed the above imaging studies.  Impression/Recommendation:  13M with urinary retention likely secondary to enlarged prostate compounded by recent orthopedic surgery, pain meds, and immobility. This resulted in acute renal failure with Cr up to 10 and K>7.7, which resolved with foley placement. His Cr normalized remarkably quickly. Hematuria was likely secondary to bladder overdistention and/or prostatic bleeding which has now resolved. Likely has diminished bladder sensation.   1. Keep foley in place for at least 7-10 days for decompression. Patient will need a trial of void as an outpatient with foley removal 2. Recommend starting finasteride 5mg  daily if not contraindicated. This will decrease the size of his prostate over the course of the next 3 months and reduce the risk of urinary retention, catheterization, and bladder outlet procedures. 3. Given his rapid normalization of renal function, do not suspect upper tract obstruction and do not think renal ultrasound is of any clinical value. Outpatient cystoscopy and/or urodynamics may be of value as an outpatient after he recovered from his hip surgery and this acute hospitalization. Bladder outlet procedure in this patient would likely not be worth the risk given his baseline health status and plavix use.

## 2014-06-02 LAB — BASIC METABOLIC PANEL
Anion gap: 9 (ref 5–15)
BUN: 22 mg/dL (ref 6–23)
CHLORIDE: 107 meq/L (ref 96–112)
CO2: 24 meq/L (ref 19–32)
Calcium: 7.8 mg/dL — ABNORMAL LOW (ref 8.4–10.5)
Creatinine, Ser: 0.71 mg/dL (ref 0.50–1.35)
GFR calc Af Amer: >90 mL/min (ref >90)
GFR calc non Af Amer: 81 mL/min — ABNORMAL LOW (ref >90)
GLUCOSE: 105 mg/dL — AB (ref 70–99)
POTASSIUM: 4 meq/L (ref 3.7–5.3)
Sodium: 140 mEq/L (ref 137–147)

## 2014-06-02 LAB — PROTIME-INR
INR: 3.42 — ABNORMAL HIGH (ref 0.00–1.49)
Prothrombin Time: 34.5 seconds — ABNORMAL HIGH (ref 11.6–15.2)

## 2014-06-02 LAB — GLUCOSE, CAPILLARY: Glucose-Capillary: 100 mg/dL — ABNORMAL HIGH (ref 70–99)

## 2014-06-02 LAB — MAGNESIUM: MAGNESIUM: 1.8 mg/dL (ref 1.5–2.5)

## 2014-06-02 NOTE — Progress Notes (Signed)
Report called pt transferring via bed with belongings to 3E10

## 2014-06-02 NOTE — Progress Notes (Signed)
Patient ID: Jian Hodgman, male   DOB: November 05, 1923, 80 y.o.   MRN: [id number]      Subjective:    Denies any SOB. No chest pain or palpitations.   Objective:   Temp:  [97.3 F (36.3 C)-98.2 F (36.8 C)] 97.7 F (36.5 C) (10/03 0806) Pulse Rate:  [53-102] 53 (10/03 0806) Resp:  [16-32] 19 (10/03 0806) BP: (94-112)/(36-56) 94/36 mmHg (10/03 0806) SpO2:  [92 %-100 %] 96 % (10/03 0806) Weight:  [186 lb 8.2 oz (84.6 kg)] 186 lb 8.2 oz (84.6 kg) (10/03 0323) Last BM Date: 05/31/14  Filed Weights   05/31/14 1700 06/01/14 0400 06/02/14 0323  Weight: 177 lb 7.5 oz (80.5 kg) 184 lb 1.4 oz (83.5 kg) 186 lb 8.2 oz (84.6 kg)    Intake/Output Summary (Last 24 hours) at 06/02/14 1048 Last data filed at 06/02/14 0900  Gross per 24 hour  Intake   2545 ml  Output   1400 ml  Net   1145 ml    Telemetry: afib, some low rates mainly in early AM to 40s  Exam:  General: NAD  Resp: faint crackles bilateral bases  Cardiac: irreg, no m/r/g, no JVD  GI: abdomen soft, NT, ND  MSK: no LE edema  Neuro: no focal deficits  Psych: appropriate affect  Lab Results:  Basic Metabolic Panel:  Recent Labs Lab 05/30/14 1005 05/30/14 1317  05/30/14 2340 05/31/14 0401 06/01/14 0315 06/02/14 0339  NA 128*  --   < >  --  140 142 140  K >7.7*  --   < >  --  5.2 4.0 4.0  CL 92*  --   < >  --  100 106 107  CO2 11*  --   < >  --  26 26 24   GLUCOSE 110*  --   < >  --  91 101* 105*  BUN 140*  --   < >  --  78* 36* 22  CREATININE 10.68*  --   < >  --  3.12* 0.91 0.71  CALCIUM 8.4  --   < >  --  8.8 7.8* 7.8*  MG  --  2.9*  --  2.4  --   --   --   < > = values in this interval not displayed.  Liver Function Tests:  Recent Labs Lab 05/30/14 1329 05/30/14 1900 05/30/14 2116 05/31/14 0401  AST 18  --  17 16  ALT 11  --  10 10  ALKPHOS 85  --  78 73  BILITOT 0.4  --  0.5 0.7  PROT 6.6  --  6.2 5.8*  ALBUMIN 2.8* 2.7* 2.6* 2.6*    CBC:  Recent Labs Lab 05/30/14 1005 05/31/14 0401  06/01/14 0315  WBC 14.7* 8.2 7.8  HGB 10.6* 9.3* 8.1*  HCT 30.6* 27.0* 24.6*  MCV 108.5* 105.5* 111.8*  PLT 320 324 327    Cardiac Enzymes:  Recent Labs Lab 05/30/14 1317 05/30/14 1917 05/31/14 0141  TROPONINI <0.30 <0.30 <0.30    BNP:  Recent Labs  05/30/14 1005  PROBNP 3632.0*    Coagulation:  Recent Labs Lab 05/31/14 0401 06/01/14 0315 06/02/14 0339  INR 2.63* 2.42* 3.42*    ECG:   Medications:   Scheduled Medications: . antiseptic oral rinse  7 mL Mouth Rinse BID  . finasteride  5 mg Oral Daily  . furosemide  40 mg Oral BID  . metoprolol tartrate  12.5 mg Oral BID  . tamsulosin  0.4 mg Oral QPC breakfast  . Warfarin - Pharmacist Dosing Inpatient   Does not apply q1800     Infusions: . sodium chloride 75 mL/hr at 06/01/14 2125     PRN Medications:  acetaminophen  06/01/14 Echo Study Conclusions  - Left ventricle: The cavity size was normal. Wall thickness was normal. Systolic function was normal. The estimated ejection fraction was in the range of 60% to 65%. - Aortic valve: AV is thickened, calcified with minimally restricted motion. Peak and mean gradients through the valve are 18 and 10 mm Hg respectively. There was mild regurgitation. - Mitral valve: There was mild regurgitation. - Left atrium: The atrium was severely dilated. - Right atrium: The atrium was mildly dilated. - Pulmonary arteries: PA peak pressure: 42 mm Hg (S).     Assessment/Plan    1. Acute diastolic HF - echo 06/01/14 LVEF 60-65%, mild AS, diastolic function not described. E/e' 20 consistent with elevated LA pressure.  - + 355mL yesterday, net negative 7.2 liters since admission. He is on lasix oral 40mg  bid. Changed to oral lasix yesterday. Renal function improving with diuresis and resolution of urinary obstruction. - appears near euvolemic, agree with oral diuretics at this time. Noted he is on NS at 75 mL/hr, defer to primary team but from cardiac standpoint  would consider stopping.   2. Afib - rate control with lopressor, on coumadin for stroke prevention with supratherapeutic INR. Dosing per pharmacy.  - dig held in setting of AKI, follow rates. Fairly soft blood pressure, cannot increase lopressor at this time. Follow rates prior to deciding on restarting digoxin - some low rates at times, mainly in early AM hours. Will monitor for now, continue current dose of lopressor.   3. Obstructive upropathy with AKI - management per urology  Dina RichJonathan Makayleigh Poliquin, M.D.

## 2014-06-02 NOTE — Progress Notes (Signed)
Received to unit via bed.  IV site unremarkable.  Neurovascular checks WNL.  Alert and cooperative.  K-pad to left hip as ordered.  Repositioned for comfort.

## 2014-06-02 NOTE — Progress Notes (Signed)
No bipap in room.  Bipap was older oreder. Rt will continue to monitor.

## 2014-06-02 NOTE — Progress Notes (Signed)
ANTICOAGULATION CONSULT NOTE - Follow Up Consult  Pharmacy Consult for coumadin Indication: atrial fibrillation  No Known Allergies  Patient Measurements: Height: 5\' 8"  (172.7 cm) Weight: 186 lb 8.2 oz (84.6 kg) IBW/kg (Calculated) : 68.4  Vital Signs: Temp: 97.7 F (36.5 C) (10/03 0806) Temp Source: Axillary (10/03 0806) BP: 94/36 mmHg (10/03 0806) Pulse Rate: 53 (10/03 0806)  Labs:  Recent Labs  05/30/14 1005 05/30/14 1317  05/30/14 1917  05/31/14 0141 05/31/14 0401 06/01/14 0315 06/02/14 0339  HGB 10.6*  --   --   --   --   --  9.3* 8.1*  --   HCT 30.6*  --   --   --   --   --  27.0* 24.6*  --   PLT 320  --   --   --   --   --  324 327  --   LABPROT 29.5*  --   --   --   --   --  28.1* 26.3* 34.5*  INR 2.80*  --   --   --   --   --  2.63* 2.42* 3.42*  CREATININE 10.68*  --   < >  --   < >  --  3.12* 0.91 0.71  TROPONINI  --  <0.30  --  <0.30  --  <0.30  --   --   --   < > = values in this interval not displayed.  Estimated Creatinine Clearance: 67.6 ml/min (by C-G formula based on Cr of 0.71).   Medications:  Scheduled:  . antiseptic oral rinse  7 mL Mouth Rinse BID  . finasteride  5 mg Oral Daily  . furosemide  40 mg Oral BID  . metoprolol tartrate  12.5 mg Oral BID  . tamsulosin  0.4 mg Oral QPC breakfast  . Warfarin - Pharmacist Dosing Inpatient   Does not apply q1800    Assessment: 80 yo m admitted on 9/30 with SOB and LE edema.  Patient is on coumadin PTA for afib at a dose of 4 mg PO daily. INR yesterday was therapeutic at 2.42 and her normal 4 mg dose was given last night. INR this AM is supratherapeutic at 3.42.  Will hold tonight's dose and check INR in AM to determine additional dosing.    Goal of Therapy:  INR 2-3 Monitor platelets by anticoagulation protocol: Yes   Plan:  Hold coumadin tonight INR in AM to determine additional dosing Monitor CBC, INR, s/s of bleeding, clinical course  Cassie L. Roseanne RenoStewart, PharmD Clinical Pharmacy  Resident Pager: [phone] 06/02/2014 9:39 AM

## 2014-06-02 NOTE — Evaluation (Signed)
Physical Therapy Evaluation Patient Details Name: Erik Arnold MRN: [id number] DOB: 05/25/1924 Today's Date: 06/02/2014   History of Present Illness  80 y/o, h/o recent admit 9/21-9/24/15 for L hip fracture after fall, resluting in pinning by Dr. August Saucerean on 05/21/14. Pt with Afib (not on coumadin due to falls), prior TIA 07/2012 with mild residual left sided weakness, re-presented from [facility] c ? wob/sob and LE edema. Pt with acute hypoxic respiratory failure as well as bilateral obstructive uropathy.  Clinical Impression  Pt admitted with respiratory distress and obstructive uropathy in setting of recent decreased mobility from left hip fx. Pt currently with functional limitations due to the deficits listed below (see PT Problem List). Pt transferred bed to chair with +2 mod A and performed hip fx exercises. Pt reports that current WB status is 45%, would be helpful to have clarification of this from ortho. Pt will benefit from skilled PT to increase their independence and safety with mobility to allow discharge to the venue listed below. PT will continue to follow.       Follow Up Recommendations SNF    Equipment Recommendations  None recommended by PT    Recommendations for Other Services       Precautions / Restrictions Precautions Precautions: Fall Restrictions Weight Bearing Restrictions: No LLE Weight Bearing: Partial weight bearing LLE Partial Weight Bearing Percentage or Pounds: 45% per pt report right before he left [facility].       Mobility  Bed Mobility Overal bed mobility: Needs Assistance Bed Mobility: Supine to Sit     Supine to sit: +2 for physical assistance;Mod assist     General bed mobility comments: mod A for mvmt of LLE and elevation of trunk  Transfers Overall transfer level: Needs assistance Equipment used: 2 person hand held assist Transfers: Sit to/from UGI CorporationStand;Stand Pivot Transfers Sit to Stand: Mod assist;+2 physical assistance Stand pivot transfers: Mod  assist;+2 physical assistance       General transfer comment: vc's to minimize WB'ing, difficulty advancing LLE, Mod A for wt-shifting to slide feet  Ambulation/Gait             General Gait Details: unable  Stairs            Wheelchair Mobility    Modified Rankin (Stroke Patients Only)       Balance Overall balance assessment: Needs assistance Sitting-balance support: Bilateral upper extremity supported;Feet supported Sitting balance-Leahy Scale: Fair     Standing balance support: Bilateral upper extremity supported;During functional activity Standing balance-Leahy Scale: Poor                               Pertinent Vitals/Pain Pain Assessment: Faces Faces Pain Scale: Hurts little more Pain Location: left hip with mvmt Pain Descriptors / Indicators: Aching Pain Intervention(s): Repositioned;Monitored during session    Home Living Family/patient expects to be discharged to:: Skilled nursing facility Living Arrangements: Alone Available Help at Discharge: Skilled Nursing Facility Type of Home: House Home Access: Stairs to enter Entrance Stairs-Rails: Doctor, general practiceight;Left Entrance Stairs-Number of Steps: 2 Home Layout: One level Home Equipment: Environmental consultantWalker - 2 wheels;Cane - single point;Shower seat Additional Comments: plans to return to [facility] to continue with rehab. He lived alone prior, has a sister that checks on him    Prior Function Level of Independence: Needs assistance   Gait / Transfers Assistance Needed: reports that at [facility] he had not progressed to ambulation but had been transferring and standing in  parallel bars with assistance  ADL's / Homemaking Assistance Needed: needs help with bathing and dressing  Comments: used cane for ambulation prior to hip fx and was independent     Hand Dominance   Dominant Hand: Right    Extremity/Trunk Assessment   Upper Extremity Assessment: Defer to OT evaluation           Lower Extremity  Assessment: LLE deficits/detail;RLE deficits/detail RLE Deficits / Details: generalized weakness noted RLE, grossly 3/5 LLE Deficits / Details: unable to lift LLE against gravity, hip flex 1/5, knee ext 2+/5, otherwise grossly 2/5  Cervical / Trunk Assessment: Kyphotic  Communication   Communication: No difficulties  Cognition Arousal/Alertness: Awake/alert Behavior During Therapy: WFL for tasks assessed/performed Overall Cognitive Status: History of cognitive impairments - at baseline       Memory: Decreased short-term memory              General Comments      Exercises General Exercises - Lower Extremity Ankle Circles/Pumps: AROM;Both;10 reps;Seated Quad Sets: Both;10 reps;Seated;Strengthening Gluteal Sets: Strengthening;Both;Seated;10 reps Long Arc Quad: AAROM;Left;10 reps;Seated Hip ABduction/ADduction: AAROM;Left;10 reps;Seated Straight Leg Raises: AAROM;Left;10 reps;Seated      Assessment/Plan    PT Assessment Patient needs continued PT services  PT Diagnosis Difficulty walking;Abnormality of gait;Generalized weakness;Acute pain   PT Problem List Decreased strength;Decreased range of motion;Decreased activity tolerance;Decreased balance;Decreased mobility;Decreased coordination;Decreased safety awareness;Decreased knowledge of precautions;Cardiopulmonary status limiting activity;Pain  PT Treatment Interventions DME instruction;Gait training;Functional mobility training;Therapeutic activities;Therapeutic exercise;Balance training;Neuromuscular re-education;Patient/family education;Modalities   PT Goals (Current goals can be found in the Care Plan section) Acute Rehab PT Goals Patient Stated Goal: Get better PT Goal Formulation: With patient Time For Goal Achievement: 06/16/14 Potential to Achieve Goals: Fair    Frequency Min 3X/week   Barriers to discharge        Co-evaluation               End of Session Equipment Utilized During Treatment: Gait  belt Activity Tolerance: Patient tolerated treatment well Patient left: in chair;with call bell/phone within reach Nurse Communication: Mobility status         Time: 1407-1430 PT Time Calculation (min): 23 min   Charges:   PT Evaluation $Initial PT Evaluation Tier I: 1 Procedure PT Treatments $Therapeutic Activity: 8-22 mins   PT G Codes:        Lyanne Co, PT  Acute Rehab Services  [phone]   Lyanne Co 06/02/2014, 3:23 PM

## 2014-06-02 NOTE — Progress Notes (Signed)
Note: This document was prepared with digital dictation and possible smart phrase technology. Any transcriptional errors that result from this process are unintentional.   Erik HoppingOren Arnold [id number] DOB: 12/09/1974 DOA: 05/30/2014 PCP: Quintella ReichertURNER,TRACI R, MD  Brief narrative: 80 y/o ?, h/o recent admit 9/21-9/24/15 for L hip #, Afib CHad2Vasc2 score~4 not on coumadin 2/2 to falls, prior TIA 07/2012, re-presented from [facility] ? wob/sob and LE edema. VQ scan was negative   Found to have acute kidney injury and hyperkalemia and Afib c RVR. It was thought that his renal failure was secondary to obstructive uropathy and a Foley was placed and he immediately passed urine. This was dark colored initially. Cardiology was consulted to help with management of his A. fib  Past medical history-As per Problem list Chart reviewed as below- reviewed  Consultants:   Nephrology  Cardiology  Procedures:   none  Antibiotics:  none   Subjective  Doing fair Just woke up No nausea no vomiting no other issues   Objective    Interim History: Nursing reports some pauses on telemetry  15 beat of V. tach   Telemetry: Atrial fibrillation   Objective: Filed Vitals:   06/02/14 0320 06/02/14 0323 06/02/14 0610 06/02/14 0806  BP: 102/56 102/56 98/46 94/36   Pulse: 88 86 65 53  Temp:  97.3 F (36.3 C)  97.7 F (36.5 C)  TempSrc:  Oral  Axillary  Resp: 28 26 28 19   Height:      Weight:  84.6 kg (186 lb 8.2 oz)    SpO2: 96% 99% 97% 96%    Intake/Output Summary (Last 24 hours) at 06/02/14 0905 Last data filed at 06/02/14 [id number]  Gross per 24 hour  Intake   2155 ml  Output   1400 ml  Net    755 ml    Exam:  General: EOMI NCAT Cardiovascular: S1-S2 no murmur rub or gallop Respiratory: Clinically clear no added sounds or rhonchi Abdomen: Soft nontender nondistended no rebound Skin multiple seborrheic keratoses Neuro intact. No real problems equally.  Data Reviewed: Basic Metabolic  Panel:  Recent Labs Lab 05/30/14 1005 05/30/14 1317 05/30/14 1329 05/30/14 1900 05/30/14 2116 05/30/14 2340 05/31/14 0401 06/01/14 0315 06/02/14 0339  NA 128*  --  133* 134* 139  --  140 142 140  K >7.7*  --  6.4* 5.5* 5.9*  --  5.2 4.0 4.0  CL 92*  --  94* 95* 98  --  100 106 107  CO2 11*  --  17* 18* 22  --  26 26 24   GLUCOSE 110*  --  81 87 97  --  91 101* 105*  BUN 140*  --  130* 111* 105*  --  78* 36* 22  CREATININE 10.68*  --  8.44* 6.11* 5.47*  --  3.12* 0.91 0.71  CALCIUM 8.4  --  9.5 9.0 9.0  --  8.8 7.8* 7.8*  MG  --  2.9*  --   --   --  2.4  --   --   --   PHOS  --   --  7.1* 5.9*  --   --   --   --   --    Liver Function Tests:  Recent Labs Lab 05/30/14 1317 05/30/14 1329 05/30/14 1900 05/30/14 2116 05/31/14 0401  AST 19 18  --  17 16  ALT 11 11  --  10 10  ALKPHOS 88 85  --  78 73  BILITOT 0.4 0.4  --  0.5 0.7  PROT 6.7 6.6  --  6.2 5.8*  ALBUMIN 2.8* 2.8* 2.7* 2.6* 2.6*   No results found for this basename: LIPASE, AMYLASE,  in the last 168 hours No results found for this basename: AMMONIA,  in the last 168 hours CBC:  Recent Labs Lab 05/30/14 1005 05/31/14 0401 06/01/14 0315  WBC 14.7* 8.2 7.8  NEUTROABS 10.6* 6.0 5.3  HGB 10.6* 9.3* 8.1*  HCT 30.6* 27.0* 24.6*  MCV 108.5* 105.5* 111.8*  PLT 320 324 327   Cardiac Enzymes:  Recent Labs Lab 05/30/14 1317 05/30/14 1917 05/31/14 0141  TROPONINI <0.30 <0.30 <0.30   BNP: No components found with this basename: POCBNP,  CBG:  Recent Labs Lab 05/31/14 0739 06/01/14 0741 06/02/14 0805  GLUCAP 107* 106* 100*    Recent Results (from the past 240 hour(s))  URINE CULTURE     Status: None   Collection Time    05/30/14 11:34 AM      Result Value Ref Range Status   Specimen Description URINE, CATHETERIZED   Final   Special Requests NONE   Final   Culture  Setup Time     Final   Value: 05/30/2014 18:15     Performed at [facility] Count     Final   Value: NO  GROWTH     Performed at [facility]   Culture     Final   Value: NO GROWTH     Performed at [facility]   Report Status 05/31/2014 FINAL   Final     Studies:              All Imaging reviewed and is as per above notation   Scheduled Meds: . antiseptic oral rinse  7 mL Mouth Rinse BID  . finasteride  5 mg Oral Daily  . furosemide  40 mg Oral BID  . metoprolol tartrate  12.5 mg Oral BID  . tamsulosin  0.4 mg Oral QPC breakfast  . warfarin  4 mg Oral q1800  . Warfarin - Pharmacist Dosing Inpatient   Does not apply q1800   Continuous Infusions: . sodium chloride 75 mL/hr at 06/01/14 2125     Assessment/Plan:  1. Acute obstructive uropathy-creatinine trending down well with Foley placement. Start Flomax 0.4 mg daily. No sensation to void therefore a urology consulted and added finasteride to his management. Will need outpatient urology care 2. Acute kidney injury resolving as above. Saline lock IV fluids 10/2.  I have discontinued Lasix 10/2 as well and placed him on by mouth Lasix 40 twice a day 3. Chronic atrial fibrillation with pauses and V. tach-continue Toprol 12.5 twice a day. Hold digoxin given renal function. May not be a candidate for this long-term-defer to cardiology. Echocardiogram 10/2 EF 6065% -obtain magnesium level 10/3 4. Bradycardia-section secondary to metoprolol. Defer to cardiology. 2.2 second was noted overnight in addition to 15 beats of V. tach 5. Catheter-induced hematuria-resolved-urine stream is clear 6. Anemia of blood loss from hematuria-trasnfuse if below 7 7. Acute hypoxic respiratory failure secondary to pulmonary edema. VQ scan was negative  8. Metabolic acidosis-resolved. 9. Hyperkalemia-possibly a result of metabolic acidosis and renal insufficiency. Potassium of 4.0 10. Recent hip fracture-place on Coumadin per pharmacy consult. Monitor.  INR 3.4 11. Hypervolemic hyponatremia-resolved 12. Prior TIA-aspirin as well as Plavix on  hold. May resume after Coumadin course of one month subsequently-differplanning to cardiology  Code Status: DO NOT RESUSCITATE Family Communication: called sister  10/3. Disposition Plan: inpatient SDU-likely can transfer to telemetry bed later today.  Potential transfer and discharge to SNF in 1/05   Pleas Koch, MD  [facility] Pager [phone] 06/02/2014, 9:05 AM    LOS: 3 days

## 2014-06-02 NOTE — Progress Notes (Signed)
Pt had 15 beat run of vtach. Pt is sleeping well w/o any s/s of distress. Midlevel paged awaiting call back.

## 2014-06-03 DIAGNOSIS — N139 Obstructive and reflux uropathy, unspecified: Secondary | ICD-10-CM

## 2014-06-03 LAB — COMPREHENSIVE METABOLIC PANEL
ALBUMIN: 2.4 g/dL — AB (ref 3.5–5.2)
ALK PHOS: 73 U/L (ref 39–117)
ALT: 12 U/L (ref 0–53)
AST: 17 U/L (ref 0–37)
Anion gap: 8 (ref 5–15)
BUN: 14 mg/dL (ref 6–23)
CO2: 25 meq/L (ref 19–32)
Calcium: 8.1 mg/dL — ABNORMAL LOW (ref 8.4–10.5)
Chloride: 106 mEq/L (ref 96–112)
Creatinine, Ser: 0.69 mg/dL (ref 0.50–1.35)
GFR calc Af Amer: >90 mL/min (ref >90)
GFR calc non Af Amer: 82 mL/min — ABNORMAL LOW (ref >90)
GLUCOSE: 101 mg/dL — AB (ref 70–99)
POTASSIUM: 3.8 meq/L (ref 3.7–5.3)
Sodium: 139 mEq/L (ref 137–147)
Total Bilirubin: 0.5 mg/dL (ref 0.3–1.2)
Total Protein: 5.4 g/dL — ABNORMAL LOW (ref 6.0–8.3)

## 2014-06-03 LAB — CBC
HCT: 25.2 % — ABNORMAL LOW (ref 39.0–52.0)
Hemoglobin: 8.5 g/dL — ABNORMAL LOW (ref 13.0–17.0)
MCH: 36.6 pg — ABNORMAL HIGH (ref 26.0–34.0)
MCHC: 33.7 g/dL (ref 30.0–36.0)
MCV: 108.6 fL — ABNORMAL HIGH (ref 78.0–100.0)
Platelets: 423 10*3/uL — ABNORMAL HIGH (ref 150–400)
RBC: 2.32 MIL/uL — ABNORMAL LOW (ref 4.22–5.81)
RDW: 15 % (ref 11.5–15.5)
WBC: 11.3 10*3/uL — ABNORMAL HIGH (ref 4.0–10.5)

## 2014-06-03 LAB — PROTIME-INR
INR: 3.34 — AB (ref 0.00–1.49)
PROTHROMBIN TIME: 33.9 s — AB (ref 11.6–15.2)

## 2014-06-03 LAB — GLUCOSE, CAPILLARY: Glucose-Capillary: 94 mg/dL (ref 70–99)

## 2014-06-03 MED ORDER — ACETAMINOPHEN 500 MG PO TABS
500.0000 mg | ORAL_TABLET | Freq: Four times a day (QID) | ORAL | Status: DC
  Administered 2014-06-03 – 2014-06-04 (×3): 500 mg via ORAL
  Filled 2014-06-03 (×6): qty 1

## 2014-06-03 MED ORDER — HYDROCODONE-ACETAMINOPHEN 5-325 MG PO TABS
1.0000 | ORAL_TABLET | Freq: Every evening | ORAL | Status: DC | PRN
  Administered 2014-06-03: 1 via ORAL
  Filled 2014-06-03: qty 1

## 2014-06-03 MED ORDER — FUROSEMIDE 40 MG PO TABS
40.0000 mg | ORAL_TABLET | Freq: Every day | ORAL | Status: DC
  Administered 2014-06-04: 40 mg via ORAL
  Filled 2014-06-03: qty 1

## 2014-06-03 NOTE — Progress Notes (Addendum)
ANTICOAGULATION CONSULT NOTE - Follow Up Consult  Pharmacy Consult for Coumadin Indication: atrial fibrillation  No Known Allergies  Labs:  Recent Labs  06/01/14 0315 06/02/14 0339 06/03/14 0810  HGB 8.1*  --  8.5*  HCT 24.6*  --  25.2*  PLT 327  --  423*  LABPROT 26.3* 34.5* 33.9*  INR 2.42* 3.42* 3.34*  CREATININE 0.91 0.71 0.69    Estimated Creatinine Clearance: 67.3 ml/min (by C-G formula based on Cr of 0.69).   Assessment: 80 yo m admitted on 9/30 with SOB and LE edema.  Patient is on coumadin PTA for afib at a dose of 4 mg PO daily with a therapeutic INR on admission.  INR now supra-therapeutic at 3.34 (trending down from 3.42)  Goal of Therapy:  INR 2-3 Monitor platelets by anticoagulation protocol: Yes   Plan:  Hold coumadin tonight INR in AM to determine additional dosing Monitor CBC, INR, s/s of bleeding, clinical course  Thank you. Okey Regal, PharmD [phone]  06/03/2014 11:38 AM

## 2014-06-03 NOTE — Progress Notes (Signed)
Note: This document was prepared with digital dictation and possible smart phrase technology. Any transcriptional errors that result from this process are unintentional.   Erik HoppingOren Loden [id number] DOB: 07/04/1924 DOA: 05/30/2014 PCP: Quintella ReichertURNER,TRACI R, MD  Brief narrative: 80 y/o ?, h/o recent admit 9/21-9/24/15 for L hip #, Afib CHad2Vasc2 score~4 not on coumadin 2/2 to falls, prior TIA 07/2012, re-presented from [facility] ? wob/sob and LE edema. VQ scan was negative   Found to have acute kidney injury and hyperkalemia and Afib c RVR. It was thought that his renal failure was secondary to obstructive uropathy and a Foley was placed and he immediately passed urine. This was dark colored initially. Cardiology was consulted to help with management of his A. fib  Past medical history-As per Problem list Chart reviewed as below- reviewed  Consultants:   Nephrology  Cardiology  Procedures:   none  Antibiotics:  none   Subjective   Doing well, maintained is left lower extremity pain No nausea no vomiting About 2 have lunch No blurred vision or double vision   Objective    Interim History: Nursing reports some pauses on telemetry  15 beat of V. tach   Telemetry: Atrial fibrillation   Objective: Filed Vitals:   06/02/14 1200 06/02/14 1417 06/02/14 2024 06/03/14 0514  BP: 84/56 95/47 116/52 134/47  Pulse: 68 63 86 80  Temp: 98.1 F (36.7 C) 98.1 F (36.7 C) 97.9 F (36.6 C) 98.1 F (36.7 C)  TempSrc: Oral Oral Oral Oral  Resp:   16 18  Height:      Weight:    83.553 kg (184 lb 3.2 oz)  SpO2: 96% 97% 99% 99%    Intake/Output Summary (Last 24 hours) at 06/03/14 1228 Last data filed at 06/03/14 0931  Gross per 24 hour  Intake    600 ml  Output   3500 ml  Net  -2900 ml    Exam:  General: EOMI NCAT Cardiovascular: S1-S2 no murmur rub or gallop Respiratory: Clinically clear no added sounds or rhonchi Abdomen: Soft nontender nondistended no rebound Skin  multiple seborrheic keratoses Neuro intact. No real problems equally.  Data Reviewed: Basic Metabolic Panel:  Recent Labs Lab 05/30/14 1005 05/30/14 1317 05/30/14 1329 05/30/14 1900 05/30/14 2116 05/30/14 2340 05/31/14 0401 06/01/14 0315 06/02/14 0339 06/02/14 1040 06/03/14 0810  NA 128*  --  133* 134* 139  --  140 142 140  --  139  K >7.7*  --  6.4* 5.5* 5.9*  --  5.2 4.0 4.0  --  3.8  CL 92*  --  94* 95* 98  --  100 106 107  --  106  CO2 11*  --  17* 18* 22  --  26 26 24   --  25  GLUCOSE 110*  --  81 87 97  --  91 101* 105*  --  101*  BUN 140*  --  130* 111* 105*  --  78* 36* 22  --  14  CREATININE 10.68*  --  8.44* 6.11* 5.47*  --  3.12* 0.91 0.71  --  0.69  CALCIUM 8.4  --  9.5 9.0 9.0  --  8.8 7.8* 7.8*  --  8.1*  MG  --  2.9*  --   --   --  2.4  --   --   --  1.8  --   PHOS  --   --  7.1* 5.9*  --   --   --   --   --   --   --  Liver Function Tests:  Recent Labs Lab 05/30/14 1317 05/30/14 1329 05/30/14 1900 05/30/14 2116 05/31/14 0401 06/03/14 0810  AST 19 18  --  17 16 17   ALT 11 11  --  10 10 12   ALKPHOS 88 85  --  78 73 73  BILITOT 0.4 0.4  --  0.5 0.7 0.5  PROT 6.7 6.6  --  6.2 5.8* 5.4*  ALBUMIN 2.8* 2.8* 2.7* 2.6* 2.6* 2.4*   No results found for this basename: LIPASE, AMYLASE,  in the last 168 hours No results found for this basename: AMMONIA,  in the last 168 hours CBC:  Recent Labs Lab 05/30/14 1005 05/31/14 0401 06/01/14 0315 06/03/14 0810  WBC 14.7* 8.2 7.8 11.3*  NEUTROABS 10.6* 6.0 5.3  --   HGB 10.6* 9.3* 8.1* 8.5*  HCT 30.6* 27.0* 24.6* 25.2*  MCV 108.5* 105.5* 111.8* 108.6*  PLT 320 324 327 423*   Cardiac Enzymes:  Recent Labs Lab 05/30/14 1317 05/30/14 1917 05/31/14 0141  TROPONINI <0.30 <0.30 <0.30   BNP: No components found with this basename: POCBNP,  CBG:  Recent Labs Lab 05/31/14 0739 06/01/14 0741 06/02/14 0805 06/03/14 0617  GLUCAP 107* 106* 100* 94    Recent Results (from the past 240 hour(s))    URINE CULTURE     Status: None   Collection Time    05/30/14 11:34 AM      Result Value Ref Range Status   Specimen Description URINE, CATHETERIZED   Final   Special Requests NONE   Final   Culture  Setup Time     Final   Value: 05/30/2014 18:15     Performed at [facility] Count     Final   Value: NO GROWTH     Performed at [facility]   Culture     Final   Value: NO GROWTH     Performed at [facility]   Report Status 05/31/2014 FINAL   Final     Studies:              All Imaging reviewed and is as per above notation   Scheduled Meds: . acetaminophen  500 mg Oral 4 times per day  . antiseptic oral rinse  7 mL Mouth Rinse BID  . finasteride  5 mg Oral Daily  . [START ON 06/04/2014] furosemide  40 mg Oral Daily  . metoprolol tartrate  12.5 mg Oral BID  . tamsulosin  0.4 mg Oral QPC breakfast  . Warfarin - Pharmacist Dosing Inpatient   Does not apply q1800   Continuous Infusions:     Assessment/Plan:  1. Acute obstructive uropathy-creatinine trending down well with Foley placement. Start Flomax 0.4 mg daily. No sensation to void therefore a urology consulted and added finasteride 5 mg to his management. Will need outpatient urology care 2. Acute kidney injury resolving as above. Saline lock IV fluids 10/2.  I have discontinued Lasix 10/2 as well and placed him on by mouth Lasix 40 twice a day which was adjusted to once daily 40 mg by cardiology 3. Chronic atrial fibrillation with pauses and V. tach-continue Toprol 12.5 twice a day. Hold digoxin given renal function. May not be a candidate for this long-term-defer to cardiology. Echocardiogram 10/2 EF 6065% he has some pauses and low rate [facility] continue monotherapy with metoprolol 4. Bradycardia-section secondary to metoprolol. Defer to cardiology. 2.2 second was noted overnight in addition to 15 beats of V. tach-magnesium checked in  10/3 was 1.8 5. Catheter-induced  hematuria-resolved-urine stream is clear 6. Anemia of blood loss from hematuria-trasnfuse if below 7 7. Acute hypoxic respiratory failure secondary to pulmonary edema. VQ scan was negative  8. Metabolic acidosis-resolved.  9. Hyperkalemia-possibly a result of metabolic acidosis and renal insufficiency. Potassium of 3.8 10. Recent hip fracture-place on Coumadin per pharmacy consult. Monitor.  INR 3.4. We will ask orthopedics to remove his staples before discharge. Continue Tylenol scheduled in 4-6 when necessary and Norco 1 tablet at night for pain 11. Hypervolemic hyponatremia-resolved 12. Prior TIA-aspirin as well as Plavix on hold. May resume after Coumadin course of one month subsequently-differ planning to cardiology as outpatient  Code Status: DO NOT RESUSCITATE Family Communication: None at bedside Disposition Plan: telemetry bed later today.  Potential transfer and discharge to SNF in 10/05   Pleas Koch, MD  [facility] Pager [phone] 06/03/2014, 12:28 PM    LOS: 4 days

## 2014-06-03 NOTE — Progress Notes (Signed)
Pt's HR down to 40's nonsustained, pt asymptomatic. Elray Mcgregor NP notified, no orders made at this time. Will continue to monitor.  06/03/14 0514  Vitals  Temp 98.1 F (36.7 C)  Temp Source Oral  BP ! 134/47 mmHg  BP Location Right arm  Pulse Rate 80  Pulse Rate Source Dinamap  Resp 18  Oxygen Therapy  SpO2 99 %  O2 Device None (Room air)  Height and Weight  Weight 83.553 kg (184 lb 3.2 oz)  Type of Scale Used Bed

## 2014-06-03 NOTE — Progress Notes (Signed)
Patient ID: Erik HoppingOren Arnold, male   DOB: 12/08/1923, 80 y.o.   MRN: [id number]      Subjective:    Denies any SOB. No chest pain or palpitations. -2L overnight on oral diuretics. IV normal saline stopped. Total negative about -10L.  Objective:   Temp:  [97.9 F (36.6 C)-98.1 F (36.7 C)] 98.1 F (36.7 C) (10/04 0514) Pulse Rate:  [63-86] 80 (10/04 0514) Resp:  [16-20] 18 (10/04 0514) BP: (84-134)/(42-56) 134/47 mmHg (10/04 0514) SpO2:  [96 %-100 %] 99 % (10/04 0514) Weight:  [184 lb 3.2 oz (83.553 kg)] 184 lb 3.2 oz (83.553 kg) (10/04 0514) Last BM Date: 06/03/14  Filed Weights   06/01/14 0400 06/02/14 0323 06/03/14 0514  Weight: 184 lb 1.4 oz (83.5 kg) 186 lb 8.2 oz (84.6 kg) 184 lb 3.2 oz (83.553 kg)    Intake/Output Summary (Last 24 hours) at 06/03/14 1007 Last data filed at 06/03/14 0931  Gross per 24 hour  Intake    825 ml  Output   3800 ml  Net  -2975 ml    Telemetry: afib, some low rates mainly in early AM to 40s  Exam:  General: NAD  Resp: faint crackles bilateral bases  Cardiac: irreg, no m/r/g, no JVD  GI: abdomen soft, NT, ND  MSK: no LE edema  Neuro: no focal deficits  Psych: appropriate affect  Lab Results:  Basic Metabolic Panel:  Recent Labs Lab 05/30/14 1317  05/30/14 2340  06/01/14 0315 06/02/14 0339 06/02/14 1040 06/03/14 0810  NA  --   < >  --   < > 142 140  --  139  K  --   < >  --   < > 4.0 4.0  --  3.8  CL  --   < >  --   < > 106 107  --  106  CO2  --   < >  --   < > 26 24  --  25  GLUCOSE  --   < >  --   < > 101* 105*  --  101*  BUN  --   < >  --   < > 36* 22  --  14  CREATININE  --   < >  --   < > 0.91 0.71  --  0.69  CALCIUM  --   < >  --   < > 7.8* 7.8*  --  8.1*  MG 2.9*  --  2.4  --   --   --  1.8  --   < > = values in this interval not displayed.  Liver Function Tests:  Recent Labs Lab 05/30/14 2116 05/31/14 0401 06/03/14 0810  AST 17 16 17   ALT 10 10 12   ALKPHOS 78 73 73  BILITOT 0.5 0.7 0.5  PROT 6.2 5.8*  5.4*  ALBUMIN 2.6* 2.6* 2.4*    CBC:  Recent Labs Lab 05/31/14 0401 06/01/14 0315 06/03/14 0810  WBC 8.2 7.8 11.3*  HGB 9.3* 8.1* 8.5*  HCT 27.0* 24.6* 25.2*  MCV 105.5* 111.8* 108.6*  PLT 324 327 423*    Cardiac Enzymes:  Recent Labs Lab 05/30/14 1317 05/30/14 1917 05/31/14 0141  TROPONINI <0.30 <0.30 <0.30    BNP:  Recent Labs  05/30/14 1005  PROBNP 3632.0*    Coagulation:  Recent Labs Lab 06/01/14 0315 06/02/14 0339 06/03/14 0810  INR 2.42* 3.42* 3.34*    ECG:   Medications:   Scheduled Medications: . antiseptic oral  rinse  7 mL Mouth Rinse BID  . finasteride  5 mg Oral Daily  . furosemide  40 mg Oral BID  . metoprolol tartrate  12.5 mg Oral BID  . tamsulosin  0.4 mg Oral QPC breakfast  . Warfarin - Pharmacist Dosing Inpatient   Does not apply q1800    Infusions:    PRN Medications: acetaminophen  06/01/14 Echo Study Conclusions  - Left ventricle: The cavity size was normal. Wall thickness was normal. Systolic function was normal. The estimated ejection fraction was in the range of 60% to 65%. - Aortic valve: AV is thickened, calcified with minimally restricted motion. Peak and mean gradients through the valve are 18 and 10 mm Hg respectively. There was mild regurgitation. - Mitral valve: There was mild regurgitation. - Left atrium: The atrium was severely dilated. - Right atrium: The atrium was mildly dilated. - Pulmonary arteries: PA peak pressure: 42 mm Hg (S).     Assessment/Plan    1. Acute diastolic HF - echo 06/01/14 LVEF 60-65%, mild AS, diastolic function not described. E/e' 20 consistent with elevated LA pressure.  - seems to be auto-diuresing to some degree at this point with large amount of urine output - post-obstruction relieved with catheter in - can decrease lasix to 40 mg daily.  2. Afib - rate control with lopressor, on coumadin for stroke prevention with supratherapeutic INR. Dosing per pharmacy.  - dig held  in setting of AKI, follow rates. Fairly soft blood pressure, cannot increase lopressor at this time. Follow rates prior to deciding on restarting digoxin - some low rates at times, mainly in early AM hours. Will monitor for now, continue current dose of lopressor.   3. Obstructive upropathy with AKI - management per urology  4. Leukocytosis - white blood cell count is rising. UA was recently negative and urine appears clear yellow. No fever, cough or chills. Would monitor for now.  Chrystie Nose, MD, [facility] Attending Cardiologist [facility]

## 2014-06-03 NOTE — Progress Notes (Signed)
Patient BP 113/43. HR 69.  NP notified of upcoming metoprolol due.  NP instructed to give metoprolol tonight.  Will notify NP of any changes. RN will continue to monitor. Louretta Parma, RN

## 2014-06-04 LAB — PROTIME-INR
INR: 2.44 — ABNORMAL HIGH (ref 0.00–1.49)
Prothrombin Time: 26.5 seconds — ABNORMAL HIGH (ref 11.6–15.2)

## 2014-06-04 LAB — GLUCOSE, CAPILLARY: GLUCOSE-CAPILLARY: 95 mg/dL (ref 70–99)

## 2014-06-04 MED ORDER — TAMSULOSIN HCL 0.4 MG PO CAPS: 0.4000 mg | ORAL_CAPSULE | Freq: Every day | ORAL | Status: DC

## 2014-06-04 MED ORDER — FINASTERIDE 5 MG PO TABS: 5.0000 mg | ORAL_TABLET | Freq: Every day | ORAL | Status: DC

## 2014-06-04 NOTE — Progress Notes (Signed)
Staples removed from left hip and leg.    Patient tolerated well.  Incision remains closed.

## 2014-06-04 NOTE — Progress Notes (Signed)
ANTICOAGULATION CONSULT NOTE - Follow Up Consult  Pharmacy Consult for Coumadin Indication: VTE prophylaxis s/p recent left hip   h/o afib (but not on coumadin for afib prior to hip fracture). No Known Allergies  Labs:  Recent Labs  06/02/14 0339 06/03/14 0810 06/04/14 0318  HGB  --  8.5*  --   HCT  --  25.2*  --   PLT  --  423*  --   LABPROT 34.5* 33.9* 26.5*  INR 3.42* 3.34* 2.44*  CREATININE 0.71 0.69  --     Estimated Creatinine Clearance: 61.8 ml/min (by C-G formula based on Cr of 0.69).   Assessment: 80 yo m admitted on 9/30 with SOB and LE edema.  Patient was on coumadin PTA for VTE prophylaxis s/p recent hip fracture surgerh (05/22/14) in patient with h/o afib. He was not on coumadin for afib prior to the hip fracture due to falls/ increase fall risk.  Appears he was started on coumadin at [facility] some time after discharged from [facility] 9/24. Coumadin dose at [facility] was 4 mg PO daily with a therapeutic INR on admission.  Today for discharge the MD notes to continue coumadin for at least 4 weeks and then discussion with cardiology regarding aspirin plus Plavix vs. Coumadin for anticoagulation-patient had hip surgery 05/22/14. MD also noted prior ASA and plavix is on hold, may resume after Coumadin course of one month subsequently-differ planning to cardiology as outpatient.   Today, INR is therapeutic at 2.44 ,trending down from 3.34 yesterday. No bleeding reported.   Goal of Therapy:  INR 2-3 Monitor platelets by anticoagulation protocol: Yes   Plan:  Patient is being discharge today.  MD resuming coumadin 4mg  daily with INR monitoring as outpatient.  MD notes to continue coumadin for at least 4 weeks and then discussion with cardiology regarding aspirin plus Plavix vs. Coumadin for anticoagulation-patient had hip surgery 05/22/14.   Monitor CBC, INR, s/s of bleeding, clinical course  Thank you. Noah Delaine, RPh Clinical Pharmacist Pager: [phone] 06/04/2014 11:19 AM

## 2014-06-04 NOTE — Progress Notes (Signed)
Physical Therapy Treatment Patient Details Name: Tawny HoppingOren Landry MRN: [id number] DOB: 11/17/1923 Today's Date: 06/04/2014    History of Present Illness 80 y/o, h/o recent admit 9/21-9/24/15 for L hip fracture after fall, resluting in pinning by Dr. August Saucerean on 05/21/14. Pt with Afib (not on coumadin due to falls), prior TIA 07/2012 with mild residual left sided weakness, re-presented from [facility] ? wob/sob and LE edema. Pt with acute hypoxic respiratory failure as well as bilateral obstructive uropathy.    PT Comments    Pt was able to assist movement but generally very weak.  Pulse was stable with exercise from 92-96 b/min.  Has tolerated sitting with brief rest and cleared "dizziness" from initial sitting.  Plan to go to SNF likely today.  Follow Up Recommendations  SNF;Supervision/Assistance - 24 hour     Equipment Recommendations  None recommended by PT    Recommendations for Other Services Other (comment)     Precautions / Restrictions Precautions Precautions: Fall Restrictions Weight Bearing Restrictions: Yes LLE Weight Bearing: Partial weight bearing LLE Partial Weight Bearing Percentage or Pounds: 45-50%     Mobility  Bed Mobility Overal bed mobility: Needs Assistance Bed Mobility: Supine to Sit     Supine to sit: Mod assist     General bed mobility comments: mod A for mvmt of LLE and elevation of trunk  Transfers Overall transfer level: Needs assistance Equipment used: Rolling walker (2 wheeled);1 person hand held assist Transfers: Sit to/from Stand Sit to Stand: Mod assist         General transfer comment: Pt is tolerating at best 25% wb due to weakness and  did remind for posture at walker  Ambulation/Gait             General Gait Details: unable   Stairs            Wheelchair Mobility    Modified Rankin (Stroke Patients Only)       Balance Overall balance assessment: Needs assistance Sitting-balance support: Feet supported;Bilateral  upper extremity supported Sitting balance-Leahy Scale: Fair Sitting balance - Comments: Tends to lean back with any effort of LE's Postural control: Posterior lean Standing balance support: Bilateral upper extremity supported Standing balance-Leahy Scale: Poor Standing balance comment: Controlled with cues for use of walker and min assist to maintain balance at walker                    Cognition Arousal/Alertness: Awake/alert Behavior During Therapy: WFL for tasks assessed/performed Overall Cognitive Status: History of cognitive impairments - at baseline       Memory: Decreased short-term memory              Exercises General Exercises - Lower Extremity Long Arc Quad: AAROM;Left;10 reps;Seated Heel Slides: Strengthening;Both;10 reps;Seated Hip Flexion/Marching: AAROM;Left;10 reps;Seated Toe Raises: Strengthening;Both;10 reps;Standing    General Comments General comments (skin integrity, edema, etc.): Pt is moving slowly and had not apparently been able to walk since his pinning.  Very motivated to try but generally weak      Pertinent Vitals/Pain Pain Assessment: No/denies pain Pain Score: 0-No pain    Home Living                      Prior Function            PT Goals (current goals can now be found in the care plan section) Acute Rehab PT Goals Patient Stated Goal: Get home Progress towards PT goals: Progressing toward  goals    Frequency  Min 3X/week    PT Plan Current plan remains appropriate    Co-evaluation             End of Session Equipment Utilized During Treatment: Gait belt Activity Tolerance: Patient tolerated treatment well Patient left: in bed;with call bell/phone within reach     Time: 0829-0858 PT Time Calculation (min): 29 min  Charges:  $Therapeutic Exercise: 8-22 mins $Therapeutic Activity: 8-22 mins                    G Codes:      Ivar Drape 2014-06-16, 9:07 AM Samul Dada, PT MS Acute Rehab Dept.  Number: [phone]

## 2014-06-04 NOTE — Progress Notes (Signed)
Clinical Social Worker facilitated patient discharge including contacting patient family and facility to confirm patient discharge plans.  Clinical information faxed to facility and family  (Delois Artis- sister) agreeable with plan. CSW arranged ambulance transport via [facility] back to [facility].   RN called report prior to discharge. Clinical Social Worker will sign off as social work intervention is no longer needed. Lovette Cliche, LCSW   [phone]

## 2014-06-04 NOTE — Discharge Summary (Signed)
Physician Discharge Summary  Erik Arnold ZOX:[id number] DOB: 1923-09-10 DOA: 05/30/2014  PCP: Quintella Reichert, MD  Admit date: 05/30/2014 Discharge date: 06/04/2014  Time spent: 30 minutes  Recommendations for Outpatient Follow-up:  1. Patient will need outpatient urology followup and will need the Foley catheter is until seen by Dr. Mena Goes  2. Continue Coumadin for at least 4 weeks and then discussion with cardiology regarding aspirin plus Plavix vs. Coumadin for anticoagulation-patient had hip surgery 05/22/14 so would need at least one month of anticoagulation moving forward 3. Patient will need outpatient CBC plus basic metabolic panel in about one week plus INR 4. Consider pacemaker for tachybradycardia syndrome as per cardiology which he'll need followup within the next 1-2 weeks 5. Patient to be discharged to [facility] home  Discharge Diagnoses:  Principal Problem:   Acute bilateral obstructive uropathy Active Problems:   TIA (transient ischemic attack)   Chronic atrial fibrillation   Acute kidney failure   Hyperkalemia   Metabolic acidosis   Transaminitis   AKI (acute kidney injury)   Dyspnea   Acute diastolic congestive heart failure   Discharge Condition: good  Diet recommendation: heart healthy low salt  Filed Weights   06/02/14 0323 06/03/14 0514 06/04/14 0603  Weight: 84.6 kg (186 lb 8.2 oz) 83.553 kg (184 lb 3.2 oz) 79.3 kg (174 lb 13.2 oz)    History of present illness:  80 y/o ?, h/o recent admit 9/21-9/24/15 for L hip #, Afib CHad2Vasc2 score~4 not on coumadin 2/2 to falls, prior TIA 07/2012, re-presented from [facility] ? wob/sob and LE edema. VQ scan was negative  Found to have acute kidney injury and hyperkalemia and Afib c RVR.  It was thought that his renal failure was secondary to obstructive uropathy and a Foley was placed and he immediately passed urine. This was dark colored initially but subsequently cleared up. The conus of his obstructive uropathy  and inability to void with clamping the Foley urology was consulted and Cardiology was consulted to help with management of his A. fib as well as her motivation and medications were adjusted as below   Hospital Course:  1. Acute obstructive uropathy-creatinine trending down well with Foley placement. Start Flomax 0.4 mg daily. No sensation to void therefore a urology consulted and added finasteride 5 mg to his management. Will need outpatient urology care to followup and see if catheter can be removed 2. Acute kidney injury resolving as above. Saline lock IV fluids 10/2. I have discontinued Lasix 10/2 as well and placed him on by mouth Lasix 40 twice a day which was adjusted to once daily 40 mg by cardiology--on discharge lasix was discontinued as was auto-diuresisng 3. Chronic atrial fibrillation with pauses and V. tach-continue Toprol 12.5 twice a day. Hold digoxin given renal function. May not be a candidate for this long-term-defer to cardiology. Echocardiogram 10/2 EF 60-65% he has some pauses and low rate [facility] continue monotherapy with metoprolol.  Defer to cardiology need for PPM 4. Bradycardia- secondary to metoprolol. Defer to cardiology. 2.2 second was noted overnight in addition to 15 beats of V. tach-magnesium checked in 10/3 was 1.8 5. Catheter-induced hematuria-resolved-urine stream is clear 6. Anemia of blood loss from hematuria-trasnfuse if below 7 7. Acute hypoxic respiratory failure secondary to pulmonary edema. VQ scan was negative  8. Metabolic acidosis-resolved.  9. Hyperkalemia-possibly a result of metabolic acidosis and renal insufficiency. Potassium of 3.8 on 10/4 10. Recent hip fracture-place on Coumadin per pharmacy consult. Monitor. INR 3.4. We will  ask orthopedics to remove his staples before discharge. Continue Tylenol scheduled in 4-6 when necessary and Norco 1 tablet at night for pain 11. Hypervolemic hyponatremia-resolved 12. Prior TIA-aspirin as well as Plavix  on hold. May resume after Coumadin course of one month subsequently-differ planning to cardiology as outpatient 13. Leukocytosis-present in 06/03/14 however no fevers or chills-monitor with labs in the next week   Consultants:  Nephrology  Cardiology Urology Procedures:  none Antibiotics:  none  Discharge Exam: Filed Vitals:   06/04/14 0603  BP: 110/58  Pulse: 94  Temp: 98.4 F (36.9 C)  Resp: 17    General:  Alert pleasant no distress Cardiovascular:  S1-S2 no murmur or gallop Respiratory:  Clinically clear  Discharge Instructions You were cared for by a hospitalist during your hospital stay. If you have any questions about your discharge medications or the care you received while you were in the hospital after you are discharged, you can call the unit and asked to speak with the hospitalist on call if the hospitalist that took care of you is not available. Once you are discharged, your primary care physician will handle any further medical issues. Please note that NO REFILLS for any discharge medications will be authorized once you are discharged, as it is imperative that you return to your primary care physician (or establish a relationship with a primary care physician if you do not have one) for your aftercare needs so that they can reassess your need for medications and monitor your lab values.  Discharge Instructions   Diet - low sodium heart healthy    Complete by:  As directed      Increase activity slowly    Complete by:  As directed           Current Discharge Medication List    START taking these medications   Details  finasteride (PROSCAR) 5 MG tablet Take 1 tablet (5 mg total) by mouth daily.    tamsulosin (FLOMAX) 0.4 MG CAPS capsule Take 1 capsule (0.4 mg total) by mouth daily after breakfast. Qty: 30 capsule      CONTINUE these medications which have NOT CHANGED   Details  bisacodyl (DULCOLAX) 5 MG EC tablet Take 1 tablet (5 mg total) by mouth daily  as needed for moderate constipation. Qty: 30 tablet, Refills: 0    docusate sodium 100 MG CAPS Take 100 mg by mouth 2 (two) times daily. Qty: 10 capsule, Refills: 0    HYDROcodone-acetaminophen (NORCO/VICODIN) 5-325 MG per tablet Take 1-2 tablets by mouth every 4 (four) hours as needed for moderate pain. Qty: 60 tablet, Refills: 0    iron polysaccharides (NIFEREX) 150 MG capsule Take 150 mg by mouth daily.    metoprolol tartrate (LOPRESSOR) 25 MG tablet Take 0.5 tablets (12.5 mg total) by mouth 2 (two) times daily. Qty: 30 tablet, Refills: 11    polyethylene glycol (MIRALAX / GLYCOLAX) packet Take 17 g by mouth daily. Qty: 14 each, Refills: 0    senna (SENOKOT) 8.6 MG TABS tablet Take 1 tablet by mouth daily.    warfarin (COUMADIN) 4 MG tablet Take 4 mg by mouth daily.      STOP taking these medications     aspirin EC 325 MG EC tablet      digoxin (LANOXIN) 0.125 MG tablet      aspirin 81 MG EC tablet      clopidogrel (PLAVIX) 75 MG tablet       Will see him in he  is in a well and  No Known Allergies Follow-up Information   Follow up with Jerilee Field, MD In 3 weeks.   Specialty:  Urology   Contact information:   [address] [place] [address] [phone]       Follow up with Quintella Reichert, MD In 1 week.   Specialty:  Cardiology   Contact information:   [address] [place] [address] [phone]        The results of significant diagnostics from this hospitalization (including imaging, microbiology, ancillary and laboratory) are listed below for reference.    Significant Diagnostic Studies: Dg Chest 1 View  05/21/2014   CLINICAL DATA:  Left hip fracture following a fall.  EXAM: CHEST - 1 VIEW  COMPARISON:  None.  FINDINGS: Borderline enlarged cardiac silhouette. Tortuous aorta. Mildly prominent interstitial markings with minimal somewhat patchy density at the left lateral lung base. Old, healed left inferior rib fractures  laterally. No acute fracture or pneumothorax. Thoracic spine and cervical spine degenerative changes.  IMPRESSION: Borderline cardiomegaly, mild chronic interstitial lung disease and small amount atelectasis or scarring at the left lung base.   Electronically Signed   By: Gordan Payment M.D.   On: 05/21/2014 12:01   Dg Hip Complete Left  05/21/2014   CLINICAL DATA:  Fall with left hip fracture.  EXAM: LEFT HIP - COMPLETE 2+ VIEW  COMPARISON:  None.  FINDINGS: There is an intertrochanteric fracture of the left femur with varus angulation of the fracture fragments. No additional evidence of an acute fracture. Bones appear demineralized. Degenerative changes are seen in the lumbosacral spine.  IMPRESSION: 1. Left intertrochanteric femur fracture. 2. Osteopenia. 3. Lumbar spondylosis.   Electronically Signed   By: Leanna Battles M.D.   On: 05/21/2014 11:58   Dg Femur Left  05/22/2014   CLINICAL DATA:  Left hip ORIF  EXAM: DG C-ARM 61-120 MIN; LEFT FEMUR - 2 VIEW  : COMPARISON:  05/21/2014  FINDINGS: Status post ORIF of an intertrochanteric left femur fracture. Fracture alignment is excellent. No evidence of hardware complication, including the periprosthetic fracture.  IMPRESSION: Intertrochanteric left femur fracture status post ORIF. No adverse findings.   Electronically Signed   By: Tiburcio Pea M.D.   On: 05/22/2014 02:36   Dg Femur Left  05/21/2014   CLINICAL DATA:  Fall with left hip fracture.  EXAM: LEFT FEMUR - 2 VIEW  COMPARISON:  None.  FINDINGS: There is an intertrochanteric fracture of the left femur with varus angulation. No dislocation. Bones appear demineralized. Left obturator ring appears grossly intact. Incidental imaging of the left knee reveals mild patellofemoral osteophytosis.  IMPRESSION: Left intertrochanteric femur fracture.  Osteopenia.   Electronically Signed   By: Leanna Battles M.D.   On: 05/21/2014 11:57   Dg Pelvis Portable  05/22/2014   CLINICAL DATA:  Postop.  EXAM:  PORTABLE PELVIS 1-2 VIEWS  COMPARISON:  05/21/2014.  FINDINGS: Status post ORIF of a left femur intertrochanteric fracture. Mildly impacted appearance of the medial fracture compared with intraoperative fluoroscopy is likely related to rotation of the left hip, although there could of also been some mild settling. Dynamic hip screw and intra medullary nail have an unremarkable appearance. The hip is located. No acute bony pelvis findings.  IMPRESSION: Intertrochanteric left femur fracture status post ORIF. No adverse findings.   Electronically Signed   By: Tiburcio Pea M.D.   On: 05/22/2014 03:00   Nm Pulmonary Perf And Vent  05/30/2014  CLINICAL DATA:  Shortness of Breath  EXAM: NUCLEAR MEDICINE VENTILATION - PERFUSION LUNG SCAN  Views: Anterior, posterior, left lateral, right lateral, RPO, LPO, RAO, LAO -ventilation and perfusion  Radionuclide: Technetium 68m DTPA -ventilation; Technetium 36m macroaggregated albumin-perfusion  Dose:  6.0 mCi-ventilation; 40.0 mCi- perfusion  Route of administration: Inhalation-ventilation; intravenous- perfusion  COMPARISON:  Chest radiograph May 30, 2014  FINDINGS: Ventilation: Radiotracer uptake bilaterally is essentially homogeneous and symmetric bilaterally. No focal ventilation defects are appreciable.  Perfusion: Radiotracer uptake bilaterally is essentially homogeneous and symmetric bilaterally. No focal perfusion defects are identified.  There is no appreciable ventilation/perfusion mismatch.  IMPRESSION: No ventilation or perfusion defects are appreciable. This study constitutes a very low probability of pulmonary embolus.   Electronically Signed   By: Bretta Bang M.D.   On: 05/30/2014 16:50   Dg Chest Port 1 View  05/30/2014   CLINICAL DATA:  Shortness of breath, stroke, atrial fibrillation  EXAM: PORTABLE CHEST - 1 VIEW  COMPARISON:  Portable exam 1018 hr compared to 05/21/2014  FINDINGS: Rotated to the RIGHT.  Enlargement of cardiac silhouette  with pulmonary vascular congestion.  Mediastinal contours normal.  Bibasilar atelectasis versus infiltrate  No pleural effusion or pneumothorax.  Minimal central peribronchial thickening.  No acute osseous findings.  IMPRESSION: Enlargement of cardiac silhouette with pulmonary vascular congestion.  Minimal bronchitic changes with bibasilar atelectasis versus infiltrate.   Electronically Signed   By: Ulyses Southward M.D.   On: 05/30/2014 10:31   Dg C-arm 1-60 Min  05/22/2014   CLINICAL DATA:  Left hip ORIF  EXAM: DG C-ARM 61-120 MIN; LEFT FEMUR - 2 VIEW  : COMPARISON:  05/21/2014  FINDINGS: Status post ORIF of an intertrochanteric left femur fracture. Fracture alignment is excellent. No evidence of hardware complication, including the periprosthetic fracture.  IMPRESSION: Intertrochanteric left femur fracture status post ORIF. No adverse findings.   Electronically Signed   By: Tiburcio Pea M.D.   On: 05/22/2014 02:36    Microbiology: Recent Results (from the past 240 hour(s))  URINE CULTURE     Status: None   Collection Time    05/30/14 11:34 AM      Result Value Ref Range Status   Specimen Description URINE, CATHETERIZED   Final   Special Requests NONE   Final   Culture  Setup Time     Final   Value: 05/30/2014 18:15     Performed at [facility]   Colony Count     Final   Value: NO GROWTH     Performed at [facility]   Culture     Final   Value: NO GROWTH     Performed at [facility]   Report Status 05/31/2014 FINAL   Final     Labs: Basic Metabolic Panel:  Recent Labs Lab 05/30/14 1005 05/30/14 1317 05/30/14 1329 05/30/14 1900 05/30/14 2116 05/30/14 2340 05/31/14 0401 06/01/14 0315 06/02/14 0339 06/02/14 1040 06/03/14 0810  NA 128*  --  133* 134* 139  --  140 142 140  --  139  K >7.7*  --  6.4* 5.5* 5.9*  --  5.2 4.0 4.0  --  3.8  CL 92*  --  94* 95* 98  --  100 106 107  --  106  CO2 11*  --  17* 18* 22  --  26 26 24   --  25  GLUCOSE 110*  --   81 87 97  --  91 101* 105*  --  101*  BUN 140*  --  130* 111* 105*  --  78* 36* 22  --  14  CREATININE 10.68*  --  8.44* 6.11* 5.47*  --  3.12* 0.91 0.71  --  0.69  CALCIUM 8.4  --  9.5 9.0 9.0  --  8.8 7.8* 7.8*  --  8.1*  MG  --  2.9*  --   --   --  2.4  --   --   --  1.8  --   PHOS  --   --  7.1* 5.9*  --   --   --   --   --   --   --    Liver Function Tests:  Recent Labs Lab 05/30/14 1317 05/30/14 1329 05/30/14 1900 05/30/14 2116 05/31/14 0401 06/03/14 0810  AST 19 18  --  17 16 17   ALT 11 11  --  10 10 12   ALKPHOS 88 85  --  78 73 73  BILITOT 0.4 0.4  --  0.5 0.7 0.5  PROT 6.7 6.6  --  6.2 5.8* 5.4*  ALBUMIN 2.8* 2.8* 2.7* 2.6* 2.6* 2.4*   No results found for this basename: LIPASE, AMYLASE,  in the last 168 hours No results found for this basename: AMMONIA,  in the last 168 hours CBC:  Recent Labs Lab 05/30/14 1005 05/31/14 0401 06/01/14 0315 06/03/14 0810  WBC 14.7* 8.2 7.8 11.3*  NEUTROABS 10.6* 6.0 5.3  --   HGB 10.6* 9.3* 8.1* 8.5*  HCT 30.6* 27.0* 24.6* 25.2*  MCV 108.5* 105.5* 111.8* 108.6*  PLT 320 324 327 423*   Cardiac Enzymes:  Recent Labs Lab 05/30/14 1317 05/30/14 1917 05/31/14 0141  TROPONINI <0.30 <0.30 <0.30   BNP: BNP (last 3 results)  Recent Labs  05/30/14 1005  PROBNP 3632.0*   CBG:  Recent Labs Lab 05/31/14 0739 06/01/14 0741 06/02/14 0805 06/03/14 0617 06/04/14 0546  GLUCAP 107* 106* 100* 94 95       Signed:  Linkyn Gobin, JAI-GURMUKH  [facility] 06/04/2014, 8:00 AM

## 2014-06-04 NOTE — Discharge Instructions (Signed)

## 2014-06-04 NOTE — Care Management Note (Addendum)
  Page 1 of 1   06/04/2014     12:02:55 PM CARE MANAGEMENT NOTE 06/04/2014  Patient:  CHARMAINE, SIVERS   Account Number:  [id number]  Date Initiated:  06/01/2014  Documentation initiated by:  Donn Pierini  Subjective/Objective Assessment:   Pt admitted with AKI, urinary retention     Action/Plan:   PTA pt lived at [facility]- SNF- CSW consulted   Anticipated DC Date:  06/04/2014   Anticipated DC Plan:  SKILLED NURSING FACILITY  In-house referral  Clinical Social Worker         Choice offered to / List presented to:             Status of service:  Completed, signed off Medicare Important Message given?  YES (If response is "NO", the following Medicare IM given date fields will be blank) Date Medicare IM given:  06/01/2014 Medicare IM given by:  [facility] Date Additional Medicare IM given:  06/04/2014 Additional Medicare IM given by:  Deran Barro  Discharge Disposition:  SKILLED NURSING FACILITY  Per UR Regulation:  Reviewed for med. necessity/level of care/duration of stay  If discussed at Long Length of Stay Meetings, dates discussed:   06/05/2014    Comments:  Donato Schultz RN, BSN, MSHL, CCM  Nurse - Case Manager,  ([facility] [place])  [phone]  06/04/2014 PT RECS:  SNF Dispo Plan;  SNF - Return back to [facility] - (SW / Lovette Cliche active)

## 2014-06-04 NOTE — Progress Notes (Signed)
Report given to [facility] at [facility].  All questions answered.

## 2014-06-04 NOTE — Progress Notes (Signed)
Patient discharged to [facility] via ambulance.  Patient discharged with all belongings.

## 2014-06-07 ENCOUNTER — Encounter: Payer: Medicare Other | Admitting: Physician Assistant

## 2014-06-20 ENCOUNTER — Encounter: Payer: Self-pay | Admitting: Physician Assistant

## 2014-06-20 ENCOUNTER — Ambulatory Visit (INDEPENDENT_AMBULATORY_CARE_PROVIDER_SITE_OTHER): Payer: Medicare Other | Admitting: Physician Assistant

## 2014-06-20 VITALS — BP 122/58 | HR 79 | Ht 68.0 in

## 2014-06-20 DIAGNOSIS — I482 Chronic atrial fibrillation, unspecified: Secondary | ICD-10-CM

## 2014-06-20 DIAGNOSIS — I509 Heart failure, unspecified: Secondary | ICD-10-CM

## 2014-06-20 DIAGNOSIS — I5030 Unspecified diastolic (congestive) heart failure: Secondary | ICD-10-CM

## 2014-06-20 DIAGNOSIS — N139 Obstructive and reflux uropathy, unspecified: Secondary | ICD-10-CM

## 2014-06-20 NOTE — Patient Instructions (Signed)
Your physician recommends that you continue on your current medications as directed. Please refer to the Current Medication list given to you today.  Your physician recommends that you schedule a follow-up appointment in: 1 MONTH WITH DR. Mayford Knife

## 2014-06-20 NOTE — Progress Notes (Signed)
Cardiology Office Note   Date:  06/20/2014   ID:  Erik Arnold, DOB March 11, 1924, MRN [id number]  PCP:  Quintella Reichert, MD  Cardiologist:  Dr. Armanda Magic     History of Present Illness: Erik Arnold is a 80 y.o. male with a history of chronic atrial fibrillation (not on anticoagulation due to high fall risk), prior TIA.  Admitted 9/21-9/24 with hip fracture status post ORIF.  Re-admitted 9/30-10/5 with acute HFpEF, atrial fibrillation with RVR and AKI with associated hyperkalemia in the setting of obstructive uropathy. Foley catheter was placed and urology was consulted. He was seen by cardiology.  Digoxin was held given acute renal failure.  VQ scan was negative.  He was diuresed with Lasix. Lasix was ultimately discontinued.  He did have some difficulty with bradycardia limiting use of his beta blocker.  He is now staying at [facility] SNF.  He is doing well.  Working with PT.  He is going to try to go the the ALF section when his rehab stay is up.  The patient denies chest pain, shortness of breath, syncope, orthopnea, PND or significant pedal edema.    Studies:  - Echo (10/15):  EF 60-65%, mild AI, mean aortic valve gradient 10 mm Hg, mild MR, severe LAE, mild RAE, PASP 42 mm Hg  - Carotid US (12/13):  No significant ICA stenosis or plaque visualized bilaterally   Recent Labs/Images:  Recent Labs  05/30/14 1005  06/03/14 0810  NA 128*  < > 139  K >7.7*  < > 3.8  BUN 140*  < > 14  CREATININE 10.68*  < > 0.69  ALT  --   < > 12  HGB 10.6*  < > 8.5*  PROBNP 3632.0*  --   --   < > = values in this interval not displayed.   Nm Pulmonary Perf And Vent   05/30/2014  IMPRESSION: No ventilation or perfusion defects are appreciable. This study constitutes a very low probability of pulmonary embolus.   Electronically Signed   By: Bretta Bang M.D.   On: 05/30/2014 16:50   Dg Chest Port 1 View   05/30/2014     IMPRESSION: Enlargement of cardiac silhouette with pulmonary vascular congestion.   Minimal bronchitic changes with bibasilar atelectasis versus infiltrate.   Electronically Signed   By: Ulyses Southward M.D.   On: 05/30/2014 10:31     Wt Readings from Last 3 Encounters:  06/04/14 174 lb 13.2 oz (79.3 kg)  05/17/14 171 lb 12.8 oz (77.928 kg)  10/02/13 169 lb (76.658 kg)     Past Medical History  Diagnosis Date  . Stroke 08/22/2012    residual left sided weakness (08/23/2012)  . TIA (transient ischemic attack)   . Carpal tunnel syndrome     diagnosed in 1997  . Chronic atrial fibrillation     Not on coumadin as he was deemed a fall risk after he fell in his cardiologists office.    Current Outpatient Prescriptions  Medication Sig Dispense Refill  . docusate sodium 100 MG CAPS Take 100 mg by mouth 2 (two) times daily.  10 capsule  0  . finasteride (PROSCAR) 5 MG tablet Take 1 tablet (5 mg total) by mouth daily.      Marland Kitchen HYDROcodone-acetaminophen (NORCO/VICODIN) 5-325 MG per tablet Take 1-2 tablets by mouth every 4 (four) hours as needed for moderate pain.  60 tablet  0  . iron polysaccharides (NIFEREX) 150 MG capsule Take 150 mg by mouth daily.      Marland Kitchen  metoprolol tartrate (LOPRESSOR) 25 MG tablet Take 0.5 tablets (12.5 mg total) by mouth 2 (two) times daily.  30 tablet  11  . polyethylene glycol (MIRALAX / GLYCOLAX) packet Take 17 g by mouth daily.  14 each  0  . senna (SENOKOT) 8.6 MG TABS tablet Take 1 tablet by mouth daily.      . tamsulosin (FLOMAX) 0.4 MG CAPS capsule Take 1 capsule (0.4 mg total) by mouth daily after breakfast.  30 capsule    . valACYclovir (VALTREX) 500 MG tablet Take 500 mg by mouth 2 (two) times daily.      Marland Kitchen. warfarin (COUMADIN) 4 MG tablet Take 4 mg by mouth daily.       No current facility-administered medications for this visit.     Allergies:   Review of patient's allergies indicates no known allergies.   Social History:  The patient  reports that he has quit smoking. His smoking use included Pipe. He has quit using smokeless tobacco. His  smokeless tobacco use included Chew. He reports that he does not drink alcohol or use illicit drugs.   Family History:  The patient's family history includes CVA in his mother; Pneumonia in his father.   ROS:  Please see the history of present illness.   He has had L leg pain from his fx.   All other systems reviewed and negative.    PHYSICAL EXAM: VS:  BP 122/58  Pulse 79  Ht 5\' 8"  (1.727 m) Well nourished, well developed, in no acute distress HEENT: normal Neck: no JVD90 degrees Cardiac:  normal S1, S2; irreg irreg rhythm; no murmur Lungs:  clear to auscultation bilaterally, no wheezing, rhonchi or rales Abd: soft, nontender, no hepatomegaly Ext: no edema Skin: warm and dry Neuro:  CNs 2-12 intact, no focal abnormalities noted  EKG:  AFib, HR 79      ASSESSMENT AND PLAN:  Chronic atrial fibrillation -  Rate is well controlled.  He is now on Coumadin.  This was started for DVT prophylaxis after his hip surgery.  He may be better monitored if he stays in ALF at [facility].  Therefore, we may be able to keep him on Coumadin long term.  I will review this with Dr. Armanda Magicraci Turner.    (HFpEF) heart failure with preserved ejection fraction  -  This was in the setting of obstructive uropathy and ARF (creatinine 10.68).  Volume is stable off diuretics.   Acute bilateral obstructive uropathy -  FU with urology as planned.  Foley still in place.   Disposition:   FU with Dr. Armanda Magicraci Turner 1 month   Signed, Brynda RimScott Sayre Witherington, PA-C, MHS 06/20/2014 2:23 PM    [facility] [address], [place], [place]  [address] Phone: [phone]; Fax: [phone]

## 2014-07-05 ENCOUNTER — Ambulatory Visit (INDEPENDENT_AMBULATORY_CARE_PROVIDER_SITE_OTHER): Payer: Medicare Other | Admitting: Neurology

## 2014-07-05 ENCOUNTER — Encounter: Payer: Self-pay | Admitting: Neurology

## 2014-07-05 VITALS — BP 140/80 | HR 72 | Ht 68.0 in | Wt 171.0 lb

## 2014-07-05 DIAGNOSIS — M6289 Other specified disorders of muscle: Secondary | ICD-10-CM

## 2014-07-05 DIAGNOSIS — R32 Unspecified urinary incontinence: Secondary | ICD-10-CM

## 2014-07-05 DIAGNOSIS — R269 Unspecified abnormalities of gait and mobility: Secondary | ICD-10-CM | POA: Insufficient documentation

## 2014-07-05 DIAGNOSIS — R531 Weakness: Secondary | ICD-10-CM | POA: Insufficient documentation

## 2014-07-05 NOTE — Progress Notes (Addendum)
PATIENT: Erik Arnold DOB: 08-07-1924  HISTORICAL  Erik Arnold is a 80 years old right-handed male, nursing home resident, alone at today's visit  He had a past medical history of chronic atrial fibrillation,on chronic Coumadin treatment, also reported previous history of TIA, with worsening left-sided weakness in December 2013, he presented to emergency room at that time, I was able to review MRI scan in December 24th  2013, there was no new stroke, chronic small vessel disease, evidence of previous left coronal radiata stroke.  He retired at age 62 from [employer] work, lives alone at home, onto he fell and broke his left hip in September 21st 2015, he had left hip intertrochanteric fracture, open reduction and internal fixation, was discharged to nursing home ever since. But he had a profound left leg weakness, could no longer lifted up against gravity despite there was no longer significant left hip pain, he now has Foley catheter ,also with worsening left arm weakness.  He reported long-standing history of mild left-sided weakness, reported in early 1980s,after strenuous activity, he felt sudden onset mild left arm, and leg weakness, which has been persistent, over the years, it gradually getting worse, prior to his fell, and admission to hospital, he was using a walker, reported frequent nocturnal urination,but denies incontinence.  REVIEW OF SYSTEMS: Full 14 system review of systems performed and notable only for As above  ALLERGIES: No Known Allergies  HOME MEDICATIONS: Current Outpatient Prescriptions on File Prior to Visit  Medication Sig Dispense Refill  . docusate sodium 100 MG CAPS Take 100 mg by mouth 2 (two) times daily. 10 capsule 0  . finasteride (PROSCAR) 5 MG tablet Take 1 tablet (5 mg total) by mouth daily.    Marland Kitchen HYDROcodone-acetaminophen (NORCO/VICODIN) 5-325 MG per tablet Take 1-2 tablets by mouth every 4 (four) hours as needed for moderate pain. 60 tablet 0  . iron  polysaccharides (NIFEREX) 150 MG capsule Take 150 mg by mouth daily.    . metoprolol tartrate (LOPRESSOR) 25 MG tablet Take 0.5 tablets (12.5 mg total) by mouth 2 (two) times daily. 30 tablet 11  . polyethylene glycol (MIRALAX / GLYCOLAX) packet Take 17 g by mouth daily. 14 each 0  . senna (SENOKOT) 8.6 MG TABS tablet Take 1 tablet by mouth daily.    . tamsulosin (FLOMAX) 0.4 MG CAPS capsule Take 1 capsule (0.4 mg total) by mouth daily after breakfast. 30 capsule   . valACYclovir (VALTREX) 500 MG tablet Take 500 mg by mouth 2 (two) times daily.    Marland Kitchen warfarin (COUMADIN) 4 MG tablet Take 4 mg by mouth daily.     No current facility-administered medications on file prior to visit.    PAST MEDICAL HISTORY: Past Medical History  Diagnosis Date  . Stroke 08/22/2012    residual left sided weakness (08/23/2012)  . TIA (transient ischemic attack)   . Carpal tunnel syndrome     diagnosed in 1997  . Chronic atrial fibrillation     Not on coumadin as he was deemed a fall risk after he fell in his cardiologists office.    PAST SURGICAL HISTORY: Past Surgical History  Procedure Laterality Date  . Inguinal hernia repair  ~ 1992    "left" (08/23/2012)  . Cataract extraction w/ intraocular lens  implant, bilateral  ~ 2005  . Proctosigmoidoscopy      November 2001 normal screening proctocolonoscopy to the cecum   . Intramedullary (im) nail intertrochanteric Left 05/21/2014    Procedure: LEFT INTRAMEDULLARY (  IM) NAIL INTERTROCHANTRIC;  Surgeon: Cammy Copa, MD;  Location: [facility];  Service: Orthopedics;  Laterality: Left;    FAMILY HISTORY: Family History  Problem Relation Age of Onset  . CVA Mother   . Pneumonia Father     SOCIAL HISTORY:  History   Social History  . Marital Status: Unknown    Spouse Name: N/A    Number of Children: 0  . Years of Education: 12   Occupational History    Retired   Social History Main Topics  . Smoking status: Former Smoker -- 5 years     Types: Pipe  . Smokeless tobacco: Former Neurosurgeon    Types: Chew     Comment: 08/23/2012 "smoked a little pipe in my 20's; haven't chewed in ~ 79month"  . Alcohol Use: No     Comment: 08/23/2012 "last alcohol was a beer in 1980; never had problems w/it"  . Drug Use: No  . Sexual Activity: No   Other Topics Concern  . Not on file   Social History Narrative   Patient lives at [facility].   Retired.   Education high school.   Right handed.   Caffeine two cups of tea daily.     PHYSICAL EXAM   Filed Vitals:   07/05/14 1122  BP: 140/80  Pulse: 72  Height: 5\' 8"  (1.727 m)  Weight: 171 lb (77.565 kg)    Not recorded      Body mass index is 26.01 kg/(m^2).   Generalized: In no acute distress  Neck: Supple, no carotid bruits   Cardiac: irregular rate rhythm  Pulmonary: Clear to auscultation bilaterally  Musculoskeletal: No deformity  Neurological examination  Mentation: Alert oriented to time, place, history taking, and causual conversation, MMSE is 26/30, he is not oriented to place  Cranial nerve II-XII: Pupils were equal round reactive to light. Extraocular movements were full.  Visual field were full on confrontational test. Facial sensation and strength were normal. Hearing was intact to finger rubbing bilaterally. Uvula tongue midline.  Head turning and shoulder shrug and were normal and symmetric.Tongue protrusion into cheek strength was normal.  Motor: he has right hand intrinsic hand muscle atrophy, mild right finger abduction weakness, left upper extremity proximal and distal strength is 3, left lower extremity proximal and distal is 1, right leg has no significant weakness.  Sensory: length dependent decreased fine touch, pinprick at bilateral lower extremities.  Coordination: Normal finger to nose, heel-to-shin of right side Gait: deferred Deep tendon reflexes: hyperreflexia of left arm, and left leg  DIAGNOSTIC DATA (LABS, IMAGING, TESTING) - I  reviewed patient records, labs, notes, testing and imaging myself where available.  Lab Results  Component Value Date   WBC 11.3* 06/03/2014   HGB 8.5* 06/03/2014   HCT 25.2* 06/03/2014   MCV 108.6* 06/03/2014   PLT 423* 06/03/2014      Component Value Date/Time   NA 139 06/03/2014 0810   K 3.8 06/03/2014 0810   CL 106 06/03/2014 0810   CO2 25 06/03/2014 0810   GLUCOSE 101* 06/03/2014 0810   BUN 14 06/03/2014 0810   CREATININE 0.69 06/03/2014 0810   CALCIUM 8.1* 06/03/2014 0810   PROT 5.4* 06/03/2014 0810   ALBUMIN 2.4* 06/03/2014 0810   AST 17 06/03/2014 0810   ALT 12 06/03/2014 0810   ALKPHOS 73 06/03/2014 0810   BILITOT 0.5 06/03/2014 0810   GFRNONAA 82* 06/03/2014 0810   GFRAA >90 06/03/2014 0810   Lab Results  Component Value Date   CHOL 129 08/23/2012   HDL 44 08/23/2012   LDLCALC 74 08/23/2012   TRIG 55 08/23/2012   CHOLHDL 2.9 08/23/2012   Lab Results  Component Value Date   HGBA1C 5.0 08/23/2012   Lab Results  Component Value Date   VITAMINB12 365 08/24/2012   Lab Results  Component Value Date   TSH 3.171 08/24/2012   ASSESSMENT AND PLAN  Erik Arnold is a 80 y.o. male complains of worsening left-sided weakness, involving left arm, left leg, gait difficulty, long-standing history of previous mild left arm and leg weakness, recent fall, left hip fracture, status post internal fixation.  1.  Differentiation diagnosis including right hemisphere stroke, versus cervical cord pathology 2. Proceed with MRI of brain, MRI of cervical spine 3. Continue physical therapy 4, return to clinic in 2- 3 weeks    Erik Arnold, M.D. Ph.D.  [facility] [address], [address] [place], [place] [address] [phone]

## 2014-07-23 ENCOUNTER — Ambulatory Visit (INDEPENDENT_AMBULATORY_CARE_PROVIDER_SITE_OTHER): Payer: Medicare Other | Admitting: Cardiology

## 2014-07-23 ENCOUNTER — Other Ambulatory Visit: Payer: Self-pay | Admitting: Internal Medicine

## 2014-07-23 ENCOUNTER — Encounter: Payer: Self-pay | Admitting: Cardiology

## 2014-07-23 VITALS — BP 122/56 | HR 91 | Ht 68.0 in

## 2014-07-23 DIAGNOSIS — R531 Weakness: Secondary | ICD-10-CM

## 2014-07-23 DIAGNOSIS — I5030 Unspecified diastolic (congestive) heart failure: Secondary | ICD-10-CM

## 2014-07-23 DIAGNOSIS — I482 Chronic atrial fibrillation, unspecified: Secondary | ICD-10-CM

## 2014-07-23 DIAGNOSIS — I509 Heart failure, unspecified: Secondary | ICD-10-CM

## 2014-07-23 NOTE — Patient Instructions (Signed)
Your physician recommends that you continue on your current medications as directed. Please refer to the Current Medication list given to you today.  Your physician wants you to follow-up in: 6 months with Dr Turner You will receive a reminder letter in the mail two months in advance. If you don't receive a letter, please call our office to schedule the follow-up appointment.  

## 2014-07-23 NOTE — Progress Notes (Signed)
[address] [place], [place]  [address] Phone: [phone] Fax:  [phone]  Date:  07/23/2014   ID:  Erik HoppingOren Wyszynski, DOB 08/16/1924, MRN [id number]  PCP:  Quintella ReichertURNER,Haji Delaine R, MD  Cardiologist:  Armanda Magicraci Aitan Rossbach, MD    History of Present Illness: Erik Arnold is a 80 y.o. male with a history of chronic atrial fibrillation (not on anticoagulation due to high fall risk), prior TIA. Admitted 9/21-9/24 with hip fracture status post ORIF. Re-admitted 9/30-10/5 with acute HFpEF, atrial fibrillation with RVR and AKI with associated hyperkalemia in the setting of obstructive uropathy. Foley catheter was placed and urology was consulted.  Digoxin was held given acute renal failure. VQ scan was negative. He was diuresed with Lasix. Lasix was ultimately discontinued. He did have some difficulty with bradycardia limiting use of his beta blocker.  [facility] is now staying at [facility]. He is doing well. Working with PT. He is going to try to go the the ALF section when his rehab stay is up. The patient denies chest pain, shortness of breath, syncope, orthopnea, PND or significant pedal edema or palpitations.    Wt Readings from Last 3 Encounters:  07/05/14 171 lb (77.565 kg)  06/04/14 174 lb 13.2 oz (79.3 kg)  05/17/14 171 lb 12.8 oz (77.928 kg)     Past Medical History  Diagnosis Date  . Stroke 08/22/2012    residual left sided weakness (08/23/2012)  . TIA (transient ischemic attack)   . Carpal tunnel syndrome     diagnosed in 1997  . Chronic atrial fibrillation     Not on coumadin as he was deemed a fall risk after he fell in his cardiologists office.    Current Outpatient Prescriptions  Medication Sig Dispense Refill  . bisacodyl (DULCOLAX) 5 MG EC tablet Take 5 mg by mouth daily as needed for moderate constipation.    . docusate sodium 100 MG CAPS Take 100 mg by mouth 2 (two) times daily. 10 capsule 0  . finasteride (PROSCAR) 5 MG tablet Take 1 tablet (5 mg total) by mouth daily.      Marland Kitchen. HYDROcodone-acetaminophen (NORCO/VICODIN) 5-325 MG per tablet Take 1-2 tablets by mouth every 4 (four) hours as needed for moderate pain. 60 tablet 0  . iron polysaccharides (NIFEREX) 150 MG capsule Take 150 mg by mouth daily.    . metoprolol tartrate (LOPRESSOR) 25 MG tablet Take 0.5 tablets (12.5 mg total) by mouth 2 (two) times daily. 30 tablet 11  . polyethylene glycol (MIRALAX / GLYCOLAX) packet Take 17 g by mouth daily. 14 each 0  . senna (SENOKOT) 8.6 MG TABS tablet Take 1 tablet by mouth daily.    . tamsulosin (FLOMAX) 0.4 MG CAPS capsule Take 1 capsule (0.4 mg total) by mouth daily after breakfast. 30 capsule   . warfarin (COUMADIN) 4 MG tablet Take 4 mg by mouth as directed.      No current facility-administered medications for this visit.    Allergies:   No Known Allergies  Social History:  The patient  reports that he has quit smoking. His smoking use included Pipe. He has quit using smokeless tobacco. His smokeless tobacco use included Chew. He reports that he does not drink alcohol or use illicit drugs.   Family History:  The patient's family history includes CVA in his mother; Pneumonia in his father.   ROS:  Please see the history of present illness.      All other systems reviewed and negative.  PHYSICAL EXAM: VS:  BP 122/56 mmHg  Pulse 91  Ht 5\' 8"  (1.727 m)  SpO2 97% Well nourished, well developed, in no acute distress HEENT: normal Neck: no JVD Cardiac:  normal S1, S2; irregularly irregular; no murmur Lungs:  clear to auscultation bilaterally, no wheezing, rhonchi or rales Abd: soft, nontender, no hepatomegaly Ext: no edema Skin: warm and dry Neuro:  CNs 2-12 intact, no focal abnormalities noted   ASSESSMENT AND PLAN:  Chronic atrial fibrillation - Rate is well controlled. He is now on Coumadin. This was started for DVT prophylaxis after his hip surgery. He may be better monitored if he stays in ALF at [facility]. Therefore, we may be able to keep him on  Coumadin long term.   (HFpEF) heart failure with preserved ejection fraction - This was in the setting of obstructive uropathy and ARF (creatinine 10.68). Volume is stable off diuretics.   Acute bilateral obstructive uropathy - FU with urology as planned. Foley still in place.     Followup with me in 6 months    Signed, Armanda Magic, MD [facility] 07/23/2014 1:47 PM

## 2014-07-25 ENCOUNTER — Ambulatory Visit
Admission: RE | Admit: 2014-07-25 | Discharge: 2014-07-25 | Disposition: A | Discharge status: Home / Self Care | Payer: Medicare Other | Source: Ambulatory Visit | Attending: Neurology | Admitting: Neurology

## 2014-07-25 DIAGNOSIS — R269 Unspecified abnormalities of gait and mobility: Secondary | ICD-10-CM

## 2014-07-25 DIAGNOSIS — M6289 Other specified disorders of muscle: Secondary | ICD-10-CM

## 2014-07-25 DIAGNOSIS — R32 Unspecified urinary incontinence: Secondary | ICD-10-CM

## 2014-07-25 DIAGNOSIS — R531 Weakness: Secondary | ICD-10-CM

## 2014-07-30 ENCOUNTER — Telehealth: Payer: Self-pay | Admitting: Neurology

## 2014-07-30 NOTE — Telephone Encounter (Signed)
I have called patient, explained, MRI of the brain, and cervical spine, showed chronic abnormalities, we will review the film,  explained findings in detail in his next follow-up appointment in August 06 2014

## 2014-08-01 ENCOUNTER — Ambulatory Visit
Admission: RE | Admit: 2014-08-01 | Discharge: 2014-08-01 | Disposition: A | Discharge status: Home / Self Care | Payer: Medicare Other | Source: Ambulatory Visit | Attending: Internal Medicine | Admitting: Internal Medicine

## 2014-08-01 DIAGNOSIS — R531 Weakness: Secondary | ICD-10-CM

## 2014-08-06 ENCOUNTER — Ambulatory Visit (INDEPENDENT_AMBULATORY_CARE_PROVIDER_SITE_OTHER): Payer: Medicare Other | Admitting: Neurology

## 2014-08-06 ENCOUNTER — Encounter: Payer: Self-pay | Admitting: Neurology

## 2014-08-06 VITALS — BP 98/57 | HR 108

## 2014-08-06 DIAGNOSIS — R531 Weakness: Secondary | ICD-10-CM

## 2014-08-06 DIAGNOSIS — M6289 Other specified disorders of muscle: Secondary | ICD-10-CM

## 2014-08-06 DIAGNOSIS — S72002S Fracture of unspecified part of neck of left femur, sequela: Secondary | ICD-10-CM

## 2014-08-06 DIAGNOSIS — I482 Chronic atrial fibrillation, unspecified: Secondary | ICD-10-CM

## 2014-08-06 DIAGNOSIS — R269 Unspecified abnormalities of gait and mobility: Secondary | ICD-10-CM

## 2014-08-06 NOTE — Progress Notes (Signed)
PATIENT: Erik Arnold DOB: 05/06/1924  HISTORICAL  Erik Arnold is a 80 years old right-handed male, nursing home resident, alone at today's visit. His primary care physician is Dr. Mayford Knife.  He was never married, has no children, he make his own medical decision, his MMSE is 45/30, he has a younger sister, [name]  who is involved in his care.  He had a past medical history of chronic atrial fibrillation,on chronic Coumadin treatment, also reported previous history of TIA, with worsening left-sided weakness in December 2013, he presented to emergency room at that time, I was able to review MRI scan in December 24th  2013, there was no new stroke, chronic small vessel disease, evidence of previous left coronal radiata stroke.  He retired at age 41 from [employer] work, lives alone at home until he fell and broke his left hip in September 21st 2015, he had left hip intertrochanteric fracture, open reduction and internal fixation, was discharged to nursing home ever since. But he had a profound left leg weakness, could no longer lifted up against gravity despite there was no longer significant left hip pain, he now has Foley catheter ,also with worsening left arm weakness.  He reported long-standing history of mild left-sided weakness, reported in early 1980s,after strenuous activity, he felt sudden onset mild left arm, and leg weakness, which has been persistent, he actually quit working because of his left side difficulty, since 1980s, over the years, it gradually getting worse, prior to his fell, and admission to hospital, he was using a walker, reported frequent nocturnal urination,but denies incontinence.  UPDATE Dec 7th 2015; He is alone at today's clinical visit, we have reviewed MRI of the brain: Moderate perisylvian and mild diffuse atrophy. Left frontal periventricular round T2 hyperintensity, possibly chronic lacunar infarct vs other autoimmune/inflammatory etiology. Mild  periventricular and subcortical foci of non-specific T2 hyperintensities, may represent chronic small vessel ischemic disease vs other autoimmune/inflammatory etiology. No change from MRI on 08/23/12.  MRI of cervical showed multilevel degenerative disc disease, the spinal cord is notable for three T2 hyperintense spinal cord lesions at C2 (posterior and left), C3 (posterior and central) and C6 (left laterally).   MRI of lumbar showed Moderately severe bilateral foraminal stenosis at L4-5 which could affect L4 nerves. This is due to facet hypertrophy. Left worse than right  He reported left-sided weakness which has been persistent since its onset in 1980s, he denies relapsing remitting course, he denies visual change  Laboratory evaluation showed anemia hemoglobin 8.5, he is on chronic Coumadin treatment because of atrial fibrillation   REVIEW OF SYSTEMS: Full 14 system review of systems performed and notable only for As above  ALLERGIES: No Known Allergies  HOME MEDICATIONS: Current Outpatient Prescriptions on File Prior to Visit  Medication Sig Dispense Refill  . bisacodyl (DULCOLAX) 5 MG EC tablet Take 5 mg by mouth daily as needed for moderate constipation.    . docusate sodium 100 MG CAPS Take 100 mg by mouth 2 (two) times daily. 10 capsule 0  . finasteride (PROSCAR) 5 MG tablet Take 1 tablet (5 mg total) by mouth daily.    Marland Kitchen HYDROcodone-acetaminophen (NORCO/VICODIN) 5-325 MG per tablet Take 1-2 tablets by mouth every 4 (four) hours as needed for moderate pain. 60 tablet 0  . iron polysaccharides (NIFEREX) 150 MG capsule Take 150 mg by mouth daily.    . metoprolol tartrate (LOPRESSOR) 25 MG tablet Take 0.5 tablets (12.5 mg total) by mouth 2 (two) times daily. 30  tablet 11  . polyethylene glycol (MIRALAX / GLYCOLAX) packet Take 17 g by mouth daily. 14 each 0  . senna (SENOKOT) 8.6 MG TABS tablet Take 1 tablet by mouth daily.    . tamsulosin (FLOMAX) 0.4 MG CAPS capsule Take 1 capsule  (0.4 mg total) by mouth daily after breakfast. 30 capsule   . warfarin (COUMADIN) 4 MG tablet Take 4 mg by mouth as directed.      No current facility-administered medications on file prior to visit.    PAST MEDICAL HISTORY: Past Medical History  Diagnosis Date  . Stroke 08/22/2012    residual left sided weakness (08/23/2012)  . TIA (transient ischemic attack)   . Carpal tunnel syndrome     diagnosed in 1997  . Chronic atrial fibrillation     Not on coumadin as he was deemed a fall risk after he fell in his cardiologists office.    PAST SURGICAL HISTORY: Past Surgical History  Procedure Laterality Date  . Inguinal hernia repair  ~ 1992    "left" (08/23/2012)  . Cataract extraction w/ intraocular lens  implant, bilateral  ~ 2005  . Proctosigmoidoscopy      November 2001 normal screening proctocolonoscopy to the cecum   . Intramedullary (im) nail intertrochanteric Left 05/21/2014    Procedure: LEFT INTRAMEDULLARY (IM) NAIL INTERTROCHANTRIC;  Surgeon: Cammy Copa, MD;  Location: [facility] OR;  Service: Orthopedics;  Laterality: Left;    FAMILY HISTORY: Family History  Problem Relation Age of Onset  . CVA Mother   . Pneumonia Father     SOCIAL HISTORY:  History   Social History  . Marital Status: Unknown    Spouse Name: N/A    Number of Children: 0  . Years of Education: 12   Occupational History    Retired   Social History Main Topics  . Smoking status: Former Smoker -- 5 years    Types: Pipe  . Smokeless tobacco: Former Neurosurgeon    Types: Chew     Comment: 08/23/2012 "smoked a little pipe in my 20's; haven't chewed in ~ 46month"  . Alcohol Use: No     Comment: 08/23/2012 "last alcohol was a beer in 1980; never had problems w/it"  . Drug Use: No  . Sexual Activity: No   Other Topics Concern  . Not on file   Social History Narrative   Patient lives at [facility].   Retired.   Education high school.   Right handed.   Caffeine two cups of tea daily.       PHYSICAL EXAM   Filed Vitals:   08/06/14 0909  BP: 98/57  Pulse: 108    Not recorded      Cannot calculate BMI with a height equal to zero.   Generalized: In no acute distress  Neck: Supple, no carotid bruits   Cardiac: irregular rate rhythm  Pulmonary: Clear to auscultation bilaterally  Musculoskeletal: No deformity  Neurological examination  Mentation: Alert oriented to time, place, history taking, and causual conversation, MMSE is 28 /30, he is not oriented to place, mild difficulty spell world backwards  Cranial nerve II-XII: Pupils were equal round reactive to light. Extraocular movements were full.  Visual field were full on confrontational test. Facial sensation and strength were normal. Hearing was intact to finger rubbing bilaterally. Uvula tongue midline.  Head turning and shoulder shrug and were normal and symmetric.Tongue protrusion into cheek strength was normal.  Motor: he has right hand intrinsic hand muscle atrophy,  mild right finger abduction weakness, left upper extremity proximal and distal strength is 3, left lower extremity proximal and distal is 1, right leg has no significant weakness.  Sensory: length dependent decreased fine touch, pinprick at bilateral lower extremities.  Coordination: Normal finger to nose, heel-to-shin of right side Gait: deferred Deep tendon reflexes: hyperreflexia of left arm, and left leg  DIAGNOSTIC DATA (LABS, IMAGING, TESTING) - I reviewed patient records, labs, notes, testing and imaging myself where available.  Lab Results  Component Value Date   WBC 11.3* 06/03/2014   HGB 8.5* 06/03/2014   HCT 25.2* 06/03/2014   MCV 108.6* 06/03/2014   PLT 423* 06/03/2014      Component Value Date/Time   NA 139 06/03/2014 0810   K 3.8 06/03/2014 0810   CL 106 06/03/2014 0810   CO2 25 06/03/2014 0810   GLUCOSE 101* 06/03/2014 0810   BUN 14 06/03/2014 0810   CREATININE 0.69 06/03/2014 0810   CALCIUM 8.1* 06/03/2014  0810   PROT 5.4* 06/03/2014 0810   ALBUMIN 2.4* 06/03/2014 0810   AST 17 06/03/2014 0810   ALT 12 06/03/2014 0810   ALKPHOS 73 06/03/2014 0810   BILITOT 0.5 06/03/2014 0810   GFRNONAA 82* 06/03/2014 0810   GFRAA >90 06/03/2014 0810   Lab Results  Component Value Date   CHOL 129 08/23/2012   HDL 44 08/23/2012   LDLCALC 74 08/23/2012   TRIG 55 08/23/2012   CHOLHDL 2.9 08/23/2012   Lab Results  Component Value Date   HGBA1C 5.0 08/23/2012   Lab Results  Component Value Date   VITAMINB12 365 08/24/2012   Lab Results  Component Value Date   TSH 3.171 08/24/2012   ASSESSMENT AND PLAN  Tawny HoppingOren Soria is a 80 y.o. male with left side weakness, involving his left arm and leg since 1980s, abnormal MRI cervical, brain findings, he denies a clear history of relapsing remitting, the imaging findings and long-standing history consistent with primary progressive multiple sclerosis, also superimposed bilateral lumbosacral radiculopathy, left worse than right.  1, laboratory evaluation to rule out treatable etiology 2. Continue rehabilitation 3 return to clinic in 3-4 months.     Levert FeinsteinYijun Camillia Marcy, M.D. Ph.D.  [facility] [address], [address] [place], [place] [address] [phone]

## 2014-08-08 LAB — IFE AND PE, SERUM
ALPHA2 GLOB SERPL ELPH-MCNC: 0.9 g/dL (ref 0.4–1.2)
Albumin SerPl Elph-Mcnc: 2.8 g/dL — ABNORMAL LOW (ref 3.2–5.6)
Albumin/Glob SerPl: 0.9 (ref 0.7–2.0)
Alpha 1: 0.3 g/dL (ref 0.1–0.4)
B-Globulin SerPl Elph-Mcnc: 1.1 g/dL (ref 0.6–1.3)
Gamma Glob SerPl Elph-Mcnc: 1 g/dL (ref 0.5–1.6)
Globulin, Total: 3.2 g/dL (ref 2.0–4.5)
IGG (IMMUNOGLOBIN G), SERUM: 1164 mg/dL (ref 700–1600)
IGM (IMMUNOGLOBULIN M), SRM: 79 mg/dL (ref 40–230)
IgA/Immunoglobulin A, Serum: 406 mg/dL (ref 91–414)
M PROTEIN SERPL ELPH-MCNC: 0.3 g/dL — AB
Total Protein: 6 g/dL (ref 6.0–8.5)

## 2014-08-08 LAB — THYROID PANEL WITH TSH
Free Thyroxine Index: 1.9 (ref 1.2–4.9)
T3 UPTAKE RATIO: 36 % (ref 24–39)
T4, Total: 5.3 ug/dL (ref 4.5–12.0)
TSH: 3.79 u[IU]/mL (ref 0.450–4.500)

## 2014-08-08 LAB — CK: CK TOTAL: 55 U/L (ref 24–204)

## 2014-08-08 LAB — IRON AND TIBC
IRON SATURATION: 13 % — AB (ref 15–55)
IRON: 28 ug/dL — AB (ref 40–155)
TIBC: 221 ug/dL — ABNORMAL LOW (ref 250–450)
UIBC: 193 ug/dL (ref 150–375)

## 2014-08-08 LAB — C-REACTIVE PROTEIN: CRP: 92.5 mg/L — ABNORMAL HIGH (ref 0.0–4.9)

## 2014-08-08 LAB — LYME, TOTAL AB TEST/REFLEX: Lyme IgG/IgM Ab: <0.91 {ISR} (ref 0.00–0.90)

## 2014-08-08 LAB — FOLATE

## 2014-08-08 LAB — FERRITIN: Ferritin: 212 ng/mL (ref 30–400)

## 2014-08-08 LAB — VITAMIN B12: Vitamin B-12: 940 pg/mL (ref 211–946)

## 2014-08-08 LAB — ANA W/REFLEX IF POSITIVE: ANA: NEGATIVE

## 2014-08-08 LAB — SEDIMENTATION RATE: Sed Rate: 75 mm/hr — ABNORMAL HIGH (ref 0–30)

## 2014-08-10 ENCOUNTER — Telehealth: Payer: Self-pay | Admitting: Neurology

## 2014-08-10 NOTE — Telephone Encounter (Signed)
I have called and failed to reach patient,  ESR 75, CRP 92.5  Was elevated, I have faxed the result to his PCP Dr. Radford Pax,   Also give him a follow up appt to my next available, repeat labs.

## 2014-08-13 NOTE — Telephone Encounter (Signed)
Called [facility] and spoke to [employer]. Ander Slade). And scheduled patient a follow with Dr.Yan this coming Friday.  Dr.Yan patient's  Sister want's you to call her after 3:00 today.

## 2014-08-17 ENCOUNTER — Ambulatory Visit: Payer: Self-pay | Admitting: Neurology

## 2014-09-05 ENCOUNTER — Encounter: Payer: Self-pay | Admitting: Cardiology

## 2014-09-10 ENCOUNTER — Telehealth: Payer: Self-pay | Admitting: Cardiology

## 2014-09-10 NOTE — Telephone Encounter (Signed)
Patient is stable from cardiac standpoint for urologic surgery.  He has no CP or SOB.  He has chronic rate controlled afib.  2D echo with normal LVF.  He has a history of CVA and TIA so with chronic afib would recommend Lovenox bridge while holding coumadin.  Please refer to coumadin clinic for instructions and forward to Dr. Mena Goes.

## 2014-09-11 ENCOUNTER — Other Ambulatory Visit: Payer: Self-pay | Admitting: Urology

## 2014-09-11 NOTE — Telephone Encounter (Signed)
Printed and placed in "to be faxed" bin in Medical Records.  Forwarded to Coumadin Clinic for bridging.

## 2014-09-11 NOTE — Telephone Encounter (Signed)
Spoke with pt's sister.  Pt resides at [facility].  She suggested we talk with Caitlyn as she has taken care of all of Mr. Kulkarni's issues for her.  She works daily from 7am to [employer].  Will try to reach her tomorrow at [phone].

## 2014-09-12 ENCOUNTER — Telehealth: Payer: Self-pay | Admitting: Cardiology

## 2014-09-12 NOTE — Telephone Encounter (Signed)
Received request from Nurse fax box, documents faxed for surgical clearance. To: [facility] Fax number: [phone] Attention: [phone]/km

## 2014-09-12 NOTE — Telephone Encounter (Addendum)
Spoke with Caitlyn at [facility].  She will discuss with Dr. Jarold Motto and see if he is comfortable managing Lovenox.  If not, we will give orders but will need most recent weight, SCr and INR readings.  She will let us know tomorrow.

## 2014-09-17 NOTE — Telephone Encounter (Signed)
Spoke with Caitlyn at [facility].  She discussed with Dr. Jarold Motto and he has given orders for Lovenox bridge.

## 2014-09-24 ENCOUNTER — Encounter (HOSPITAL_COMMUNITY): Payer: Self-pay | Admitting: *Deleted

## 2014-09-25 ENCOUNTER — Encounter (HOSPITAL_COMMUNITY): Payer: Self-pay | Admitting: *Deleted

## 2014-09-25 NOTE — Progress Notes (Addendum)
                 Your procedure is scheduled on: Tuesday, October 02, 2014  Report to [facility] at AM. 0800  Spoke with Clement Husbands Rn at [facility] [phone] fax number [phone] the pre op instructions were reviewed by telephone.  Yvonna Alanis was told that the instructions would be faxed today to the  facility. A follow-up telephone call to confirm that the informations were received and are clear.    Call this number if you have problems the morning of surgery: [phone]    Remember: Bring insurance card and picture ID, PLEASE SEND CURRENT MEDICATION LIST AND TIME LAST MEDICATIONS TAKEN  WITH PATIENT MORNING OF SURGERY .  Do not drink liquids or  eat food:After Midnight. Monday night, February 01,2016     Take these medicines the morning of surgery with A SIP OF WATER:Metoprolol with  A sip of water May take Hydrocodone if needed for pain  Special order to take Keflex 500 mg TID 3 days before procedure starting 09-29-14                                  SEE [place] PREPARING FOR SURGERY SHEET     Do not wear jewelry, make-up or nail polish.  Do not wear lotions, powders, or perfumes. You may wear deodorant.             Men may shave face and neck.  Do not bring valuables to the hospital.  Contacts, dentures or bridgework may not be worn into surgery.  Leave suitcase in the car. After surgery it may be brought to your room.  For patients admitted to the hospital, checkout time is 11:00 AM the day of discharge.   Patients discharged the day of surgery will not be allowed to drive home.  Name and phone number of your driver: [facility] will provide the  Transportation  [phone]                     Call Jolyn Nap, RN pre op nurse if needed [phone]    Main Number [phone]              FAILURE TO FOLLOW THESE INSTRUCTIONS MAY RESULT IN THE CANCELLATION OF YOUR SURGERY.     PATIENT    SIGNATURE___________________________________________________

## 2014-10-01 NOTE — H&P (Signed)
Reason For Visit Seen today for a pre-op exam.   Active Problems Problems  1. BPH (benign prostatic hypertrophy) with urinary obstruction (N40.1)   Assessed By: Jetta Lout (Urology); Last Assessed: 25 Sep 2014 2. Preop examination (R74.081)   Assessed By: Jetta Lout (Urology); Last Assessed: 25 Sep 2014  History of Present Illness 81 YO male patient of Dr. Estil Daft seen today for a pre-op visit. Scheduled for Greenlight laser TUR-P 10/02/14.    GU Hx:            F/u - PCP Dr. Eloise Harman.     1- urinary retention - Oct 2015 - pt was seen earlier this month as hospital consult. He underwent hip fx repair and was in a nursing home. He developed LE edema and was noted to be in ARF with a Cr of 10L. A foley was placed and drained > 1L. His Cr improved to 0.9 over a few days. He had a normal DRE, but a large prostate on exam. He was started on tamsulosin and finasteride. His GU hx is unclear. Patient is getting his strength back but still has left lower extremity issues. Nonambulatory.  -Nov 2015 void trail - foley removed in AM, pt f/u later and could not void. PVR 410 mL. Foley placed and drained about 500 ml. Using a walker.     2-BPH - associated with urinary retention Oct 2015. He was started on tamsulosin and finasteride.    3-PCa screening - Oct 2015 - normal DRE    PMH - 9/21-9/24/15 for L hip #, Afib CHad2Vasc2 score~4 not on Coumadin 2/2 to falls, prior TIA 07/2012    Dec 2015 interval hx  Patient returns and is getting stronger. He can't walk but can transfer. No fever or bladder pain.     Jan 2016 Interval Hx:   Denies any f/c, CP, SOB. Foley is draining appropriately.   Past Medical History Problems  1. History of Dyspnea (R06.00) 2. History of acute renal failure (Z87.448) 3. History of congestive heart failure (Z86.79) 4. History of esophageal reflux (Z87.19) 5. History of hyperkalemia (Z86.39) 6. 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  History of stroke (Z86.73) 8. History of transient cerebral ischemia (Z86.73)  Current Meds 1. Bisacodyl 5 MG TBEC;  Therapy: (Recorded:26Jan2016) to Recorded 2. Cephalexin 500 MG Oral Capsule; TAKE 1 CAPSULE 3 TIMES DAILY;  Therapy: 03Jan2016 to (Evaluate:09Jan2016); Last Rx:04Jan2016 Ordered 3. Colace CAPS;  Therapy: (Recorded:23Dec2015) to Recorded 4. Coumadin TABS;  Therapy: (Recorded:28Oct2015) to Recorded 5. Digoxin TABS;  Therapy: (Recorded:28Oct2015) to Recorded 6. Finasteride 5 MG Oral Tablet;  Therapy: (Recorded:28Oct2015) to Recorded 7. Flomax 0.4 MG CP24;  Therapy: (Recorded:26Jan2016) to Recorded 8. Iron TABS;  Therapy: (Recorded:28Oct2015) to Recorded 9. Lasix 40 MG Oral Tablet;  Therapy: (Recorded:23Dec2015) to Recorded 10. Losartan Potassium 50 MG Oral Tablet;   Therapy: (Recorded:23Dec2015) to Recorded 11. Lovenox 60 MG/0.6ML Subcutaneous Solution;   Therapy: (Recorded:26Jan2016) to Recorded 12. Metoprolol Tartrate 25 MG Oral Tablet;   Therapy: (Recorded:26Jan2016) to Recorded 13. MiraLax PACK;   Therapy: (Recorded:26Jan2016) to Recorded 14. Norco 5-325 MG Oral Tablet;   Therapy: (Recorded:28Oct2015) to Recorded 15. Prostate SR CAPS;   Therapy: (Recorded:26Jan2016) to Recorded 16. Senna CAPS;   Therapy: (Recorded:28Oct2015) to Recorded  Allergies Medication  1. No Known Drug Allergies  Family History Problems  1. No pertinent family history : Father  Social History Problems  1. Former smoker [phone]) 2. Former smoker [phone])  Review of Systems Genitourinary, constitutional, skin, eye, otolaryngeal, hematologic/lymphatic,  cardiovascular, pulmonary, endocrine, musculoskeletal, gastrointestinal, neurological and psychiatric system(s) were reviewed and pertinent findings if present are noted and are otherwise negative.  Neurological: difficulty walking.    Vitals Vital Signs [Data Includes: Last 1 Day]  Recorded: 26Jan2016 09:55AM  Height: 5 ft  8 in Weight: 171 lb  BMI Calculated: 26 BSA Calculated: 1.91 Blood Pressure: 98 / 61 Temperature: 97.5 F Heart Rate: 118  Physical Exam Constitutional: Well nourished and well developed . No acute distress. The patient appears well hydrated.  ENT:. The ears and nose are normal in appearance.  Neck: The appearance of the neck is normal.  Pulmonary: normal respiratory rhythm and effort. Heard on ascultation (Bilaterally course).  Cardiovascular: Heart rate and rhythm are normal.  Abdomen: The abdomen is obese. The abdomen is soft and nontender. No suprapubic tenderness. No CVA tenderness.  Genitourinary: Examination of the penis demonstrates an indwelling catheter.  Skin: Normal skin turgor and normal skin color and pigmentation.  Neuro/Psych:. Mood and affect are appropriate.    Assessment Assessed  1. BPH (benign prostatic hypertrophy) with urinary obstruction (N40.1) 2. Preop examination (Z01.818)  Plan At this time he is cleared to proceed with upcoming Greenlight Laser TUR-P 10/02/14 w/Dr. Mena Goes   Signatures Electronically signed by : Jetta Lout, Dyann Ruddle; Sep 25 2014 10:23AM EST   ADD: Urine Cx from Dec 2015 was + (pan-sensitive e coli) and I started pt on cephalexin po pre-op.

## 2014-10-02 ENCOUNTER — Ambulatory Visit (HOSPITAL_COMMUNITY): Payer: Medicare Other | Admitting: Certified Registered Nurse Anesthetist

## 2014-10-02 ENCOUNTER — Ambulatory Visit (HOSPITAL_COMMUNITY)
Admission: RE | Admit: 2014-10-02 | Discharge: 2014-10-02 | Disposition: A | Discharge status: Skilled Nursing Facility | Payer: Medicare Other | Source: Ambulatory Visit | Attending: Urology | Admitting: Urology

## 2014-10-02 ENCOUNTER — Encounter (HOSPITAL_COMMUNITY): Payer: Self-pay | Admitting: Certified Registered Nurse Anesthetist

## 2014-10-02 ENCOUNTER — Encounter (HOSPITAL_COMMUNITY)
Admission: RE | Disposition: A | Discharge status: Skilled Nursing Facility | Payer: Self-pay | Source: Ambulatory Visit | Attending: Urology

## 2014-10-02 DIAGNOSIS — N401 Enlarged prostate with lower urinary tract symptoms: Secondary | ICD-10-CM | POA: Insufficient documentation

## 2014-10-02 DIAGNOSIS — R338 Other retention of urine: Secondary | ICD-10-CM | POA: Diagnosis not present

## 2014-10-02 DIAGNOSIS — Z87891 Personal history of nicotine dependence: Secondary | ICD-10-CM | POA: Insufficient documentation

## 2014-10-02 DIAGNOSIS — I4891 Unspecified atrial fibrillation: Secondary | ICD-10-CM | POA: Diagnosis not present

## 2014-10-02 DIAGNOSIS — I509 Heart failure, unspecified: Secondary | ICD-10-CM | POA: Insufficient documentation

## 2014-10-02 DIAGNOSIS — Z8673 Personal history of transient ischemic attack (TIA), and cerebral infarction without residual deficits: Secondary | ICD-10-CM | POA: Insufficient documentation

## 2014-10-02 DIAGNOSIS — N289 Disorder of kidney and ureter, unspecified: Secondary | ICD-10-CM | POA: Insufficient documentation

## 2014-10-02 DIAGNOSIS — Z79899 Other long term (current) drug therapy: Secondary | ICD-10-CM | POA: Insufficient documentation

## 2014-10-02 DIAGNOSIS — N138 Other obstructive and reflux uropathy: Secondary | ICD-10-CM

## 2014-10-02 DIAGNOSIS — K219 Gastro-esophageal reflux disease without esophagitis: Secondary | ICD-10-CM | POA: Diagnosis not present

## 2014-10-02 HISTORY — PX: GREEN LIGHT LASER TURP (TRANSURETHRAL RESECTION OF PROSTATE: SHX6260

## 2014-10-02 HISTORY — DX: Chronic kidney disease, unspecified: N18.9

## 2014-10-02 HISTORY — DX: Cardiac arrhythmia, unspecified: I49.9

## 2014-10-02 LAB — CBC
HCT: 36 % — ABNORMAL LOW (ref 39.0–52.0)
Hemoglobin: 11.7 g/dL — ABNORMAL LOW (ref 13.0–17.0)
MCH: 34.9 pg — ABNORMAL HIGH (ref 26.0–34.0)
MCHC: 32.5 g/dL (ref 30.0–36.0)
MCV: 107.5 fL — AB (ref 78.0–100.0)
PLATELETS: 390 10*3/uL (ref 150–400)
RBC: 3.35 MIL/uL — AB (ref 4.22–5.81)
RDW: 16.3 % — ABNORMAL HIGH (ref 11.5–15.5)
WBC: 10.7 10*3/uL — ABNORMAL HIGH (ref 4.0–10.5)

## 2014-10-02 LAB — BASIC METABOLIC PANEL
Anion gap: 7 (ref 5–15)
BUN: 26 mg/dL — ABNORMAL HIGH (ref 6–23)
CALCIUM: 9 mg/dL (ref 8.4–10.5)
CHLORIDE: 103 mmol/L (ref 96–112)
CO2: 27 mmol/L (ref 19–32)
Creatinine, Ser: 0.85 mg/dL (ref 0.50–1.35)
GFR calc Af Amer: 88 mL/min — ABNORMAL LOW (ref >90)
GFR, EST NON AFRICAN AMERICAN: 76 mL/min — AB (ref >90)
GLUCOSE: 102 mg/dL — AB (ref 70–99)
Potassium: 3.8 mmol/L (ref 3.5–5.1)
SODIUM: 137 mmol/L (ref 135–145)

## 2014-10-02 LAB — PROTIME-INR
INR: 1.02 (ref 0.00–1.49)
Prothrombin Time: 13.5 seconds (ref 11.6–15.2)

## 2014-10-02 LAB — APTT: aPTT: 30 seconds (ref 24–37)

## 2014-10-02 SURGERY — GREEN LIGHT LASER TURP (TRANSURETHRAL RESECTION OF PROSTATE
Anesthesia: General

## 2014-10-02 MED ORDER — METOPROLOL TARTRATE 12.5 MG HALF TABLET
12.5000 mg | ORAL_TABLET | Freq: Once | ORAL | Status: AC
  Administered 2014-10-02: 12.5 mg via ORAL
  Filled 2014-10-02: qty 1

## 2014-10-02 MED ORDER — LIDOCAINE HCL (CARDIAC) 20 MG/ML IV SOLN
INTRAVENOUS | Status: DC | PRN
  Administered 2014-10-02: 50 mg via INTRAVENOUS

## 2014-10-02 MED ORDER — ENOXAPARIN SODIUM 60 MG/0.6ML ~~LOC~~ SOLN: 60.0000 mg | SUBCUTANEOUS | Status: DC

## 2014-10-02 MED ORDER — CEFAZOLIN SODIUM-DEXTROSE 2-3 GM-% IV SOLR
INTRAVENOUS | Status: AC
  Filled 2014-10-02: qty 50

## 2014-10-02 MED ORDER — SODIUM CHLORIDE 0.9 % IJ SOLN
INTRAMUSCULAR | Status: AC
  Filled 2014-10-02: qty 10

## 2014-10-02 MED ORDER — SODIUM CHLORIDE 0.9 % IR SOLN
Status: DC | PRN
  Administered 2014-10-02: 15000 mL via INTRAVESICAL

## 2014-10-02 MED ORDER — DEXAMETHASONE SODIUM PHOSPHATE 10 MG/ML IJ SOLN
INTRAMUSCULAR | Status: DC | PRN
  Administered 2014-10-02: 8 mg via INTRAVENOUS

## 2014-10-02 MED ORDER — PROPOFOL 10 MG/ML IV BOLUS
INTRAVENOUS | Status: AC
  Filled 2014-10-02: qty 20

## 2014-10-02 MED ORDER — EPHEDRINE SULFATE 50 MG/ML IJ SOLN
INTRAMUSCULAR | Status: AC
  Filled 2014-10-02: qty 1

## 2014-10-02 MED ORDER — LACTATED RINGERS IV SOLN
INTRAVENOUS | Status: DC
  Administered 2014-10-02: 12:00:00 via INTRAVENOUS
  Administered 2014-10-02: 1000 mL via INTRAVENOUS

## 2014-10-02 MED ORDER — BELLADONNA ALKALOIDS-OPIUM 16.2-60 MG RE SUPP
RECTAL | Status: DC | PRN
  Administered 2014-10-02: 1 via RECTAL

## 2014-10-02 MED ORDER — CEFAZOLIN SODIUM-DEXTROSE 2-3 GM-% IV SOLR
2.0000 g | INTRAVENOUS | Status: AC
  Administered 2014-10-02: 2 g via INTRAVENOUS

## 2014-10-02 MED ORDER — ATROPINE SULFATE 0.4 MG/ML IJ SOLN
INTRAMUSCULAR | Status: AC
  Filled 2014-10-02: qty 1

## 2014-10-02 MED ORDER — LIDOCAINE HCL 2 % EX GEL
CUTANEOUS | Status: AC
  Filled 2014-10-02: qty 10

## 2014-10-02 MED ORDER — SODIUM CHLORIDE 0.9 % IV SOLN
10.0000 mg | INTRAVENOUS | Status: DC | PRN
  Administered 2014-10-02: 40 ug/min via INTRAVENOUS

## 2014-10-02 MED ORDER — FENTANYL CITRATE 0.05 MG/ML IJ SOLN
INTRAMUSCULAR | Status: AC
  Filled 2014-10-02: qty 2

## 2014-10-02 MED ORDER — WARFARIN SODIUM 4 MG PO TABS: 4.0000 mg | ORAL_TABLET | ORAL | Status: DC

## 2014-10-02 MED ORDER — ONDANSETRON HCL 4 MG/2ML IJ SOLN
INTRAMUSCULAR | Status: AC
Start: 2014-10-02 — End: 2014-10-02
  Filled 2014-10-02: qty 2

## 2014-10-02 MED ORDER — FENTANYL CITRATE 0.05 MG/ML IJ SOLN: 25.0000 ug | INTRAMUSCULAR | Status: DC | PRN

## 2014-10-02 MED ORDER — ACETAMINOPHEN 10 MG/ML IV SOLN
1000.0000 mg | Freq: Once | INTRAVENOUS | Status: AC
  Administered 2014-10-02: 1000 mg via INTRAVENOUS
  Filled 2014-10-02: qty 100

## 2014-10-02 MED ORDER — PROPOFOL 10 MG/ML IV BOLUS
INTRAVENOUS | Status: DC | PRN
  Administered 2014-10-02: 90 mg via INTRAVENOUS

## 2014-10-02 MED ORDER — FENTANYL CITRATE 0.05 MG/ML IJ SOLN
INTRAMUSCULAR | Status: DC | PRN
  Administered 2014-10-02 (×2): 25 ug via INTRAVENOUS

## 2014-10-02 MED ORDER — WARFARIN SODIUM 1 MG PO TABS: 1.0000 mg | ORAL_TABLET | Freq: Every day | ORAL | Status: DC

## 2014-10-02 MED ORDER — ONDANSETRON HCL 4 MG/2ML IJ SOLN
INTRAMUSCULAR | Status: DC | PRN
  Administered 2014-10-02 (×2): 2 mg via INTRAVENOUS

## 2014-10-02 MED ORDER — PHENYLEPHRINE HCL 10 MG/ML IJ SOLN
INTRAMUSCULAR | Status: AC
  Filled 2014-10-02: qty 1

## 2014-10-02 MED ORDER — BELLADONNA ALKALOIDS-OPIUM 16.2-60 MG RE SUPP
RECTAL | Status: AC
  Filled 2014-10-02: qty 1

## 2014-10-02 MED ORDER — ONDANSETRON HCL 4 MG/2ML IJ SOLN: 4.0000 mg | Freq: Once | INTRAMUSCULAR | Status: DC | PRN

## 2014-10-02 SURGICAL SUPPLY — 15 items
BAG URINE DRAINAGE (UROLOGICAL SUPPLIES) ×3 IMPLANT
BAG URO CATCHER STRL LF (DRAPE) ×3 IMPLANT
CATH TIEMANN FOLEY 18FR 5CC (CATHETERS) IMPLANT
DRAPE CAMERA CLOSED 9X96 (DRAPES) ×3 IMPLANT
GLOVE BIOGEL M STRL SZ7.5 (GLOVE) ×3 IMPLANT
GOWN STRL REUS W/TWL XL LVL3 (GOWN DISPOSABLE) ×3 IMPLANT
HOLDER FOLEY CATH W/STRAP (MISCELLANEOUS) IMPLANT
LASER FIBER /GREENLIGHT LASER (Laser) ×3 IMPLANT
LASER GREENLIGHT RENTAL P/PROC (Laser) ×3 IMPLANT
MANIFOLD NEPTUNE II (INSTRUMENTS) ×3 IMPLANT
PACK CYSTO (CUSTOM PROCEDURE TRAY) ×3 IMPLANT
SYR 30ML LL (SYRINGE) IMPLANT
SYRINGE IRR TOOMEY STRL 70CC (SYRINGE) IMPLANT
TUBING CONNECTING 10 (TUBING) ×2 IMPLANT
TUBING CONNECTING 10' (TUBING) ×1

## 2014-10-02 NOTE — Discharge Instructions (Signed)
Foley Catheter Care A Foley catheter is a soft, flexible tube. This tube is placed into your bladder to drain pee (urine). If you go home with this catheter in place, follow the instructions below. TAKING CARE OF THE CATHETER  Wash your hands with soap and water.  Put soap and water on a clean washcloth.  Clean the skin where the tube goes into your body.  Clean away from the tube site.  Never wipe toward the tube.  Clean the area using a circular motion.  Remove all the soap. Pat the area dry with a clean towel. For males, reposition the skin that covers the end of the penis (foreskin).  Attach the tube to your leg with tape or a leg strap. Do not stretch the tube tight. If you are using tape, remove any stickiness left behind by past tape you used.  Keep the drainage bag below your hips. Keep it off the floor.  Check your tube during the day. Make sure it is working and draining. Make sure the tube does not curl, twist, or bend.  Do not pull on the tube or try to take it out. TAKING CARE OF THE DRAINAGE BAGS You will have a large overnight drainage bag and a small leg bag. You may wear the overnight bag any time. Never wear the small bag at night. Follow the directions below. Emptying the Drainage Bag Empty your drainage bag when it is  - full or at least 2-3 times a day.  Wash your hands with soap and water.  Keep the drainage bag below your hips.  Hold the dirty bag over the toilet or clean container.  Open the pour spout at the bottom of the bag. Empty the pee into the toilet or container. Do not let the pour spout touch anything.  Clean the pour spout with a gauze pad or cotton ball that has rubbing alcohol on it.  Close the pour spout.  Attach the bag to your leg with tape or a leg strap.  Wash your hands well. Changing the Drainage Bag Change your bag once a month or sooner if it starts to smell or look dirty.   Wash your hands with soap and water.  Pinch  the rubber tube so that pee does not spill out.  Disconnect the catheter tube from the drainage tube at the connection valve. Do not let the tubes touch anything.  Clean the end of the catheter tube with an alcohol wipe. Clean the end of a the drainage tube with a different alcohol wipe.  Connect the catheter tube to the drainage tube of the clean drainage bag.  Attach the new bag to the leg with tape or a leg strap. Avoid attaching the new bag too tightly.  Wash your hands well. Cleaning the Drainage Bag  Wash your hands with soap and water.  Wash the bag in warm, soapy water.  Rinse the bag with warm water.  Fill the bag with a mixture of white vinegar and water (1 cup vinegar to 1 quart warm water [.2 liter vinegar to 1 liter warm water]). Close the bag and soak it for 30 minutes in the solution.  Rinse the bag with warm water.  Hang the bag to dry with the pour spout open and hanging downward.  Store the clean bag (once it is dry) in a clean plastic bag.  Wash your hands well. PREVENT INFECTION  Wash your hands before and after touching your tube.  Take showers every day. Wash the skin where the tube enters your body. Do not take baths. Replace wet leg straps with dry ones, if this applies.  Do not use powders, sprays, or lotions on the genital area. Only use creams, lotions, or ointments as told by your doctor.  For females, wipe from front to back after going to the bathroom.  Drink enough fluids to keep your pee clear or pale yellow unless you are told not to have too much fluid (fluid restriction).  Do not let the drainage bag or tubing touch or lie on the floor.  Wear cotton underwear to keep the area dry. GET HELP IF:  Your pee is cloudy or smells unusually bad.  Your tube becomes clogged.  You are not draining pee into the bag or your bladder feels full.  Your tube starts to leak. GET HELP RIGHT AWAY IF:  You have pain, puffiness (swelling), redness,  or yellowish-white fluid (pus) where the tube enters the body.  You have pain in the belly (abdomen), legs, lower back, or bladder.  You have a fever.  You see blood fill the tube, or your pee is pink or red.  You feel sick to your stomach (nauseous), throw up (vomit), or have chills.  Your tube gets pulled out. MAKE SURE YOU:   Understand these instructions.  Will watch your condition.  Will get help right away if you are not doing well or get worse.   Prostate Laser Surgery, Care After Refer to this sheet in the next few weeks. These instructions provide you with information on caring for yourself after your procedure. Your health care provider may also give you more specific instructions. Your treatment has been planned according to current medical practices, but problems sometimes occur. Call your health care provider if you have any problems or questions after your procedure. WHAT TO EXPECT AFTER THE PROCEDURE  You may notice blood in your urine that could last up to 3 weeks.  After your catheter is removed, you will have burning (especially at the tip of your penis) when you urinate, especially at the end of urination. For the first few weeks after your procedure, you will feel the need to urinate often. HOME CARE INSTRUCTIONS   Do not perform vigorous exercise, especially heavy lifting, for 1 week or as directed by your health care provider.  Avoid sexual activity for 4-6 weeks or as directed by your health care provider.  Do not ride in a car for extended periods for 1 month or as directed by your health care provider.  Do not strain to have a bowel movement. Drink a lot of fluids and and make sure you get enough fiber in your diet.  Drink enough fluids to keep your urine clear or pale yellow. SEEK IMMEDIATE MEDICAL CARE IF:   Your catheter has been removed and you are suddenly unable to urinate.  Your catheter has not been removed, and it develops a blockage.  You  start to have blood clots in your urine.  The blood in your urine becomes persistent or gets thick.  Your temperature is greater than 100.23F (38.1C).  You develop chest pains.  You develop shortness of breath.  You develop leg swelling or pain. MAKE SURE YOU:  Understand these instructions.  Will watch your condition.  Will get help right away if you are not doing well or get worse. Document Released: 08/17/2005 Document Revised: 08/22/2013 Document Reviewed: 02/06/2013 [facility] Patient Information 2015 [employer]. This information is not intended to replace advice given to you by your health care provider. Make sure you discuss any questions you have with your health care provider.

## 2014-10-02 NOTE — Transfer of Care (Signed)
Immediate Anesthesia Transfer of Care Note  Patient: Erik Arnold  Procedure(s) Performed: Procedure(s): GREEN LIGHT LASER photo evaporization of prostate (N/A)  Patient Location: PACU  Anesthesia Type:General  Level of Consciousness: awake, alert , oriented and patient cooperative  Airway & Oxygen Therapy: Patient Spontanous Breathing and Patient connected to face mask oxygen  Post-op Assessment: Report given to RN, Post -op Vital signs reviewed and stable and Patient moving all extremities  Post vital signs: Reviewed and stable  Last Vitals:  Filed Vitals:   10/02/14 0813  BP: 125/77  Pulse: 83  Temp: 36.4 C  Resp: 16    Complications: No apparent anesthesia complications

## 2014-10-02 NOTE — Anesthesia Procedure Notes (Signed)
Procedure Name: LMA Insertion Date/Time: 10/02/2014 10:38 AM Performed by: Edison Pace Pre-anesthesia Checklist: Patient identified, Emergency Drugs available, Suction available, Patient being monitored and Timeout performed Patient Re-evaluated:Patient Re-evaluated prior to inductionOxygen Delivery Method: Circle system utilized Preoxygenation: Pre-oxygenation with 100% oxygen Intubation Type: IV induction LMA: LMA inserted LMA Size: 4.0 Number of attempts: 1 Placement Confirmation: positive ETCO2 Tube secured with: Tape Dental Injury: Teeth and Oropharynx as per pre-operative assessment

## 2014-10-02 NOTE — Interval H&P Note (Signed)
History and Physical Interval Note:  10/02/2014 10:23 AM  Erik Arnold  has presented today for surgery, with the diagnosis of   URINARY RETENTION, BENIGN PROSTATIC HYPERTROPHY  The various methods of treatment have been discussed with the patient and family. After consideration of risks, benefits and other options for treatment, the patient has consented to  Procedure(s): GREEN LIGHT LASER TURP (TRANSURETHRAL RESECTION OF PROSTATE (N/A) as a surgical intervention .  The patient's history has been reviewed, patient examined, no change in status, stable for surgery. Discussed likelihood of success.  I have reviewed the patient's chart and labs.  Questions were answered to the patient's satisfaction.  He elects to proceed.    Ariannah Arenson

## 2014-10-02 NOTE — Op Note (Addendum)
Preoperative diagnosis: BPH, urinary retention Postoperative diagnosis: Same  Procedure: Exam under anesthesia, Greenlight photo vaporization of the prostate  Surgeon: Chattie Greeson  Type of anesthesia: Gen.  Indication for procedure: Erik Arnold and 81 year old male who said urinary retention and failed several voiding trials. Cystoscopy revealed trilobar hypertrophy we discussed the nature risk benefits and alternatives to greenlight photo vaporization. Patient discussed with his family and they elected to proceed with outlet procedure.  Findings: On exam under anesthesia the glans had suffered some hypospadias back to the coronal sulcus. The penis was otherwise normal. The testicles were descended bilaterally and palpably normal. On digital rectal exam the prostate was enlarged but smooth without hard area or nodule.  Laser stats: Time: 14:51 Joules 89,032 Watts 80 -120   On cystoscopy there was trilobar hypertrophy. The anterior urethra was normal. The trigone and ureteral orifices were normal. The bladder was trabeculated but contain no stone or foreign body. The mucosa had some cystitis posteriorly from the catheter but no obvious or worrisome tumors.  Description of procedure: After consent was obtained patient brought to the operating room. After adequate anesthesia he is placed in lithotomy position and prepped and draped in the usual sterile fashion. An exam under anesthesia was performed. The cystoscope was passed per urethra and the bladder carefully examined. The laser bridge was then inserted. The ureteral orifices were identified again and I made incisions at 5:00 and 7:00 between the lateral median lobes and brought this down to the veru. Apical hockey incisions were made. The median lobe was then vaporized. I then went from anterior to posterior bladder neck down to the veru and then very back to the bladder neck vaporizing the lateral lobes. This created an excellent patent channel  with excellent hemostasis at low-pressure. The ureteral orifices again inspected and noted to be normal. I did leave some apical tissue adjacent to the veru. The bladder was refilled and the scope removed. An 28 French coud catheter was placed draining clear urine.  Complications: None Blood loss: Minimal Drains: 64 French coud catheter Specimens: None  Disposition: Patient stable to PACU

## 2014-10-02 NOTE — Anesthesia Preprocedure Evaluation (Signed)
Anesthesia Evaluation  Patient identified by MRN, date of birth, ID band Patient awake    Reviewed: Allergy & Precautions, NPO status , Patient's Chart, lab work & pertinent test results  History of Anesthesia Complications Negative for: history of anesthetic complications  Airway Mallampati: II  TM Distance: >3 FB Neck ROM: Full    Dental no notable dental hx. (+) Dental Advisory Given, Poor Dentition   Pulmonary former smoker,  breath sounds clear to auscultation  Pulmonary exam normal       Cardiovascular + dysrhythmias Atrial Fibrillation Rhythm:Regular Rate:Normal  Cardiac clearance: "Patient is stable from cardiac standpoint for urologic surgery. He has no CP or SOB. He has chronic rate controlled afib. 2D echo with normal LVF. He has a history of CVA and TIA so with chronic afib would recommend Lovenox bridge while holding coumadin. "   Neuro/Psych TIACVA (L sided weakness), Residual Symptoms negative psych ROS   GI/Hepatic negative GI ROS, Neg liver ROS,   Endo/Other  negative endocrine ROS  Renal/GU Renal disease  negative genitourinary   Musculoskeletal negative musculoskeletal ROS (+)   Abdominal   Peds negative pediatric ROS (+)  Hematology negative hematology ROS (+)   Anesthesia Other Findings   Reproductive/Obstetrics negative OB ROS                             Anesthesia Physical Anesthesia Plan  ASA: III  Anesthesia Plan: General   Post-op Pain Management:    Induction: Intravenous  Airway Management Planned: LMA  Additional Equipment:   Intra-op Plan:   Post-operative Plan: Extubation in OR  Informed Consent: I have reviewed the patients History and Physical, chart, labs and discussed the procedure including the risks, benefits and alternatives for the proposed anesthesia with the patient or authorized representative who has indicated his/her understanding  and acceptance.   Dental advisory given  Plan Discussed with: CRNA  Anesthesia Plan Comments:         Anesthesia Quick Evaluation

## 2014-10-02 NOTE — Anesthesia Postprocedure Evaluation (Signed)
  Anesthesia Post-op Note  Patient: Erik Arnold  Procedure(s) Performed: Procedure(s) (LRB): GREEN LIGHT LASER photo evaporization of prostate (N/A)  Patient Location: PACU  Anesthesia Type: General  Level of Consciousness: awake and alert   Airway and Oxygen Therapy: Patient Spontanous Breathing  Post-op Pain: mild  Post-op Assessment: Post-op Vital signs reviewed, Patient's Cardiovascular Status Stable, Respiratory Function Stable, Patent Airway and No signs of Nausea or vomiting  Last Vitals:  Filed Vitals:   10/02/14 1243  BP: 103/60  Pulse: 78  Temp: 36.4 C  Resp: 16    Post-op Vital Signs: stable   Complications: No apparent anesthesia complications

## 2014-10-03 ENCOUNTER — Encounter (HOSPITAL_COMMUNITY): Payer: Self-pay | Admitting: Urology

## 2014-11-05 ENCOUNTER — Ambulatory Visit (INDEPENDENT_AMBULATORY_CARE_PROVIDER_SITE_OTHER): Payer: Medicare Other | Admitting: Neurology

## 2014-11-05 ENCOUNTER — Encounter: Payer: Self-pay | Admitting: Neurology

## 2014-11-05 VITALS — BP 128/72 | HR 68

## 2014-11-05 DIAGNOSIS — G35 Multiple sclerosis: Secondary | ICD-10-CM | POA: Diagnosis not present

## 2014-11-05 MED ORDER — BACLOFEN 10 MG PO TABS: ORAL_TABLET | ORAL | Status: DC

## 2014-11-05 NOTE — Progress Notes (Signed)
PATIENT: Erik Arnold DOB: 1924-08-07  HISTORICAL  Erik Arnold is a 81 years old right-handed male, nursing home resident, alone at today's visit. His primary care physician is Dr. Radford Pax.   He was never married, has no children, he make his own medical decision, his MMSE is 61/30, he has a younger sister, [name]  who is involved in his care.  He had a past medical history of chronic atrial fibrillation,on chronic Coumadin treatment, also reported previous history of TIA, with worsening left-sided weakness since December 2013, he presented to emergency room at that time, I was able to review MRI scan in December 24th  2013, there was no new stroke, chronic small vessel disease, evidence of previous left coronal radiata stroke.  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 But he had a profound left leg weakness, could no longer lifted up against gravity despite there was no longer significant left hip pain, he now has Foley catheter ,also with worsening left arm weakness.  He reported long-standing history of mild left-sided weakness, reported in early 1980s,after strenuous activity, he felt sudden onset mild left arm, and leg weakness, which has been persistent, he actually quit working because of his left side difficulty, since 1980s, over the years, it gradually getting worse, prior to his fell, and admission to hospital, he was using a walker, reported frequent nocturnal urination,but denies incontinence.  UPDATE Dec 7th 2015; He is alone at today's clinical visit, we have reviewed MRI of the brain: Moderate perisylvian and mild diffuse atrophy. Left frontal periventricular round T2 hyperintensity, possibly chronic lacunar infarct vs other autoimmune/inflammatory etiology. Mild  periventricular and subcortical foci of non-specific T2 hyperintensities, may represent chronic small vessel ischemic disease vs other autoimmune/inflammatory etiology. No change from MRI on 08/23/12.  MRI of cervical showed multilevel degenerative disc disease, the spinal cord is notable for three T2 hyperintense spinal cord lesions at C2 (posterior and left), C3 (posterior and central) and C6 (left laterally).   MRI of lumbar showed Moderately severe bilateral foraminal stenosis at L4-5 which could affect L4 nerves. This is due to facet hypertrophy. Left worse than right  He reported left-sided weakness which has been persistent since its onset in 1980s, he denies relapsing remitting course, he denies visual change  Laboratory evaluation showed anemia hemoglobin 8.5, he is on chronic Coumadin treatment because of atrial fibrillation  UPDATE March 7th 2016: He was brought in by transportation,alone at today's visit, he is in wheelchair,   Laboratory evaluation in December 2015, showed significant elevated ESR, C-reactive protein, otherwise normal B12, mild anemia 11.7, normal BMP, ANA, CPK, folic acid.    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 Amino Acids-Protein Hydrolys (FEEDING SUPPLEMENT, PRO-STAT SUGAR FREE 64,) LIQD Take 30 mLs by mouth 2 (two) times daily.    . bisacodyl (DULCOLAX) 5 MG EC tablet Take 5 mg by mouth daily as needed for moderate constipation.    Marland Kitchen  docusate sodium (COLACE) 100 MG capsule Take 100 mg by mouth 2 (two) times daily.    Marland Kitchen docusate sodium 100 MG  CAPS Take 100 mg by mouth 2 (two) times daily. 10 capsule 0  . enoxaparin (LOVENOX) 60 MG/0.6ML injection Inject 0.6 mLs (60 mg total) into the skin daily. 0 Syringe   . finasteride (PROSCAR) 5 MG tablet Take 1 tablet (5 mg total) by mouth daily.    . furosemide (LASIX) 40 MG tablet Take 40 mg by mouth daily.    Marland Kitchen HYDROcodone-acetaminophen (NORCO/VICODIN) 5-325 MG per tablet Take 1-2 tablets by mouth every 4 (four) hours as needed for moderate pain. (Patient taking differently: Take 1 tablet by mouth every 4 (four) hours as needed for moderate pain. ) 60 tablet 0  . iron polysaccharides (NIFEREX) 150 MG capsule Take 150 mg by mouth daily.    Marland Kitchen losartan (COZAAR) 50 MG tablet Take 50 mg by mouth every morning.     . metoprolol tartrate (LOPRESSOR) 25 MG tablet Take 0.5 tablets (12.5 mg total) by mouth 2 (two) times daily. (Patient taking differently: Take 12.5 mg by mouth every morning. ) 30 tablet 11  . polyethylene glycol (MIRALAX / GLYCOLAX) packet Take 17 g by mouth daily. 14 each 0  . senna (SENOKOT) 8.6 MG TABS tablet Take 1 tablet by mouth daily.    . tamsulosin (FLOMAX) 0.4 MG CAPS capsule Take 1 capsule (0.4 mg total) by mouth daily after breakfast. 30 capsule   . warfarin (COUMADIN) 1 MG tablet Take 1 tablet (1 mg total) by mouth daily.    Marland Kitchen warfarin (COUMADIN) 4 MG tablet Take 1 tablet (4 mg total) by mouth as directed.     No current facility-administered medications on file prior to visit.    PAST MEDICAL HISTORY: Past Medical History  Diagnosis Date  . Stroke 08/22/2012    residual left sided weakness (08/23/2012)  . TIA (transient ischemic attack)   . Chronic atrial fibrillation     Not on coumadin as he was deemed a fall risk after he fell in his cardiologists office.  Marland Kitchen Dysrhythmia     Atrial Fib  . Carpal tunnel syndrome     diagnosed in 1997  . Chronic kidney disease     Urinary retention    PAST SURGICAL HISTORY: Past Surgical History  Procedure Laterality Date    . Inguinal hernia repair  ~ 1992    "left" (08/23/2012)  . Cataract extraction w/ intraocular lens  implant, bilateral  ~ 2005  . Proctosigmoidoscopy      November 2001 normal screening proctocolonoscopy to the cecum   . Intramedullary (im) nail intertrochanteric Left 05/21/2014    Procedure: LEFT INTRAMEDULLARY (IM) NAIL INTERTROCHANTRIC;  Surgeon: Meredith Pel, MD;  Location: [place];  Service: Orthopedics;  Laterality: Left;  Marland Kitchen Green light laser turp (transurethral resection of prostate N/A 10/02/2014    Procedure: GREEN LIGHT LASER photo evaporization of prostate;  Surgeon: Festus Aloe, MD;  Location: [facility];  Service: Urology;  Laterality: N/A;    FAMILY HISTORY: Family History  Problem Relation Age of Onset  . CVA Mother   . Pneumonia Father     SOCIAL HISTORY:  History   Social History  . Marital Status: Unknown    Spouse Name: N/A    Number of Children: 0  . Years of Education: 12   Occupational History    Retired   Social History Main Topics  . Smoking status: Former Smoker --  5 years    Types: Pipe  . Smokeless tobacco: Former Systems developer    Types: Chew     Comment: 08/23/2012 "smoked a little pipe in my 20's; haven't chewed in ~ 60month  . Alcohol Use: No     Comment: 08/23/2012 "last alcohol was a beer in 1980; never had problems w/it"  . Drug Use: No  . Sexual Activity: No   Other Topics Concern  . Not on file   Social History Narrative   Patient lives at [facility]   Retired.   Education high school.   Right handed.   Caffeine two cups of tea daily.     PHYSICAL EXAM   Filed Vitals:   11/05/14 0957  BP: 128/72  Pulse: 68    Not recorded      Cannot calculate BMI with a height equal to zero.  PHYSICAL EXAMNIATION:  Gen: NAD, conversant, well nourised, obese, well groomed                     Cardiovascular: Regular rate rhythm, no peripheral edema, warm, nontender. Eyes: Conjunctivae clear without exudates or  hemorrhage Neck: Supple, no carotid bruise. Pulmonary: Clear to auscultation bilaterally   NEUROLOGICAL EXAM:  MENTAL STATUS: Speech:    Speech is normal; fluent and spontaneous with normal comprehension.  Cognition:    The patient is oriented to person, place, and time;     recent and remote memory intact;     language fluent;     normal attention, concentration,     fund of knowledge.  CRANIAL NERVES: CN II: Visual fields are full to confrontation. Fundoscopic exam is normal with sharp discs and no vascular changes. Venous pulsations are present bilaterally. Pupils are 4 mm and briskly reactive to light. Visual acuity is 20/20 bilaterally. CN III, IV, VI: extraocular movement are normal. No ptosis. CN V: Facial sensation is intact to pinprick in all 3 divisions bilaterally. Corneal responses are intact.  CN VII: Face is symmetric with normal eye closure and smile. CN VIII: Hearing is normal to rubbing fingers CN IX, X: Palate elevates symmetrically. Phonation is normal. CN XI: Head turning and shoulder shrug are intact CN XII: Tongue is midline with normal movements and no atrophy.  MOTOR:  He sees in wheelchair, spastic left hemiparesis,  Left arm proximal and distal  against gravity, no significant movement of left lower extreity   REFLEXES: Reflexes are 2+ and symmetric at the biceps, triceps, knees, and ankles. Plantar responses are flexor.  SENSORY: Light touch, pinprick, position sense, and vibration sense are intact in fingers and toes.  COORDINATION: Rapid alternating movements and fine finger movements are intact. There is no dysmetria on finger-to-nose and heel-knee-shin at right sideThere are no abnormal or extraneous movements.   GAIT/STANCE:  deferred  DIAGNOSTIC DATA (LABS, IMAGING, TESTING) - I reviewed patient records, labs, notes, testing and imaging myself where available.  Lab Results  Component Value Date   WBC 10.7* 10/02/2014   HGB 11.7*  10/02/2014   HCT 36.0* 10/02/2014   MCV 107.5* 10/02/2014   PLT 390 10/02/2014      Component Value Date/Time   NA 137 10/02/2014 0842   K 3.8 10/02/2014 0842   CL 103 10/02/2014 0842   CO2 27 10/02/2014 0842   GLUCOSE 102* 10/02/2014 0842   BUN 26* 10/02/2014 0842   CREATININE 0.85 10/02/2014 0842   CALCIUM 9.0 10/02/2014 0842   PROT 6.0 08/06/2014 1025   PROT  5.4* 06/03/2014 0810   ALBUMIN 2.4* 06/03/2014 0810   AST 17 06/03/2014 0810   ALT 12 06/03/2014 0810   ALKPHOS 73 06/03/2014 0810   BILITOT 0.5 06/03/2014 0810   GFRNONAA 76* 10/02/2014 0842   GFRAA 88* 10/02/2014 0842   Lab Results  Component Value Date   CHOL 129 08/23/2012   HDL 44 08/23/2012   LDLCALC 74 08/23/2012   TRIG 55 08/23/2012   CHOLHDL 2.9 08/23/2012   Lab Results  Component Value Date   HGBA1C 5.0 08/23/2012   Lab Results  Component Value Date   VITAMINB12 940 08/06/2014   Lab Results  Component Value Date   TSH 3.790 08/06/2014   ASSESSMENT AND PLAN  Ladanian Kelter is a 81 y.o. male with left side weakness, involving his left arm and leg since 1980s, abnormal MRI cervical, brain findings, he denies a clear history of relapsing remitting, the imaging findings and long-standing history consistent with primary progressive multiple sclerosis, also superimposed bilateral lumbosacral radiculopathy, left worse than right.   he is currently nursing home resident, wheelchair bound, nonambulatory, we will not  Pursue immunomodulation therapy,   Only return to clinic for new issue   Marcial Pacas, M.D. Ph.D.  [facility] [address], [place], [place] [address] [phone]

## 2014-11-13 ENCOUNTER — Ambulatory Visit: Payer: Medicare Other | Admitting: Cardiology

## 2015-04-23 ENCOUNTER — Encounter (HOSPITAL_COMMUNITY): Payer: Self-pay | Admitting: *Deleted

## 2015-04-23 ENCOUNTER — Emergency Department (HOSPITAL_COMMUNITY): Payer: Medicare Other

## 2015-04-23 ENCOUNTER — Inpatient Hospital Stay (HOSPITAL_COMMUNITY)
Admission: EM | Admit: 2015-04-23 | Discharge: 2015-04-25 | DRG: 871 | Disposition: A | Discharge status: Skilled Nursing Facility | Payer: Medicare Other | Attending: Internal Medicine | Admitting: Internal Medicine

## 2015-04-23 DIAGNOSIS — Z7901 Long term (current) use of anticoagulants: Secondary | ICD-10-CM

## 2015-04-23 DIAGNOSIS — Z66 Do not resuscitate: Secondary | ICD-10-CM | POA: Diagnosis present

## 2015-04-23 DIAGNOSIS — N179 Acute kidney failure, unspecified: Secondary | ICD-10-CM | POA: Diagnosis present

## 2015-04-23 DIAGNOSIS — Z87891 Personal history of nicotine dependence: Secondary | ICD-10-CM | POA: Diagnosis not present

## 2015-04-23 DIAGNOSIS — Z515 Encounter for palliative care: Secondary | ICD-10-CM | POA: Diagnosis not present

## 2015-04-23 DIAGNOSIS — I482 Chronic atrial fibrillation, unspecified: Secondary | ICD-10-CM | POA: Diagnosis present

## 2015-04-23 DIAGNOSIS — I509 Heart failure, unspecified: Secondary | ICD-10-CM | POA: Diagnosis not present

## 2015-04-23 DIAGNOSIS — I69354 Hemiplegia and hemiparesis following cerebral infarction affecting left non-dominant side: Secondary | ICD-10-CM | POA: Diagnosis not present

## 2015-04-23 DIAGNOSIS — E8770 Fluid overload, unspecified: Secondary | ICD-10-CM

## 2015-04-23 DIAGNOSIS — J690 Pneumonitis due to inhalation of food and vomit: Secondary | ICD-10-CM | POA: Diagnosis present

## 2015-04-23 DIAGNOSIS — J189 Pneumonia, unspecified organism: Secondary | ICD-10-CM

## 2015-04-23 DIAGNOSIS — G35 Multiple sclerosis: Secondary | ICD-10-CM | POA: Diagnosis present

## 2015-04-23 DIAGNOSIS — M5030 Other cervical disc degeneration, unspecified cervical region: Secondary | ICD-10-CM | POA: Diagnosis present

## 2015-04-23 DIAGNOSIS — Z79899 Other long term (current) drug therapy: Secondary | ICD-10-CM

## 2015-04-23 DIAGNOSIS — Z792 Long term (current) use of antibiotics: Secondary | ICD-10-CM | POA: Diagnosis not present

## 2015-04-23 DIAGNOSIS — D649 Anemia, unspecified: Secondary | ICD-10-CM | POA: Diagnosis present

## 2015-04-23 DIAGNOSIS — R0602 Shortness of breath: Secondary | ICD-10-CM

## 2015-04-23 DIAGNOSIS — I5031 Acute diastolic (congestive) heart failure: Secondary | ICD-10-CM | POA: Diagnosis present

## 2015-04-23 DIAGNOSIS — R601 Generalized edema: Secondary | ICD-10-CM | POA: Diagnosis present

## 2015-04-23 DIAGNOSIS — Z7401 Bed confinement status: Secondary | ICD-10-CM

## 2015-04-23 DIAGNOSIS — N400 Enlarged prostate without lower urinary tract symptoms: Secondary | ICD-10-CM | POA: Diagnosis present

## 2015-04-23 DIAGNOSIS — N3000 Acute cystitis without hematuria: Secondary | ICD-10-CM | POA: Diagnosis not present

## 2015-04-23 DIAGNOSIS — A419 Sepsis, unspecified organism: Principal | ICD-10-CM | POA: Diagnosis present

## 2015-04-23 DIAGNOSIS — G9340 Encephalopathy, unspecified: Secondary | ICD-10-CM | POA: Diagnosis present

## 2015-04-23 DIAGNOSIS — N390 Urinary tract infection, site not specified: Secondary | ICD-10-CM | POA: Diagnosis present

## 2015-04-23 DIAGNOSIS — E8809 Other disorders of plasma-protein metabolism, not elsewhere classified: Secondary | ICD-10-CM | POA: Diagnosis present

## 2015-04-23 DIAGNOSIS — D6959 Other secondary thrombocytopenia: Secondary | ICD-10-CM | POA: Diagnosis present

## 2015-04-23 DIAGNOSIS — T68XXXA Hypothermia, initial encounter: Secondary | ICD-10-CM | POA: Diagnosis present

## 2015-04-23 HISTORY — DX: Difficulty in walking, not elsewhere classified: R26.2

## 2015-04-23 HISTORY — DX: Other cervical disc degeneration, unspecified cervical region: M50.30

## 2015-04-23 HISTORY — DX: Spinal stenosis, lumbar region without neurogenic claudication: M48.061

## 2015-04-23 HISTORY — DX: Weakness: R53.1

## 2015-04-23 HISTORY — DX: Multiple sclerosis: G35

## 2015-04-23 HISTORY — DX: Benign prostatic hyperplasia without lower urinary tract symptoms: N40.0

## 2015-04-23 HISTORY — DX: Do not resuscitate: Z66

## 2015-04-23 LAB — URINALYSIS, ROUTINE W REFLEX MICROSCOPIC
Bilirubin Urine: NEGATIVE
GLUCOSE, UA: NEGATIVE mg/dL
KETONES UR: NEGATIVE mg/dL
NITRITE: POSITIVE — AB
PH: 5 (ref 5.0–8.0)
PROTEIN: NEGATIVE mg/dL
Specific Gravity, Urine: 1.015 (ref 1.005–1.030)
Urobilinogen, UA: 0.2 mg/dL (ref 0.0–1.0)

## 2015-04-23 LAB — CBC
HCT: 35.8 % — ABNORMAL LOW (ref 39.0–52.0)
Hemoglobin: 11.7 g/dL — ABNORMAL LOW (ref 13.0–17.0)
MCH: 31.5 pg (ref 26.0–34.0)
MCHC: 32.7 g/dL (ref 30.0–36.0)
MCV: 96.5 fL (ref 78.0–100.0)
PLATELETS: 110 10*3/uL — AB (ref 150–400)
RBC: 3.71 MIL/uL — ABNORMAL LOW (ref 4.22–5.81)
RDW: 16 % — AB (ref 11.5–15.5)
WBC: 8.9 10*3/uL (ref 4.0–10.5)

## 2015-04-23 LAB — BASIC METABOLIC PANEL
Anion gap: 10 (ref 5–15)
BUN: 57 mg/dL — AB (ref 6–20)
CO2: 21 mmol/L — ABNORMAL LOW (ref 22–32)
CREATININE: 1.18 mg/dL (ref 0.61–1.24)
Calcium: 9.3 mg/dL (ref 8.9–10.3)
Chloride: 110 mmol/L (ref 101–111)
GFR calc Af Amer: >60 mL/min (ref >60)
GFR, EST NON AFRICAN AMERICAN: 53 mL/min — AB (ref >60)
Glucose, Bld: 79 mg/dL (ref 65–99)
Potassium: 5 mmol/L (ref 3.5–5.1)
SODIUM: 141 mmol/L (ref 135–145)

## 2015-04-23 LAB — PROTIME-INR
INR: 3.78 — AB (ref 0.00–1.49)
Prothrombin Time: 36.4 seconds — ABNORMAL HIGH (ref 11.6–15.2)

## 2015-04-23 LAB — I-STAT TROPONIN, ED: Troponin i, poc: 0.01 ng/mL (ref 0.00–0.08)

## 2015-04-23 LAB — BRAIN NATRIURETIC PEPTIDE: B NATRIURETIC PEPTIDE 5: 307.1 pg/mL — AB (ref 0.0–100.0)

## 2015-04-23 LAB — URINE MICROSCOPIC-ADD ON

## 2015-04-23 LAB — MRSA PCR SCREENING: MRSA BY PCR: NEGATIVE

## 2015-04-23 LAB — LACTIC ACID, PLASMA
Lactic Acid, Venous: 0.8 mmol/L (ref 0.5–2.0)
Lactic Acid, Venous: 0.9 mmol/L (ref 0.5–2.0)

## 2015-04-23 LAB — I-STAT CG4 LACTIC ACID, ED: Lactic Acid, Venous: 0.68 mmol/L (ref 0.5–2.0)

## 2015-04-23 MED ORDER — IPRATROPIUM BROMIDE 0.02 % IN SOLN
0.5000 mg | Freq: Four times a day (QID) | RESPIRATORY_TRACT | Status: DC
  Administered 2015-04-23 – 2015-04-24 (×5): 0.5 mg via RESPIRATORY_TRACT
  Filled 2015-04-23 (×4): qty 2.5

## 2015-04-23 MED ORDER — CETYLPYRIDINIUM CHLORIDE 0.05 % MT LIQD
7.0000 mL | Freq: Two times a day (BID) | OROMUCOSAL | Status: DC
  Administered 2015-04-24 – 2015-04-25 (×4): 7 mL via OROMUCOSAL

## 2015-04-23 MED ORDER — LEVALBUTEROL HCL 1.25 MG/0.5ML IN NEBU
1.2500 mg | INHALATION_SOLUTION | Freq: Four times a day (QID) | RESPIRATORY_TRACT | Status: DC
  Administered 2015-04-23 – 2015-04-24 (×5): 1.25 mg via RESPIRATORY_TRACT
  Filled 2015-04-23 (×8): qty 0.5

## 2015-04-23 MED ORDER — LEVALBUTEROL HCL 1.25 MG/0.5ML IN NEBU: 1.2500 mg | INHALATION_SOLUTION | Freq: Four times a day (QID) | RESPIRATORY_TRACT | Status: DC

## 2015-04-23 MED ORDER — FUROSEMIDE 10 MG/ML IJ SOLN
20.0000 mg | Freq: Two times a day (BID) | INTRAMUSCULAR | Status: DC
  Administered 2015-04-23 – 2015-04-24 (×3): 20 mg via INTRAVENOUS
  Filled 2015-04-23 (×3): qty 2

## 2015-04-23 MED ORDER — PIPERACILLIN-TAZOBACTAM 3.375 G IVPB
3.3750 g | Freq: Three times a day (TID) | INTRAVENOUS | Status: DC
  Administered 2015-04-24 – 2015-04-25 (×5): 3.375 g via INTRAVENOUS
  Filled 2015-04-23 (×6): qty 50

## 2015-04-23 MED ORDER — PIPERACILLIN-TAZOBACTAM 3.375 G IVPB 30 MIN
3.3750 g | Freq: Once | INTRAVENOUS | Status: AC
  Administered 2015-04-23: 3.375 g via INTRAVENOUS

## 2015-04-23 MED ORDER — LEVALBUTEROL HCL 1.25 MG/0.5ML IN NEBU
INHALATION_SOLUTION | RESPIRATORY_TRACT | Status: AC
Start: 2015-04-23 — End: 2015-04-23
  Administered 2015-04-23: 1.25 mg via RESPIRATORY_TRACT
  Filled 2015-04-23: qty 0.5

## 2015-04-23 MED ORDER — PIPERACILLIN-TAZOBACTAM 3.375 G IVPB 30 MIN
3.3750 g | Freq: Three times a day (TID) | INTRAVENOUS | Status: DC
  Filled 2015-04-23: qty 50

## 2015-04-23 MED ORDER — SODIUM CHLORIDE 0.9 % IV SOLN
INTRAVENOUS | Status: DC
  Administered 2015-04-23: 18:00:00 via INTRAVENOUS

## 2015-04-23 MED ORDER — IPRATROPIUM BROMIDE 0.02 % IN SOLN
RESPIRATORY_TRACT | Status: AC
  Administered 2015-04-23: 0.5 mg via RESPIRATORY_TRACT
  Filled 2015-04-23: qty 2.5

## 2015-04-23 MED ORDER — VANCOMYCIN HCL IN DEXTROSE 1-5 GM/200ML-% IV SOLN
1000.0000 mg | Freq: Once | INTRAVENOUS | Status: AC
  Administered 2015-04-23: 1000 mg via INTRAVENOUS
  Filled 2015-04-23: qty 200

## 2015-04-23 MED ORDER — VANCOMYCIN HCL IN DEXTROSE 750-5 MG/150ML-% IV SOLN
750.0000 mg | Freq: Two times a day (BID) | INTRAVENOUS | Status: DC
  Administered 2015-04-24 – 2015-04-25 (×3): 750 mg via INTRAVENOUS
  Filled 2015-04-23 (×4): qty 150

## 2015-04-23 NOTE — Progress Notes (Addendum)
ANTIBIOTIC CONSULT NOTE - INITIAL  Pharmacy Consult for vanc Indication: pneumonia  No Known Allergies  Patient Measurements:    Vital Signs: Temp: 91.3 F (32.9 C) (08/23 1721) Temp Source: Rectal (08/23 1721) BP: 111/66 mmHg (08/23 1800) Pulse Rate: 54 (08/23 1800) Intake/Output from previous day:   Intake/Output from this shift:    Labs:  Recent Labs  04/23/15 1714  WBC 8.9  HGB 11.7*  PLT 110*  CREATININE 1.18   CrCl cannot be calculated (Unknown ideal weight.). No results for input(s): VANCOTROUGH, VANCOPEAK, VANCORANDOM, GENTTROUGH, GENTPEAK, GENTRANDOM, TOBRATROUGH, TOBRAPEAK, TOBRARND, AMIKACINPEAK, AMIKACINTROU, AMIKACIN in the last 72 hours.   Microbiology: No results found for this or any previous visit (from the past 720 hour(s)).  Medical History: Past Medical History  Diagnosis Date  . Stroke 08/22/2012    residual left sided weakness (08/23/2012)  . TIA (transient ischemic attack)   . Chronic atrial fibrillation     Not on coumadin as he was deemed a fall risk after he fell in his cardiologists office.  Marland Kitchen Dysrhythmia     Atrial Fib  . Carpal tunnel syndrome     diagnosed in 1997  . Chronic kidney disease     Urinary retention  . DNR (do not resuscitate)   . BPH (benign prostatic hypertrophy)   . Left-sided weakness     "since the 1980's"  . DDD (degenerative disc disease), cervical   . Spinal stenosis of lumbar region   . MS (multiple sclerosis)   . Unable to ambulate     Medications:  Scheduled:   Assessment: 81 yo male presents with edema of his upper extremities and abdomen. Pt has hx of HF.   WBC wnl, SCr 1.18  Goal of Therapy:  Vancomycin trough level 15-20 mcg/ml  Plan:  Vanc LD 1000 mg x1 Monitor clinical progression, LOT, cultures, VT at Css  Greggory Stallion, PharmD Clinical Pharmacy Resident Pager # [phone] 04/23/2015 6:53 PM  ADDN: Pt is hypothermic at 91.3, WBC 8.9, sCr 1.18.  Plan: Vancomycin  IV  q12h Continue Zosyn 3.375g IV q8h  Arlean Hopping. Newman Pies, PharmD Clinical Pharmacist Pager [phone]

## 2015-04-23 NOTE — ED Notes (Signed)
Pt arrives via EMS from [facility]. Staff states that the edema to his upper extremities and abd is not normal for pt. Staff reports that edema to LE is normal. Pt has hx of CHF and takes lasix. Staff was unsure about urine output. Pt reports that he feels like he needs to cough but cannot.

## 2015-04-23 NOTE — ED Provider Notes (Signed)
 CSN: [id number]     Arrival date & time 04/23/15  1702 History   First MD Initiated Contact with Patient 04/23/15 1716     Chief Complaint  Patient presents with  . Congestive Heart Failure      Patient is a 81 y.o. male presenting with CHF. The history is provided by the EMS personnel, the nursing home and the patient. The history is limited by the condition of the patient (AMS).  Congestive Heart Failure  Pt was seen at 1720. Per EMS, NH report and pt: NH states pt has had increasing "swelling" of his bilat LE's, abd wall, and UE's for unknown period of time. NH states pt appears "more tired than usual" today.  Pt states he "feels like he needs to cough." NH unclear regarding urine output. NH states pt's baseline is non-ambulatory, laying in bed with eyes closed and he is DNR.   Past Medical History  Diagnosis Date  . Stroke 08/22/2012    residual left sided weakness (08/23/2012)  . TIA (transient ischemic attack)   . Chronic atrial fibrillation     Not on coumadin as he was deemed a fall risk after he fell in his cardiologists office.  Marland Kitchen Dysrhythmia     Atrial Fib  . Carpal tunnel syndrome     diagnosed in 1997  . Chronic kidney disease     Urinary retention  . DNR (do not resuscitate)   . BPH (benign prostatic hypertrophy)   . Left-sided weakness     "since the 1980's"  . DDD (degenerative disc disease), cervical   . Spinal stenosis of lumbar region   . MS (multiple sclerosis)   . Unable to ambulate    Past Surgical History  Procedure Laterality Date  . Inguinal hernia repair  ~ 1992    "left" (08/23/2012)  . Cataract extraction w/ intraocular lens  implant, bilateral  ~ 2005  . Proctosigmoidoscopy      November 2001 normal screening proctocolonoscopy to the cecum   . Intramedullary (im) nail intertrochanteric Left 05/21/2014    Procedure: LEFT INTRAMEDULLARY (IM) NAIL INTERTROCHANTRIC;  Surgeon: Cammy Copa, MD;  Location: [facility];  Service: Orthopedics;   Laterality: Left;  Marland Kitchen Green light laser turp (transurethral resection of prostate N/A 10/02/2014    Procedure: GREEN LIGHT LASER photo evaporization of prostate;  Surgeon: Jerilee Field, MD;  Location: [facility];  Service: Urology;  Laterality: N/A;   Family History  Problem Relation Age of Onset  . CVA Mother   . Pneumonia Father    Social History  Substance Use Topics  . Smoking status: Former Smoker -- 5 years    Types: Pipe  . Smokeless tobacco: Former Neurosurgeon    Types: Chew     Comment: 08/23/2012 "smoked a little pipe in my 20's; haven't chewed in ~ 49month"  . Alcohol Use: No     Comment: 08/23/2012 "last alcohol was a beer in 1980; never had problems w/it"    Review of Systems  Unable to perform ROS: Mental status change      Allergies  Review of patient's allergies indicates no known allergies.  Home Medications   Prior to Admission medications   Medication Sig Start Date End Date Taking? Authorizing Provider  Amino Acids-Protein Hydrolys (FEEDING SUPPLEMENT, PRO-STAT SUGAR FREE 64,) LIQD Take 30 mLs by mouth 2 (two) times daily.    Historical Provider, MD  baclofen (LIORESAL) 10 MG tablet One to 2 tabs po qhs 11/05/14   Yijun  Terrace Arabia, MD  bisacodyl (DULCOLAX) 5 MG EC tablet Take 5 mg by mouth daily as needed for moderate constipation.    Historical Provider, MD  docusate sodium (COLACE) 100 MG capsule Take 100 mg by mouth 2 (two) times daily.    Historical Provider, MD  docusate sodium 100 MG CAPS Take 100 mg by mouth 2 (two) times daily. 05/24/14   Ripudeep Jenna Luo, MD  enoxaparin (LOVENOX) 60 MG/0.6ML injection Inject 0.6 mLs (60 mg total) into the skin daily. 10/03/14   Jerilee Field, MD  finasteride (PROSCAR) 5 MG tablet Take 1 tablet (5 mg total) by mouth daily. 06/04/14   Rhetta Mura, MD  furosemide (LASIX) 40 MG tablet Take 40 mg by mouth daily.    Historical Provider, MD  HYDROcodone-acetaminophen (NORCO/VICODIN) 5-325 MG per tablet Take 1-2 tablets by mouth every  4 (four) hours as needed for moderate pain. Patient taking differently: Take 1 tablet by mouth every 4 (four) hours as needed for moderate pain.  05/22/14   Cammy Copa, MD  iron polysaccharides (NIFEREX) 150 MG capsule Take 150 mg by mouth daily.    Historical Provider, MD  losartan (COZAAR) 50 MG tablet Take 50 mg by mouth every morning.     Historical Provider, MD  metoprolol tartrate (LOPRESSOR) 25 MG tablet Take 0.5 tablets (12.5 mg total) by mouth 2 (two) times daily. Patient taking differently: Take 12.5 mg by mouth every morning.  05/17/14   Quintella Reichert, MD  polyethylene glycol (MIRALAX / GLYCOLAX) packet Take 17 g by mouth daily. 05/24/14   Ripudeep Jenna Luo, MD  senna (SENOKOT) 8.6 MG TABS tablet Take 1 tablet by mouth daily.    Historical Provider, MD  tamsulosin (FLOMAX) 0.4 MG CAPS capsule Take 1 capsule (0.4 mg total) by mouth daily after breakfast. 06/04/14   Rhetta Mura, MD  warfarin (COUMADIN) 1 MG tablet Take 1 tablet (1 mg total) by mouth daily. 10/04/14   Jerilee Field, MD  warfarin (COUMADIN) 4 MG tablet Take 1 tablet (4 mg total) by mouth as directed. 10/04/14   Jerilee Field, MD   BP 108/75 mmHg  Pulse 63  Temp(Src) 91.3 F (32.9 C) (Rectal)  Resp 25  SpO2 94% Physical Exam  1725: Physical examination:  Nursing notes reviewed; Vital signs and O2 SAT reviewed;  Constitutional: Well developed, Well nourished, In no acute distress; Head:  Normocephalic, atraumatic; Eyes: EOMI, PERRL, No scleral icterus; ENMT: Mouth and pharynx normal, Mucous membranes dry; Neck: Supple, Full range of motion, No lymphadenopathy; Cardiovascular: Irregular rate and rhythm, No gallop; Respiratory: Breath sounds coarse & equal bilaterally, No wheezes.  Speaking full sentences with ease, Normal respiratory effort/excursion; Chest: Nontender, Movement normal; Abdomen: Soft, Nontender, Nondistended, Normal bowel sounds; Genitourinary: No CVA tenderness; Extremities: Pulses normal, No  deformity. +2 bilat LE's edema, lower abd wall edema, scrotal edema; +1 bilat UE's edema. No calf asymmetry.; Neuro: Laying eyes closed, intermittently coughs, moans and answers ED RN questions (NH states this is pt's baseline). +left sided weakness per hx..; Skin: Color normal, Warm, Dry.   ED Course  Procedures (including critical care time)  Labs Review  Imaging Review  I have personally reviewed and evaluated these images and lab results as part of my medical decision-making.   EKG Interpretation   Date/Time:  Tuesday April 23 2015 17:10:31 EDT Ventricular Rate:  61 PR Interval:    QRS Duration: 113 QT Interval:  441 QTC Calculation: 444 R Axis:   6 Text Interpretation:  Atrial  fibrillation Borderline intraventricular  conduction delay Low voltage, extremity and precordial leads When compared  with ECG of 05/21/2014 Rate slower Confirmed by [facility]  MD, Nicholos Johns  [phone]) on 04/23/2015 5:18:41 PM      MDM  MDM Reviewed: previous chart, nursing note and vitals Reviewed previous: labs and ECG Interpretation: labs, ECG and x-ray      Results for orders placed or performed during the hospital encounter of 04/23/15  Basic metabolic panel  Result Value Ref Range   Sodium 141 135 - 145 mmol/L   Potassium 5.0 3.5 - 5.1 mmol/L   Chloride 110 101 - 111 mmol/L   CO2 21 (L) 22 - 32 mmol/L   Glucose, Bld 79 65 - 99 mg/dL   BUN 57 (H) 6 - 20 mg/dL   Creatinine, Ser 1.91 0.61 - 1.24 mg/dL   Calcium 9.3 8.9 - 47.8 mg/dL   GFR calc non Af Amer 53 (L) >60 mL/min   GFR calc Af Amer >60 >60 mL/min   Anion gap 10 5 - 15  CBC  Result Value Ref Range   WBC 8.9 4.0 - 10.5 K/uL   RBC 3.71 (L) 4.22 - 5.81 MIL/uL   Hemoglobin 11.7 (L) 13.0 - 17.0 g/dL   HCT 29.5 (L) 62.1 - 30.8 %   MCV 96.5 78.0 - 100.0 fL   MCH 31.5 26.0 - 34.0 pg   MCHC 32.7 30.0 - 36.0 g/dL   RDW 65.7 (H) 84.6 - 96.2 %   Platelets 110 (L) 150 - 400 K/uL  Brain natriuretic peptide  Result Value Ref Range   B  Natriuretic Peptide 307.1 (H) 0.0 - 100.0 pg/mL  Urinalysis, Routine w reflex microscopic (not at [facility])  Result Value Ref Range   Color, Urine YELLOW YELLOW   APPearance CLEAR CLEAR   Specific Gravity, Urine 1.015 1.005 - 1.030   pH 5.0 5.0 - 8.0   Glucose, UA NEGATIVE NEGATIVE mg/dL   Hgb urine dipstick MODERATE (A) NEGATIVE   Bilirubin Urine NEGATIVE NEGATIVE   Ketones, ur NEGATIVE NEGATIVE mg/dL   Protein, ur NEGATIVE NEGATIVE mg/dL   Urobilinogen, UA 0.2 0.0 - 1.0 mg/dL   Nitrite POSITIVE (A) NEGATIVE   Leukocytes, UA LARGE (A) NEGATIVE  Protime-INR  Result Value Ref Range   Prothrombin Time 36.4 (H) 11.6 - 15.2 seconds   INR 3.78 (H) 0.00 - 1.49  Urine microscopic-add on  Result Value Ref Range   Squamous Epithelial / LPF RARE RARE   WBC, UA TOO NUMEROUS TO COUNT <3 WBC/hpf   RBC / HPF 11-20 <3 RBC/hpf   Bacteria, UA MANY (A) RARE   Urine-Other AMORPHOUS URATES/PHOSPHATES   I-Stat CG4 Lactic Acid, ED  Result Value Ref Range   Lactic Acid, Venous 0.68 0.5 - 2.0 mmol/L  I-stat troponin, ED  Result Value Ref Range   Troponin i, poc 0.01 0.00 - 0.08 ng/mL   Comment 3           Dg Chest Portable 1 View 04/23/2015   CLINICAL DATA:  Swelling, history TIA, atrial fibrillation, chronic kidney disease, multiple sclerosis, former smoker  EXAM: PORTABLE CHEST - 1 VIEW  COMPARISON:  Portable exam 1740 hours compared to 05/30/2014  FINDINGS: Enlargement of cardiac silhouette.  Rotated to the RIGHT, which may account for RIGHT hilar prominence.  Diffuse BILATERAL pulmonary infiltrates question edema versus infection.  No gross pleural effusion or pneumothorax.  Bones demineralized.  IMPRESSION: Enlargement of cardiac silhouette with pulmonary vascular congestion.  Diffuse BILATERAL  pulmonary infiltrates question edema versus infection.   Electronically Signed   By: Ulyses Southward M.D.   On: 04/23/2015 17:44    1840:  Bearhugger in place for hypothermia. Pt appears fluid overloaded on exam,  but BP labile; will hold lasix for now.  Pt will cough with Sats increasing to 97% R/A, lungs coarse. Will start IV abx to cover HCAP and UTI (UC is pending). Pharm Tech reviewed pt's meds: pt received norco this morning; likely contributing increased lethargy.  T/C to [facility] Dr. Robb Matar, case discussed, including:  HPI, pertinent PM/SHx, VS/PE, dx testing, ED course and treatment:  Agreeable to admit, requests he will come to the ED for evaluation.     Samuel Jester, DO  [id number]

## 2015-04-23 NOTE — Progress Notes (Signed)
Patient unable to answer health history questions.No family at bedside.Will report off to next shift to try to complete.

## 2015-04-23 NOTE — Progress Notes (Signed)
Pt was NTS per verbal MD orders. Pt has coarse crackles at the door . Minimal improvement with suctioning. Pulled out tannish pink frothy secretions moderate amount with 2 passes. SpO2 ranges were acceptable throughout suctioning. No complications noted. O2 saturations now presents at 97%.

## 2015-04-23 NOTE — ED Notes (Signed)
Sister: Rae Lips [phone]

## 2015-04-23 NOTE — H&P (Signed)
[facility] History and Physical  Shannon Kirkendall ZOX:[id number] DOB: 1924-04-12 DOA: 04/23/2015  Referring physician: Samuel Jester, D.O. PCP: Garlan Fillers, MD   Chief Complaint: Sent from the nursing home due to swelling.  HPI: Erik Arnold is a 81 y.o. male with a past medical history of CVA, chronic atrial fibrillation, chronic kidney disease, multiple sclerosis who is bedbound and is being referred from the nursing home to this facility emergency Department due to edema and increase in tiredness. No further information at this time is available and the nursing home is unclear about the patient's input and output.  Workup in the ER is significant for an abnormal UA, elevated BUN, mild anemia and elevated BNP. Currently in no acute distress.   Review of Systems:  Unable to perform due to the patient's clinical status.  Past Medical History  Diagnosis Date  . Stroke 08/22/2012    residual left sided weakness (08/23/2012)  . TIA (transient ischemic attack)   . Chronic atrial fibrillation     Not on coumadin as he was deemed a fall risk after he fell in his cardiologists office.  Marland Kitchen Dysrhythmia     Atrial Fib  . Carpal tunnel syndrome     diagnosed in 1997  . Chronic kidney disease     Urinary retention  . DNR (do not resuscitate)   . BPH (benign prostatic hypertrophy)   . Left-sided weakness     "since the 1980's"  . DDD (degenerative disc disease), cervical   . Spinal stenosis of lumbar region   . MS (multiple sclerosis)   . Unable to ambulate    Past Surgical History  Procedure Laterality Date  . Inguinal hernia repair  ~ 1992    "left" (08/23/2012)  . Cataract extraction w/ intraocular lens  implant, bilateral  ~ 2005  . Proctosigmoidoscopy      November 2001 normal screening proctocolonoscopy to the cecum   . Intramedullary (im) nail intertrochanteric Left 05/21/2014    Procedure: LEFT INTRAMEDULLARY (IM) NAIL INTERTROCHANTRIC;  Surgeon: Cammy Copa,  MD;  Location: [facility] OR;  Service: Orthopedics;  Laterality: Left;  Marland Kitchen Green light laser turp (transurethral resection of prostate N/A 10/02/2014    Procedure: GREEN LIGHT LASER photo evaporization of prostate;  Surgeon: Jerilee Field, MD;  Location: [facility] ORS;  Service: Urology;  Laterality: N/A;   Social History:  reports that he has quit smoking. His smoking use included Pipe. He has quit using smokeless tobacco. His smokeless tobacco use included Chew. He reports that he does not drink alcohol or use illicit drugs.  No Known Allergies  Family History  Problem Relation Age of Onset  . CVA Mother   . Pneumonia Father     Prior to Admission medications   Medication Sig Start Date End Date Taking? Authorizing Provider  Amino Acids-Protein Hydrolys (FEEDING SUPPLEMENT, PRO-STAT SUGAR FREE 64,) LIQD Take 30 mLs by mouth 2 (two) times daily.   Yes Historical Provider, MD  baclofen (LIORESAL) 20 MG tablet Take 20 mg by mouth at bedtime.   Yes Historical Provider, MD  bisacodyl (DULCOLAX) 5 MG EC tablet Take 5 mg by mouth daily as needed for moderate constipation.   Yes Historical Provider, MD  docusate sodium 100 MG CAPS Take 100 mg by mouth 2 (two) times daily. 05/24/14  Yes Ripudeep Jenna Luo, MD  finasteride (PROSCAR) 5 MG tablet Take 1 tablet (5 mg total) by mouth daily. 06/04/14  Yes Rhetta Mura, MD  furosemide (LASIX) 40 MG  tablet Take 40 mg by mouth daily.   Yes Historical Provider, MD  HYDROcodone-acetaminophen (NORCO/VICODIN) 5-325 MG per tablet Take 1-2 tablets by mouth every 4 (four) hours as needed for moderate pain. Patient taking differently: Take 1 tablet by mouth every 4 (four) hours as needed for moderate pain.  05/22/14  Yes Scott Rise Paganini, MD  iron polysaccharides (NIFEREX) 150 MG capsule Take 150 mg by mouth daily.   Yes Historical Provider, MD  losartan (COZAAR) 50 MG tablet Take 50 mg by mouth every morning.    Yes Historical Provider, MD  metoprolol tartrate (LOPRESSOR) 25  MG tablet Take 0.5 tablets (12.5 mg total) by mouth 2 (two) times daily. Patient taking differently: Take 12.5 mg by mouth every morning.  05/17/14  Yes Quintella Reichert, MD  polyethylene glycol (MIRALAX / GLYCOLAX) packet Take 17 g by mouth daily. 05/24/14  Yes Ripudeep Jenna Luo, MD  rOPINIRole (REQUIP) 0.5 MG tablet Take 0.5 mg by mouth every evening.   Yes Historical Provider, MD  senna (SENOKOT) 8.6 MG TABS tablet Take 1 tablet by mouth daily.   Yes Historical Provider, MD  tamsulosin (FLOMAX) 0.4 MG CAPS capsule Take 1 capsule (0.4 mg total) by mouth daily after breakfast. 06/04/14  Yes Rhetta Mura, MD  warfarin (COUMADIN) 1 MG tablet Take 1 tablet (1 mg total) by mouth daily. Patient taking differently: Take 1 mg by mouth See admin instructions.  10/04/14  Yes Jerilee Field, MD  warfarin (COUMADIN) 1 MG tablet Take 0.5 mg by mouth See admin instructions. Take 0.5mg  tablet by mouth on Wednesday and Saturday   Yes Historical Provider, MD  baclofen (LIORESAL) 10 MG tablet One to 2 tabs po qhs Patient not taking: Reported on 04/23/2015 11/05/14   Levert Feinstein, MD  enoxaparin (LOVENOX) 60 MG/0.6ML injection Inject 0.6 mLs (60 mg total) into the skin daily. Patient not taking: Reported on 04/23/2015 10/03/14   Jerilee Field, MD  warfarin (COUMADIN) 4 MG tablet Take 1 tablet (4 mg total) by mouth as directed. Patient not taking: Reported on 04/23/2015 10/04/14   Jerilee Field, MD   Physical Exam: Filed Vitals:   04/23/15 1800 04/23/15 1830 04/23/15 1900 04/23/15 1913  BP: 111/66 85/62 110/53   Pulse: 54 70 57   Temp:      TempSrc:      Resp:  16 9   Height:     (1.727 m)  Weight:    90.719 kg (200 lb)  SpO2: 96% 92% 86%     Wt Readings from Last 3 Encounters:  04/23/15 90.719 kg (200 lb)  10/02/14 82.243 kg (181 lb 5 oz)  08/01/14 77.565 kg (171 lb)    General:  Appears calm and comfortable Eyes: PERRL, normal lids, irises & conjunctiva ENT: grossly normal hearing, lips & tongue  are dry. Neck: no LAD, masses or thyromegaly Cardiovascular: Irregularly irregular, positive anasarca. Telemetry: SR, atrial fibrillation  Respiratory: CTA bilaterally, no w/r/r. Normal respiratory effort. Abdomen: soft, ntnd Skin: no rash or induration seen on limited exam Musculoskeletal: Decreased muscle tone Psychiatric: Lethargic, but answers simple questions briefly after being awakened.. Neurologic: Unable to fully evaluate due to the patient's clinical status           Labs on Admission:  Basic Metabolic Panel:  Recent Labs Lab 04/23/15 1714  NA 141  K 5.0  CL 110  CO2 21*  GLUCOSE 79  BUN 57*  CREATININE 1.18  CALCIUM 9.3   Liver Function Tests: No results  for input(s): AST, ALT, ALKPHOS, BILITOT, PROT, ALBUMIN in the last 168 hours. No results for input(s): LIPASE, AMYLASE in the last 168 hours. No results for input(s): AMMONIA in the last 168 hours. CBC:  Recent Labs Lab 04/23/15 1714  WBC 8.9  HGB 11.7*  HCT 35.8*  MCV 96.5  PLT 110*   Cardiac Enzymes: No results for input(s): CKTOTAL, CKMB, CKMBINDEX, TROPONINI in the last 168 hours.  BNP (last 3 results)  Recent Labs  04/23/15 1714  BNP 307.1*    ProBNP (last 3 results)  Recent Labs  05/30/14 1005  PROBNP 3632.0*    CBG: No results for input(s): GLUCAP in the last 168 hours.  Radiological Exams on Admission: Dg Chest Portable 1 View  04/23/2015   CLINICAL DATA:  Swelling, history TIA, atrial fibrillation, chronic kidney disease, multiple sclerosis, former smoker  EXAM: PORTABLE CHEST - 1 VIEW  COMPARISON:  Portable exam 1740 hours compared to 05/30/2014  FINDINGS: Enlargement of cardiac silhouette.  Rotated to the RIGHT, which may account for RIGHT hilar prominence.  Diffuse BILATERAL pulmonary infiltrates question edema versus infection.  No gross pleural effusion or pneumothorax.  Bones demineralized.  IMPRESSION: Enlargement of cardiac silhouette with pulmonary vascular congestion.   Diffuse BILATERAL pulmonary infiltrates question edema versus infection.   Electronically Signed   By: Ulyses Southward M.D.   On: 04/23/2015 17:44    Echocardiogram: 06/01/2014 ------------------------------------------------------------------- Study Conclusions  - Left ventricle: The cavity size was normal. Wall thickness was normal. Systolic function was normal. The estimated ejection fraction was in the range of 60% to 65%. - Aortic valve: AV is thickened, calcified with minimally restricted motion. Peak and mean gradients through the valve are 18 and 10 mm Hg respectively. There was mild regurgitation. - Mitral valve: There was mild regurgitation. - Left atrium: The atrium was severely dilated. - Right atrium: The atrium was mildly dilated. - Pulmonary arteries: PA peak pressure: 42 mm Hg (S).   EKG: Independently reviewed. Vent. rate 61 BPM PR interval * ms QRS duration 113 ms QT/QTc 441/444 ms P-R-T axes -1 6 25   Atrial fibrillation Borderline intraventricular conduction delay Low voltage, extremity and precordial leads.  Assessment/Plan Principal Problem:   Sepsis secondary to UTI Continue IV antibiotics and follow-up blood and urine culture and sensitivity results.  Active Problems:   Chronic atrial fibrillation Resume Toprol once the patient is more alert to swallow tablets or start IV metoprolol.    CHF exacerbation/Anasarca I will start furosemide. Monitor input and output.    Hypothermia Continue heating blanket until resolved.    MS (multiple sclerosis) Supportive care.    Code Status: DO NOT RESUSCITATE DVT Prophylaxis: Lovenox SQ Family Communication:  No family was present at the time of examination. They were contacted and told the patient is in the hospital. This is the contact information that appears on the face sheet. Curtis,Delois Sister [phone]  [phone]   Curtis,Tommy Relative [phone]     Merryl Hacker  [phone]  [phone]     Disposition Plan: Admit to a stepdown, continue IV antibiotics for 3-5 days and reassess disposition.  Time spent: Over 75 minutes.  Bobette Mo [facility] Pager [phone].

## 2015-04-23 NOTE — ED Notes (Signed)
Attempted temp foley insertion - unable to place due to amount of penile swelling. Condom catheter placed.

## 2015-04-24 DIAGNOSIS — N390 Urinary tract infection, site not specified: Secondary | ICD-10-CM

## 2015-04-24 DIAGNOSIS — N3000 Acute cystitis without hematuria: Secondary | ICD-10-CM

## 2015-04-24 DIAGNOSIS — A419 Sepsis, unspecified organism: Principal | ICD-10-CM

## 2015-04-24 DIAGNOSIS — I509 Heart failure, unspecified: Secondary | ICD-10-CM

## 2015-04-24 DIAGNOSIS — J690 Pneumonitis due to inhalation of food and vomit: Secondary | ICD-10-CM

## 2015-04-24 LAB — BASIC METABOLIC PANEL
Anion gap: 8 (ref 5–15)
BUN: 55 mg/dL — AB (ref 6–20)
CHLORIDE: 111 mmol/L (ref 101–111)
CO2: 22 mmol/L (ref 22–32)
CREATININE: 1.25 mg/dL — AB (ref 0.61–1.24)
Calcium: 8.8 mg/dL — ABNORMAL LOW (ref 8.9–10.3)
GFR, EST AFRICAN AMERICAN: 57 mL/min — AB (ref >60)
GFR, EST NON AFRICAN AMERICAN: 49 mL/min — AB (ref >60)
Glucose, Bld: 78 mg/dL (ref 65–99)
Potassium: 4.7 mmol/L (ref 3.5–5.1)
SODIUM: 141 mmol/L (ref 135–145)

## 2015-04-24 LAB — PROTIME-INR
INR: 3.29 — ABNORMAL HIGH (ref 0.00–1.49)
Prothrombin Time: 32.8 seconds — ABNORMAL HIGH (ref 11.6–15.2)

## 2015-04-24 MED ORDER — BACLOFEN 20 MG PO TABS
20.0000 mg | ORAL_TABLET | Freq: Every day | ORAL | Status: DC
  Filled 2015-04-24 (×2): qty 1

## 2015-04-24 MED ORDER — ROPINIROLE HCL 0.5 MG PO TABS
0.5000 mg | ORAL_TABLET | Freq: Every evening | ORAL | Status: DC
  Filled 2015-04-24 (×2): qty 1

## 2015-04-24 MED ORDER — WARFARIN - PHARMACIST DOSING INPATIENT: Freq: Every day | Status: DC

## 2015-04-24 MED ORDER — MIDODRINE HCL 5 MG PO TABS: 5.0000 mg | ORAL_TABLET | Freq: Three times a day (TID) | ORAL | Status: DC

## 2015-04-24 MED ORDER — POLYSACCHARIDE IRON COMPLEX 150 MG PO CAPS
150.0000 mg | ORAL_CAPSULE | Freq: Every day | ORAL | Status: DC
  Filled 2015-04-24 (×2): qty 1

## 2015-04-24 MED ORDER — FENTANYL CITRATE (PF) 100 MCG/2ML IJ SOLN
12.5000 ug | INTRAMUSCULAR | Status: DC | PRN
  Filled 2015-04-24: qty 2

## 2015-04-24 MED ORDER — IPRATROPIUM BROMIDE 0.02 % IN SOLN
0.5000 mg | Freq: Three times a day (TID) | RESPIRATORY_TRACT | Status: DC
  Administered 2015-04-25: 0.5 mg via RESPIRATORY_TRACT
  Filled 2015-04-24: qty 2.5

## 2015-04-24 MED ORDER — CHLORHEXIDINE GLUCONATE 0.12 % MT SOLN
15.0000 mL | Freq: Two times a day (BID) | OROMUCOSAL | Status: DC
  Administered 2015-04-24 (×2): 15 mL via OROMUCOSAL

## 2015-04-24 MED ORDER — TAMSULOSIN HCL 0.4 MG PO CAPS: 0.4000 mg | ORAL_CAPSULE | Freq: Every day | ORAL | Status: DC

## 2015-04-24 MED ORDER — GLYCOPYRROLATE 0.2 MG/ML IJ SOLN
0.4000 mg | INTRAMUSCULAR | Status: DC
  Administered 2015-04-24 – 2015-04-25 (×5): 0.4 mg via INTRAVENOUS
  Filled 2015-04-24 (×7): qty 2

## 2015-04-24 MED ORDER — FINASTERIDE 5 MG PO TABS: 5.0000 mg | ORAL_TABLET | Freq: Every day | ORAL | Status: DC

## 2015-04-24 MED ORDER — LEVALBUTEROL HCL 1.25 MG/0.5ML IN NEBU
1.2500 mg | INHALATION_SOLUTION | Freq: Three times a day (TID) | RESPIRATORY_TRACT | Status: DC
  Administered 2015-04-25: 1.25 mg via RESPIRATORY_TRACT
  Filled 2015-04-24: qty 0.5

## 2015-04-24 MED ORDER — WHITE PETROLATUM GEL
Status: AC
  Administered 2015-04-24: 0.2
  Filled 2015-04-24: qty 1

## 2015-04-24 NOTE — Progress Notes (Signed)
ANTICOAGULATION CONSULT NOTE - Initial Consult  Pharmacy Consult for Coumadin Indication: atrial fibrillation  No Known Allergies  Patient Measurements: Height:  (172.7 cm) Weight: 228 lb 9.9 oz (103.7 kg) IBW/kg (Calculated) : 68.4  Vital Signs: Temp: 97 F (36.1 C) (08/24 0400) Temp Source: Rectal (08/24 0400) BP: 115/59 mmHg (08/24 0452) Pulse Rate: 97 (08/24 0452)  Labs:  Recent Labs  04/23/15 1714 04/24/15 0222  HGB 11.7*  --   HCT 35.8*  --   PLT 110*  --   LABPROT 36.4*  --   INR 3.78*  --   CREATININE 1.18 1.25*    Estimated Creatinine Clearance: 46.8 mL/min (by C-G formula based on Cr of 1.25).   Medical History: Past Medical History  Diagnosis Date  . Stroke 08/22/2012    residual left sided weakness (08/23/2012)  . TIA (transient ischemic attack)   . Chronic atrial fibrillation     Not on coumadin as he was deemed a fall risk after he fell in his cardiologists office.  Marland Kitchen Dysrhythmia     Atrial Fib  . Carpal tunnel syndrome     diagnosed in 1997  . Chronic kidney disease     Urinary retention  . DNR (do not resuscitate)   . BPH (benign prostatic hypertrophy)   . Left-sided weakness     "since the 1980's"  . DDD (degenerative disc disease), cervical   . Spinal stenosis of lumbar region   . MS (multiple sclerosis)   . Unable to ambulate     Medications:  Prescriptions prior to admission  Medication Sig Dispense Refill Last Dose  . Amino Acids-Protein Hydrolys (FEEDING SUPPLEMENT, PRO-STAT SUGAR FREE 64,) LIQD Take 30 mLs by mouth 2 (two) times daily.   Past Week at Unknown time  . baclofen (LIORESAL) 20 MG tablet Take 20 mg by mouth at bedtime.   04/22/2015 at Unknown time  . bisacodyl (DULCOLAX) 5 MG EC tablet Take 5 mg by mouth daily as needed for moderate constipation.   unknown at unknown  . docusate sodium 100 MG CAPS Take 100 mg by mouth 2 (two) times daily. 10 capsule 0 04/23/2015 at Unknown time  . finasteride (PROSCAR) 5 MG tablet  Take 1 tablet (5 mg total) by mouth daily.   04/23/2015 at Unknown time  . furosemide (LASIX) 40 MG tablet Take 40 mg by mouth daily.   04/23/2015 at Unknown time  . HYDROcodone-acetaminophen (NORCO/VICODIN) 5-325 MG per tablet Take 1-2 tablets by mouth every 4 (four) hours as needed for moderate pain. (Patient taking differently: Take 1 tablet by mouth every 4 (four) hours as needed for moderate pain. ) 60 tablet 0 04/23/2015 at Unknown time  . iron polysaccharides (NIFEREX) 150 MG capsule Take 150 mg by mouth daily.   04/23/2015 at Unknown time  . losartan (COZAAR) 50 MG tablet Take 50 mg by mouth every morning.    04/23/2015 at Unknown time  . metoprolol tartrate (LOPRESSOR) 25 MG tablet Take 0.5 tablets (12.5 mg total) by mouth 2 (two) times daily. (Patient taking differently: Take 12.5 mg by mouth every morning. ) 30 tablet 11 Past Week at 0900  . polyethylene glycol (MIRALAX / GLYCOLAX) packet Take 17 g by mouth daily. 14 each 0 04/23/2015 at Unknown time  . rOPINIRole (REQUIP) 0.5 MG tablet Take 0.5 mg by mouth every evening.   04/22/2015 at Unknown time  . senna (SENOKOT) 8.6 MG TABS tablet Take 1 tablet by mouth daily.   04/23/2015 at  Unknown time  . tamsulosin (FLOMAX) 0.4 MG CAPS capsule Take 1 capsule (0.4 mg total) by mouth daily after breakfast. 30 capsule  04/23/2015 at Unknown time  . warfarin (COUMADIN) 1 MG tablet Take 1 tablet (1 mg total) by mouth daily. (Patient taking differently: Take 1 mg by mouth See admin instructions. )   04/23/2015 at Unknown time  . warfarin (COUMADIN) 1 MG tablet Take 0.5 mg by mouth See admin instructions. Take 0.5mg  tablet by mouth on Wednesday and Saturday   Past Week at Unknown time  . [DISCONTINUED] baclofen (LIORESAL) 10 MG tablet One to 2 tabs po qhs (Patient not taking: Reported on 04/23/2015) 60 each 11 Not Taking at Unknown time  . [DISCONTINUED] enoxaparin (LOVENOX) 60 MG/0.6ML injection Inject 0.6 mLs (60 mg total) into the skin daily. (Patient not taking:  Reported on 04/23/2015) 0 Syringe  Not Taking at Unknown time  . [DISCONTINUED] warfarin (COUMADIN) 4 MG tablet Take 1 tablet (4 mg total) by mouth as directed. (Patient not taking: Reported on 04/23/2015)   Not Taking at Unknown time    Assessment: 81 y.o. male sent from NH with swelling. Pt on coumadin for afib, h/o CVA. Home dose: 1 mg on Sun, Mon, Tues, Thur, Fri and 0.5mg  on Wed and Sat. Last dose on 8/23. Admit INR 3.78 (supratherapeutic). Hgb ok, plt 110 - watch.  Goal of Therapy:  INR 2-3 Monitor platelets by anticoagulation protocol: Yes   Plan:  Daily PT/INR No coumadin today  Christoper Fabian, PharmD, BCPS Clinical pharmacist, pager [phone] 04/24/2015,6:58 AM

## 2015-04-24 NOTE — Clinical Documentation Improvement (Addendum)
Internal Medicine  #1:  Can the diagnosis of CHF be further specified?    Acuity - Acute, Chronic, Acute on Chronic   Type - Systolic, Diastolic, Systolic and Diastolic  Other  Clinically Undetermined  Document any associated diagnoses/conditions  Supporting Information: 'CHF' currently documented in chart; admitted with edema, elevated BNP. Furosemide ordered.    #2:  Can the diagnosis of CKD be further specified?   CKD Stage I - GFR greater than or equal to 90  CKD Stage II - GFR 60-89  CKD Stage III - GFR 30-59  CKD Stage IV - GFR 15-29  CKD Stage V - GFR < 15  ESRD (End Stage Renal Disease)  Other condition  Unable to clinically determine  Please exercise your independent, professional judgment when responding. A specific answer is not anticipated or expected.  Thank you, Doy Mince, RN [phone] Clinical Documentation Specialist  Thank Vivien Rossetti [employer] Information Management [place]

## 2015-04-24 NOTE — Progress Notes (Signed)
Pt unable to follow commands. Sputum sample UTO at this time.

## 2015-04-24 NOTE — Progress Notes (Signed)
Patient arrived via stretcher from ED.Assisted to bed by nursing staff.Patient temperature remains low currently 93 rectally remains on bair hugger.Will continue to monitor.

## 2015-04-24 NOTE — Progress Notes (Addendum)
PATIENT DETAILS Name: Erik Arnold Age: 81 y.o. Sex: male Date of Birth: 07/02/1924 Admit Date: 04/23/2015 Admitting Physician Bobette Mo, MD ZOX:[id number] G, MD  Subjective: Very lethargic this morning-"gurgling". Opens eyes, but not very verbal-mumbles incoherently.  Assessment/Plan: Principal Problem: Sepsis: secondary to UTI and aspiration pneumonia.Very lethargic and encephalopathic, continue vancomycin and Zosyn. Await culture results.  Active Problems: Aspiration pneumonia: Keep nothing by mouth till SLP evaluation. Seems to be overtly aspirating his secretions-appears to have significant pooling of his secretions. Continue antibiotics. Suspect long-standing history of underlying aspiration-currently worsened with acute illness. If no improvement, will recommend transitioning to hospice/comfort care.   UTI: Continue antibiotics and await cultures.  Encephalopathy: Secondary to sepsis/UTI and pneumonia. Continue antibiotics and follow clinical course.  Acute diastolic heart failure: Blood pressure soft for aggressive diureses-start low-dose Midodrine and attempt diuresis much as possible.  Anasarca: Likely secondary to above, but suspect hypoalbuminemia also playing a role-check albumen levels.  Acute renal failure: Suspect prerenal azotemia, and a setting of sepsis. Unfortunately-complicated by development of congestive heart failure-follow electrolytes closely. Supportive care for now.  Thrombocytopenia: Likely secondary to sepsis. Follow.  Atrial fibrillation: Continue Coumadin-suspect not a very good long-term candidate reviewed outpatient cardiology note (06/20/14)-apparently was not on Coumadin due to fall risk-unclear when it was restarted. For now cautiously continue, depending on clinical course, will make further recommendations. Resume metoprolol when blood pressure more stable.  History of multiple sclerosis: Not on any disease modifying  agents. Supportive care only.  BPH: Continue with Flomax-have asked RN to insert Foley for close I&O's, urine output monitoring and also patient acutely ill.  Palliative care: DO NOT RESUSCITATE in place. This M.D. spoke with patient's sister/HPOA  (Delois Lyda Jester) over the phone-explained that patient is acutely ill, explained poor overall prognoses. Family understands limitation in care-and is agreeable in no further escalation of care in case patient continues to deteriorate. Family understands that we will not place a central line and start pressors.Family also understands that if no improvement-may need to consider transitioning to hospice/comfort care .I have also consulted palliative care.  Disposition: Remain inpatient  Antimicrobial agents  See below  Anti-infectives    Start     Dose/Rate Route Frequency Ordered Stop   04/24/15 0600  vancomycin (VANCOCIN) IVPB 750 mg/150 ml premix     750 mg 150 mL/hr over 60 Minutes Intravenous Every 12 hours 04/23/15 1928     04/24/15 0130  piperacillin-tazobactam (ZOSYN) IVPB 3.375 g     3.375 g 12.5 mL/hr over 240 Minutes Intravenous Every 8 hours 04/23/15 1905     04/23/15 2200  piperacillin-tazobactam (ZOSYN) IVPB 3.375 g  Status:  Discontinued     3.375 g 100 mL/hr over 30 Minutes Intravenous 3 times per day 04/23/15 1809 04/23/15 1905   04/23/15 1915  piperacillin-tazobactam (ZOSYN) IVPB 3.375 g     3.375 g 100 mL/hr over 30 Minutes Intravenous  Once 04/23/15 1905 04/23/15 1945   04/23/15 1900  vancomycin (VANCOCIN) IVPB 1000 mg/200 mL premix     1,000 mg 200 mL/hr over 60 Minutes Intravenous  Once 04/23/15 1824 04/23/15 1953      DVT Prophylaxis: Coumadin  Code Status:  DNR  Family Communication Sister over the phone  Procedures: None  CONSULTS:  Palliative care  Time spent 40 minutes-Greater than 50% of this time was spent in counseling, explanation of diagnosis, planning of further management, and coordination of  care.  MEDICATIONS: Scheduled Meds: . antiseptic oral rinse  7 mL Mouth Rinse BID  . baclofen  20 mg Oral QHS  . chlorhexidine  15 mL Mouth/Throat BID  . finasteride  5 mg Oral Daily  . furosemide  20 mg Intravenous Q12H  . ipratropium  0.5 mg Nebulization Q6H  . iron polysaccharides  150 mg Oral Daily  . levalbuterol  1.25 mg Nebulization Q6H  . piperacillin-tazobactam (ZOSYN)  IV  3.375 g Intravenous Q8H  . rOPINIRole  0.5 mg Oral QPM  . tamsulosin  0.4 mg Oral QPC breakfast  . vancomycin  750 mg Intravenous Q12H  . Warfarin - Pharmacist Dosing Inpatient   Does not apply q1800   Continuous Infusions:  PRN Meds:.    PHYSICAL EXAM: Vital signs in last 24 hours: Filed Vitals:   04/24/15 0900 04/24/15 0916 04/24/15 1100 04/24/15 1138  BP: 84/30 101/70 89/43   Pulse: 95 120  97  Temp: 98.2 F (36.8 C)   98.8 F (37.1 C)  TempSrc: Oral   Oral  Resp: 26 35  37  Height:      Weight:      SpO2: 97% 98%  98%    Weight change:  Filed Weights   04/23/15 1913 04/23/15 2120  Weight: 90.719 kg (200 lb) 103.7 kg (228 lb 9.9 oz)   Body mass index is 34.77 kg/(m^2).   Gen Exam: Awake but very lethargic, not following commands-does track movement with eyes at times Neck: Supple  Chest: Bibasilar rales CVS: S1 S2 irregular Abdomen: soft, BS +, non tender, non distended.  Extremities: +++ edema, lower extremities warm to touch. Neurologic: Unable to assess-not following commands Skin: No Rash.    Intake/Output from previous day:  Intake/Output Summary (Last 24 hours) at 04/24/15 1253 Last data filed at 04/24/15 0914  Gross per 24 hour  Intake    500 ml  Output    500 ml  Net      0 ml     LAB RESULTS: CBC  Recent Labs Lab 04/23/15 1714  WBC 8.9  HGB 11.7*  HCT 35.8*  PLT 110*  MCV 96.5  MCH 31.5  MCHC 32.7  RDW 16.0*    Chemistries   Recent Labs Lab 04/23/15 1714 04/24/15 0222  NA 141 141  K 5.0 4.7  CL 110 111  CO2 21* 22  GLUCOSE 79 78  BUN  57* 55*  CREATININE 1.18 1.25*  CALCIUM 9.3 8.8*    CBG: No results for input(s): GLUCAP in the last 168 hours.  GFR Estimated Creatinine Clearance: 46.8 mL/min (by C-G formula based on Cr of 1.25).  Coagulation profile  Recent Labs Lab 04/23/15 1714 04/24/15 0730  INR 3.78* 3.29*    Cardiac Enzymes No results for input(s): CKMB, TROPONINI, MYOGLOBIN in the last 168 hours.  Invalid input(s): CK  Invalid input(s): POCBNP No results for input(s): DDIMER in the last 72 hours. No results for input(s): HGBA1C in the last 72 hours. No results for input(s): CHOL, HDL, LDLCALC, TRIG, CHOLHDL, LDLDIRECT in the last 72 hours. No results for input(s): TSH, T4TOTAL, T3FREE, THYROIDAB in the last 72 hours.  Invalid input(s): FREET3 No results for input(s): VITAMINB12, FOLATE, FERRITIN, TIBC, IRON, RETICCTPCT in the last 72 hours. No results for input(s): LIPASE, AMYLASE in the last 72 hours.  Urine Studies No results for input(s): UHGB, CRYS in the last 72 hours.  Invalid input(s): UACOL, UAPR, USPG, UPH, UTP, UGL, UKET, UBIL, UNIT, UROB, ULEU,  UEPI, UWBC, URBC, UBAC, CAST, UCOM, BILUA  MICROBIOLOGY: Recent Results (from the past 240 hour(s))  Urine culture     Status: None (Preliminary result)   Collection Time: 04/23/15  5:31 PM  Result Value Ref Range Status   Specimen Description URINE, RANDOM  Final   Special Requests NONE  Final   Culture >=100,000 COLONIES/mL GRAM NEGATIVE RODS  Final   Report Status PENDING  Incomplete  MRSA PCR Screening     Status: None   Collection Time: 04/23/15  9:34 PM  Result Value Ref Range Status   MRSA by PCR NEGATIVE NEGATIVE Final    Comment:        The GeneXpert MRSA Assay (FDA approved for NASAL specimens only), is one component of a comprehensive MRSA colonization surveillance program. It is not intended to diagnose MRSA infection nor to guide or monitor treatment for MRSA infections.     RADIOLOGY STUDIES/RESULTS: Dg Chest  Portable 1 View  04/23/2015   CLINICAL DATA:  Swelling, history TIA, atrial fibrillation, chronic kidney disease, multiple sclerosis, former smoker  EXAM: PORTABLE CHEST - 1 VIEW  COMPARISON:  Portable exam 1740 hours compared to 05/30/2014  FINDINGS: Enlargement of cardiac silhouette.  Rotated to the RIGHT, which may account for RIGHT hilar prominence.  Diffuse BILATERAL pulmonary infiltrates question edema versus infection.  No gross pleural effusion or pneumothorax.  Bones demineralized.  IMPRESSION: Enlargement of cardiac silhouette with pulmonary vascular congestion.  Diffuse BILATERAL pulmonary infiltrates question edema versus infection.   Electronically Signed   By: Ulyses Southward M.D.   On: 04/23/2015 17:44    Jeoffrey Massed, MD  [facility] Pager:[phone]  If 7PM-7AM, please contact night-coverage [email or web address] Password [facility] 04/24/2015, 12:53 PM   LOS: 1 day

## 2015-04-24 NOTE — Progress Notes (Signed)
Utilization review completed. Brydan Downard, RN, BSN. 

## 2015-04-24 NOTE — Progress Notes (Signed)
ANTICOAGULATION CONSULT NOTE - Follow Up Consult  Pharmacy Consult for coumadin Indication: atrial fibrillation  No Known Allergies  Patient Measurements: Height: 5\' 8"  (172.7 cm) Weight: 228 lb 9.9 oz (103.7 kg) IBW/kg (Calculated) : 68.4  Vital Signs: Temp: 98.8 F (37.1 C) (08/24 1138) Temp Source: Oral (08/24 1138) BP: 89/43 mmHg (08/24 1100) Pulse Rate: 97 (08/24 1138)  Labs:  Recent Labs  04/23/15 1714 04/24/15 0222 04/24/15 0730  HGB 11.7*  --   --   HCT 35.8*  --   --   PLT 110*  --   --   LABPROT 36.4*  --  32.8*  INR 3.78*  --  3.29*  CREATININE 1.18 1.25*  --     Estimated Creatinine Clearance: 46.8 mL/min (by C-G formula based on Cr of 1.25).   Medications:  Scheduled:  . antiseptic oral rinse  7 mL Mouth Rinse BID  . baclofen  20 mg Oral QHS  . chlorhexidine  15 mL Mouth/Throat BID  . finasteride  5 mg Oral Daily  . furosemide  20 mg Intravenous Q12H  . ipratropium  0.5 mg Nebulization Q6H  . iron polysaccharides  150 mg Oral Daily  . levalbuterol  1.25 mg Nebulization Q6H  . piperacillin-tazobactam (ZOSYN)  IV  3.375 g Intravenous Q8H  . rOPINIRole  0.5 mg Oral QPM  . tamsulosin  0.4 mg Oral QPC breakfast  . vancomycin  750 mg Intravenous Q12H  . Warfarin - Pharmacist Dosing Inpatient   Does not apply q1800    Assessment: 81 y.o. male sent from NH with HF exacerbation. Pt on coumadin for afib, h/o CVA. Home dose: 1 mg on Sun, Mon, Tues, Thur, Fri and 0.5mg  on Wed and Sat. Last dose on 8/23. -INR is 3.29  Goal of Therapy:  INR 2-3 Monitor platelets by anticoagulation protocol: Yes   Plan:  -Hold coumadin today -Daily PT/INR  Harland German, Pharm D 04/24/2015 11:51 AM

## 2015-04-24 NOTE — Care Management Note (Signed)
Case Management Note  Patient Details  Name: Erik Arnold MRN: [id number] Date of Birth: 1924-02-07  Subjective/Objective:     Adm w sepsis               Action/Plan:lives w fam pta, pcp dr Jarome Matin   Expected Discharge Date:                  Expected Discharge Plan:     In-House Referral:  Hospice / Palliative Care  Discharge planning Services     Post Acute Care Choice:    Choice offered to:     DME Arranged:    DME Agency:     HH Arranged:    HH Agency:     Status of Service:     Medicare Important Message Given:    Date Medicare IM Given:    Medicare IM give by:    Date Additional Medicare IM Given:    Additional Medicare Important Message give by:     If discussed at Long Length of Stay Meetings, dates discussed:    Additional Comments: pal care to see pt/fam  [name], RN 04/24/2015, 1:20 PM

## 2015-04-25 DIAGNOSIS — G35 Multiple sclerosis: Secondary | ICD-10-CM

## 2015-04-25 DIAGNOSIS — I482 Chronic atrial fibrillation: Secondary | ICD-10-CM

## 2015-04-25 DIAGNOSIS — R601 Generalized edema: Secondary | ICD-10-CM

## 2015-04-25 LAB — COMPREHENSIVE METABOLIC PANEL
ALBUMIN: 2.7 g/dL — AB (ref 3.5–5.0)
ALT: 23 U/L (ref 17–63)
ANION GAP: 8 (ref 5–15)
AST: 19 U/L (ref 15–41)
Alkaline Phosphatase: 114 U/L (ref 38–126)
BILIRUBIN TOTAL: 1 mg/dL (ref 0.3–1.2)
BUN: 47 mg/dL — AB (ref 6–20)
CHLORIDE: 110 mmol/L (ref 101–111)
CO2: 26 mmol/L (ref 22–32)
Calcium: 8.7 mg/dL — ABNORMAL LOW (ref 8.9–10.3)
Creatinine, Ser: 1.56 mg/dL — ABNORMAL HIGH (ref 0.61–1.24)
GFR calc Af Amer: 44 mL/min — ABNORMAL LOW (ref >60)
GFR calc non Af Amer: 38 mL/min — ABNORMAL LOW (ref >60)
GLUCOSE: 94 mg/dL (ref 65–99)
POTASSIUM: 4.6 mmol/L (ref 3.5–5.1)
SODIUM: 144 mmol/L (ref 135–145)
TOTAL PROTEIN: 6.8 g/dL (ref 6.5–8.1)

## 2015-04-25 LAB — CBC
HEMATOCRIT: 34 % — AB (ref 39.0–52.0)
Hemoglobin: 10.9 g/dL — ABNORMAL LOW (ref 13.0–17.0)
MCH: 30.7 pg (ref 26.0–34.0)
MCHC: 32.1 g/dL (ref 30.0–36.0)
MCV: 95.8 fL (ref 78.0–100.0)
PLATELETS: 118 10*3/uL — AB (ref 150–400)
RBC: 3.55 MIL/uL — ABNORMAL LOW (ref 4.22–5.81)
RDW: 16.5 % — AB (ref 11.5–15.5)
WBC: 6.6 10*3/uL (ref 4.0–10.5)

## 2015-04-25 LAB — URINE CULTURE

## 2015-04-25 LAB — PROTIME-INR
INR: 3.37 — AB (ref 0.00–1.49)
PROTHROMBIN TIME: 33.4 s — AB (ref 11.6–15.2)

## 2015-04-25 MED ORDER — MORPHINE SULFATE (CONCENTRATE) 10 MG/0.5ML PO SOLN: 5.0000 mg | ORAL | Status: DC | PRN

## 2015-04-25 MED ORDER — SCOPOLAMINE 1 MG/3DAYS TD PT72
1.0000 | MEDICATED_PATCH | TRANSDERMAL | Status: DC
  Administered 2015-04-25: 1.5 mg via TRANSDERMAL
  Filled 2015-04-25: qty 1

## 2015-04-25 MED ORDER — LORAZEPAM 2 MG/ML PO CONC: 1.0000 mg | ORAL | Status: AC | PRN

## 2015-04-25 MED ORDER — MORPHINE SULFATE (CONCENTRATE) 10 MG/0.5ML PO SOLN: 5.0000 mg | ORAL | Status: AC | PRN

## 2015-04-25 MED ORDER — MIDODRINE HCL 5 MG PO TABS: 10.0000 mg | ORAL_TABLET | Freq: Three times a day (TID) | ORAL | Status: DC

## 2015-04-25 MED ORDER — ATROPINE SULFATE 1 % OP SOLN
2.0000 [drp] | Freq: Four times a day (QID) | OPHTHALMIC | Status: DC
  Administered 2015-04-25: 2 [drp] via SUBLINGUAL
  Filled 2015-04-25: qty 2

## 2015-04-25 MED ORDER — IPRATROPIUM-ALBUTEROL 0.5-2.5 (3) MG/3ML IN SOLN: 3.0000 mL | Freq: Three times a day (TID) | RESPIRATORY_TRACT | Status: DC

## 2015-04-25 MED ORDER — IPRATROPIUM-ALBUTEROL 0.5-2.5 (3) MG/3ML IN SOLN: 3.0000 mL | Freq: Three times a day (TID) | RESPIRATORY_TRACT | Status: AC

## 2015-04-25 MED ORDER — FUROSEMIDE 40 MG PO TABS
40.0000 mg | ORAL_TABLET | Freq: Every day | ORAL | Status: DC
  Administered 2015-04-25: 40 mg via ORAL
  Filled 2015-04-25: qty 1

## 2015-04-25 MED ORDER — ALBUTEROL SULFATE (2.5 MG/3ML) 0.083% IN NEBU: 2.5000 mg | INHALATION_SOLUTION | RESPIRATORY_TRACT | Status: DC | PRN

## 2015-04-25 MED ORDER — SCOPOLAMINE 1 MG/3DAYS TD PT72: 1.0000 | MEDICATED_PATCH | TRANSDERMAL | Status: AC

## 2015-04-25 MED ORDER — LORAZEPAM 2 MG/ML PO CONC
1.0000 mg | ORAL | Status: DC | PRN
Start: 2015-04-25 — End: 2015-04-25

## 2015-04-25 MED ORDER — ALBUTEROL SULFATE (2.5 MG/3ML) 0.083% IN NEBU: 2.5000 mg | INHALATION_SOLUTION | RESPIRATORY_TRACT | Status: AC | PRN

## 2015-04-25 MED ORDER — ATROPINE SULFATE 1 % OP SOLN: 2.0000 [drp] | Freq: Four times a day (QID) | OPHTHALMIC | Status: AC

## 2015-04-25 NOTE — Progress Notes (Addendum)
PATIENT DETAILS Name: Erik Arnold Age: 81 y.o. Sex: male Date of Birth: [birth year] Admit Date: 04/23/2015 Admitting Physician Bobette Mo, MD FFM:[id number] G, MD  Subjective: More awake-follows some commands-still seems to be accumulating secretions.  Assessment/Plan: Principal Problem: Sepsis: secondary to UTI and aspiration pneumonia.Less encephalopathic today, continue vancomycin and Zosyn. Await culture results.  Active Problems: Aspiration pneumonia: Keep NPO till SLP evaluation. Seems to be overtly aspirating his secretions-appears to have significant pooling of his secretions. Continue antibiotics. Suspect long-standing history of underlying aspiration-currently worsened with acute illness. If no improvement, will recommend transitioning to hospice/comfort care.   UTI: Continue antibiotics and await cultures.  Encephalopathy: Secondary to sepsis/UTI and pneumonia. Improved, more awake/alert.Continue antibiotics and follow clinical course.  Acute diastolic heart failure: Improved with less edema. Blood pressure soft for aggressive diureses-increase Midodrine to 10 mg TID and attempt diuresis much as possible-great urine output of 3L over the past 24 hours. Still with excessive volume on board. Watch creatinine-slightly increased today.  Anasarca: Likely secondary to above, but suspect hypoalbuminemia also playing a role  Acute renal failure: Suspect prerenal azotemia, and a setting of sepsis- Unfortunately-complicated by development of congestive heart failure. Increase Midodrine to see  Support BP and increase renal perfusion. Follow electrolytes closely.  Thrombocytopenia: Likely secondary to sepsis. Follow.  Atrial fibrillation: Continue Coumadin-suspect not a very good long-term candidate reviewed outpatient cardiology note (06/20/14)-apparently was not on Coumadin due to fall risk-unclear when it was restarted. For now cautiously continue,  depending on clinical course, will make further recommendations. Resume metoprolol when blood pressure more stable.  History of multiple sclerosis: Not on any disease modifying agents. Supportive care only.  BPH: Continue with Flomax-continue Foley for close I&O's-if improves further-voiding trial prior to d/c-if hospice/comfort care-then will keep foley for comfort.  Palliative care: DO NOT RESUSCITATE in place. This M.D. spoke with patient's sister/HPOA  (Erik Arnold) over the phone on 8/24-explained that patient is acutely ill, explained poor overall prognoses. Family understands limitation in care-and is agreeable in no further escalation of care in case patient continues to deteriorate. Family also understands that if no improvement-may need to consider transitioning to hospice/comfort care .Await input from palliative care.  Disposition: Remain inpatient-transfer to med-surg  Addendum 9:30 am Spoke with palliative care-who spoke with family for goals of care-recommendations are to transfer back to SNF with hospice, goals are for total comfort care.Have notified social work.  Antimicrobial agents  See below  Anti-infectives    Start     Dose/Rate Route Frequency Ordered Stop   04/24/15 0600  vancomycin (VANCOCIN) IVPB 750 mg/150 ml premix     750 mg 150 mL/hr over 60 Minutes Intravenous Every 12 hours 04/23/15 1928     04/24/15 0130  piperacillin-tazobactam (ZOSYN) IVPB 3.375 g     3.375 g 12.5 mL/hr over 240 Minutes Intravenous Every 8 hours 04/23/15 1905     04/23/15 2200  piperacillin-tazobactam (ZOSYN) IVPB 3.375 g  Status:  Discontinued     3.375 g 100 mL/hr over 30 Minutes Intravenous 3 times per day 04/23/15 1809 04/23/15 1905   04/23/15 1915  piperacillin-tazobactam (ZOSYN) IVPB 3.375 g     3.375 g 100 mL/hr over 30 Minutes Intravenous  Once 04/23/15 1905 04/23/15 1945   04/23/15 1900  vancomycin (VANCOCIN) IVPB 1000 mg/200 mL premix     1,000 mg 200 mL/hr over 60  Minutes Intravenous  Once 04/23/15 1824  04/23/15 1953      DVT Prophylaxis: Coumadin  Code Status:  DNR  Family Communication None at bedside  Procedures: None  CONSULTS:  Palliative care  Time spent 30  minutes-Greater than 50% of this time was spent in counseling, explanation of diagnosis, planning of further management, and coordination of care.  MEDICATIONS: Scheduled Meds: . antiseptic oral rinse  7 mL Mouth Rinse BID  . baclofen  20 mg Oral QHS  . chlorhexidine  15 mL Mouth/Throat BID  . finasteride  5 mg Oral Daily  . furosemide  20 mg Intravenous Q12H  . glycopyrrolate  0.4 mg Intravenous Q4H  . ipratropium  0.5 mg Nebulization TID  . iron polysaccharides  150 mg Oral Daily  . levalbuterol  1.25 mg Nebulization TID  . midodrine  5 mg Oral TID WC  . piperacillin-tazobactam (ZOSYN)  IV  3.375 g Intravenous Q8H  . rOPINIRole  0.5 mg Oral QPM  . tamsulosin  0.4 mg Oral QPC breakfast  . vancomycin  750 mg Intravenous Q12H  . Warfarin - Pharmacist Dosing Inpatient   Does not apply q1800   Continuous Infusions:  PRN Meds:.    PHYSICAL EXAM: Vital signs in last 24 hours: Filed Vitals:   04/25/15 0600 04/25/15 0703 04/25/15 0728 04/25/15 0745  BP: 89/42 100/44    Pulse: 80 79    Temp:   97.3 F (36.3 C)   TempSrc:   Axillary   Resp: 17 24    Height:      Weight:      SpO2: 95% 99%  97%    Weight change: 7.98 kg (17 lb 9.5 oz) Filed Weights   04/23/15 1913 04/23/15 2120 04/25/15 0305  Weight: 90.719 kg (200 lb) 103.7 kg (228 lb 9.9 oz) 98.7 kg (217 lb 9.5 oz)   Body mass index is 33.09 kg/(m^2).   Gen Exam: Awake and more alert. Still pooling secretions. Neck: Supple  Chest: Bibasilar rales CVS: S1 S2 irregular Abdomen: soft, BS +, non tender, non distended.  Extremities: ++ edema, lower extremities warm to touch. Skin: No Rash.    Intake/Output from previous day:  Intake/Output Summary (Last 24 hours) at 04/25/15 0750 Last data filed at  04/25/15 0600  Gross per 24 hour  Intake    300 ml  Output   3320 ml  Net  -3020 ml     LAB RESULTS: CBC  Recent Labs Lab 04/23/15 1714 04/25/15 0355  WBC 8.9 6.6  HGB 11.7* 10.9*  HCT 35.8* 34.0*  PLT 110* 118*  MCV 96.5 95.8  MCH 31.5 30.7  MCHC 32.7 32.1  RDW 16.0* 16.5*    Chemistries   Recent Labs Lab 04/23/15 1714 04/24/15 0222 04/25/15 0355  NA 141 141 144  K 5.0 4.7 4.6  CL 110 111 110  CO2 21* 22 26  GLUCOSE 79 78 94  BUN 57* 55* 47*  CREATININE 1.18 1.25* 1.56*  CALCIUM 9.3 8.8* 8.7*    CBG: No results for input(s): GLUCAP in the last 168 hours.  GFR Estimated Creatinine Clearance: 36.6 mL/min (by C-G formula based on Cr of 1.56).  Coagulation profile  Recent Labs Lab 04/23/15 1714 04/24/15 0730 04/25/15 0355  INR 3.78* 3.29* 3.37*    Cardiac Enzymes No results for input(s): CKMB, TROPONINI, MYOGLOBIN in the last 168 hours.  Invalid input(s): CK  Invalid input(s): POCBNP No results for input(s): DDIMER in the last 72 hours. No results for input(s): HGBA1C in the last 72  hours. No results for input(s): CHOL, HDL, LDLCALC, TRIG, CHOLHDL, LDLDIRECT in the last 72 hours. No results for input(s): TSH, T4TOTAL, T3FREE, THYROIDAB in the last 72 hours.  Invalid input(s): FREET3 No results for input(s): VITAMINB12, FOLATE, FERRITIN, TIBC, IRON, RETICCTPCT in the last 72 hours. No results for input(s): LIPASE, AMYLASE in the last 72 hours.  Urine Studies No results for input(s): UHGB, CRYS in the last 72 hours.  Invalid input(s): UACOL, UAPR, USPG, UPH, UTP, UGL, UKET, UBIL, UNIT, UROB, ULEU, UEPI, UWBC, URBC, UBAC, CAST, UCOM, BILUA  MICROBIOLOGY: Recent Results (from the past 240 hour(s))  Urine culture     Status: None (Preliminary result)   Collection Time: 04/23/15  5:31 PM  Result Value Ref Range Status   Specimen Description URINE, RANDOM  Final   Special Requests NONE  Final   Culture >=100,000 COLONIES/mL ESCHERICHIA COLI   Final   Report Status PENDING  Incomplete  MRSA PCR Screening     Status: None   Collection Time: 04/23/15  9:34 PM  Result Value Ref Range Status   MRSA by PCR NEGATIVE NEGATIVE Final    Comment:        The GeneXpert MRSA Assay (FDA approved for NASAL specimens only), is one component of a comprehensive MRSA colonization surveillance program. It is not intended to diagnose MRSA infection nor to guide or monitor treatment for MRSA infections.     RADIOLOGY STUDIES/RESULTS: Dg Chest Portable 1 View  04/23/2015   CLINICAL DATA:  Swelling, history TIA, atrial fibrillation, chronic kidney disease, multiple sclerosis, former smoker  EXAM: PORTABLE CHEST - 1 VIEW  COMPARISON:  Portable exam 1740 hours compared to 05/30/2014  FINDINGS: Enlargement of cardiac silhouette.  Rotated to the RIGHT, which may account for RIGHT hilar prominence.  Diffuse BILATERAL pulmonary infiltrates question edema versus infection.  No gross pleural effusion or pneumothorax.  Bones demineralized.  IMPRESSION: Enlargement of cardiac silhouette with pulmonary vascular congestion.  Diffuse BILATERAL pulmonary infiltrates question edema versus infection.   Electronically Signed   By: Ulyses Southward M.D.   On: 04/23/2015 17:44    Jeoffrey Massed, MD  [facility] Pager:[phone]  If 7PM-7AM, please contact night-coverage [email or web address] Password [email or web address] 04/25/2015, 7:50 AM   LOS: 2 days

## 2015-04-25 NOTE — Progress Notes (Signed)
Speech Language Pathology Treatment: Dysphagia  Patient Details Name: Erik Arnold MRN: [id number] DOB: 1924-06-30 Today's Date: 04/25/2015 Time: 7182-0990 SLP Time Calculation (min) (ACUTE ONLY): 23 min  Assessment / Plan / Recommendation Clinical Impression  Appearance is better today, significantly decreased wet vocal quality and moderately improved secretion management with fewer oral secretions. He appears to be aspirating all consistencies attempted with delayed cough. Laryngeal elevation during palpation weak. Palliative care reported to RN that family desires comfort feeds. Recommend Dys 1 (puree) texture and nectar thick liquids via teaspoon, allow ice chips and may decide to liberalize once at SNF for thin liquids (?). Recommend sit upright, oral care prior to po's, crush meds. No follow up recommended while in hospital.   HPI Other Pertinent Information: 81 y.o. male with a past medical history of CVA (12/13), chronic atrial fibrillation, chronic kidney disease, multiple sclerosis who is bedbound admitted from nursing home due to edema and increase in tiredness. Found to have sepsios due to UTI. MD suspects pt aspirating his secretions. CXR diffuse BILATERAL pulmonary infiltrates question edema versus infection. No prior notes from ST re: swallow function.   Pertinent Vitals Pain Assessment: No/denies pain  SLP Plan  All goals met;Discharge SLP treatment due to (comment)    Recommendations Diet recommendations: Dysphagia 1 (puree);Nectar-thick liquid (allow ice chips) Liquids provided via: Teaspoon Medication Administration: Crushed with puree Supervision: Full supervision/cueing for compensatory strategies;Staff to assist with self feeding Compensations: Slow rate;Small sips/bites;Check for pocketing Postural Changes and/or Swallow Maneuvers: Seated upright 90 degrees;Upright 30-60 min after meal              Oral Care Recommendations: Oral care QID Follow up Recommendations:  Skilled Nursing facility Plan: All goals met;Discharge SLP treatment due to (comment)         Houston Siren 04/25/2015, 10:20 AM   Orbie Pyo Colvin Caroli.Ed [employer] [phone]

## 2015-04-25 NOTE — Progress Notes (Signed)
Report given to [facility]

## 2015-04-25 NOTE — Evaluation (Signed)
Clinical/Bedside Swallow Evaluation Patient Details  Name: Erik Arnold MRN: [id number] Date of Birth: June 12, 1924  Today's Date: 04/25/2015 Time: SLP Start Time (ACUTE ONLY): 1458 SLP Stop Time (ACUTE ONLY): 1510 SLP Time Calculation (min) (ACUTE ONLY): 12 min  Past Medical History:  Past Medical History  Diagnosis Date  . Stroke 08/22/2012    residual left sided weakness (08/23/2012)  . TIA (transient ischemic attack)   . Chronic atrial fibrillation     Not on coumadin as he was deemed a fall risk after he fell in his cardiologists office.  Marland Kitchen Dysrhythmia     Atrial Fib  . Carpal tunnel syndrome     diagnosed in 1997  . Chronic kidney disease     Urinary retention  . DNR (do not resuscitate)   . BPH (benign prostatic hypertrophy)   . Left-sided weakness     "since the 1980's"  . DDD (degenerative disc disease), cervical   . Spinal stenosis of lumbar region   . MS (multiple sclerosis)   . Unable to ambulate    Past Surgical History:  Past Surgical History  Procedure Laterality Date  . Inguinal hernia repair  ~ 1992    "left" (08/23/2012)  . Cataract extraction w/ intraocular lens  implant, bilateral  ~ 2005  . Proctosigmoidoscopy      November 2001 normal screening proctocolonoscopy to the cecum   . Intramedullary (im) nail intertrochanteric Left 05/21/2014    Procedure: LEFT INTRAMEDULLARY (IM) NAIL INTERTROCHANTRIC;  Surgeon: Cammy Copa, MD;  Location: [facility] OR;  Service: Orthopedics;  Laterality: Left;  Marland Kitchen Green light laser turp (transurethral resection of prostate N/A 10/02/2014    Procedure: GREEN LIGHT LASER photo evaporization of prostate;  Surgeon: Jerilee Field, MD;  Location: [facility] ORS;  Service: Urology;  Laterality: N/A;   HPI:  81 y.o. male with a past medical history of CVA (12/13), chronic atrial fibrillation, chronic kidney disease, multiple sclerosis who is bedbound admitted from nursing home due to edema and increase in tiredness. Found to have sepsios due  to UTI. MD suspects pt aspirating his secretions. CXR diffuse BILATERAL pulmonary infiltrates question edema versus infection. No prior notes from ST re: swallow function.   Assessment / Plan / Recommendation Clinical Impression  Pt appears unable to protect his airway with likely aspiration of his secretions due to wet vocal quality in addition to increased work of breathing, coughing on secretions. Oral care removed moderately dried secretions on hard palate. Ice chip x 1 provided to assist in pharyngeal lubrication. No pharyngeal swallow detected. Pt unsafe for po's at present. Recommend thorough and frequent oral care and ST will check status next date. Palliative care is following pt.      Aspiration Risk  Severe    Diet Recommendation NPO        Other  Recommendations Oral Care Recommendations: Oral care QID   Follow Up Recommendations       Frequency and Duration min 1 x/week  1 week   Pertinent Vitals/Pain none    SLP Swallow Goals     Swallow Study Prior Functional Status       General Other Pertinent Information: 81 y.o. male with a past medical history of CVA (12/13), chronic atrial fibrillation, chronic kidney disease, multiple sclerosis who is bedbound admitted from nursing home due to edema and increase in tiredness. Found to have sepsios due to UTI. MD suspects pt aspirating his secretions. CXR diffuse BILATERAL pulmonary infiltrates question edema versus infection. No prior notes  from ST re: swallow function. Previous Swallow Assessment:  (none) Respiratory Status: Supplemental O2 delivered via (comment) History of Recent Intubation: No Behavior/Cognition: Requires cueing;Lethargic/Drowsy Oral Cavity - Dentition: Adequate natural dentition/normal for age Baseline Vocal Quality: Wet Volitional Cough: Weak Volitional Swallow:  (N/A)    Oral/Motor/Sensory Function Overall Oral Motor/Sensory Function:  (pt unable)   Ice Chips Ice chips: Impaired Oral Phase  Impairments: Reduced labial seal;Reduced lingual movement/coordination Pharyngeal Phase Impairments:  (no swallow initiated)   Thin Liquid Thin Liquid: Not tested    Nectar Thick Nectar Thick Liquid: Not tested   Honey Thick Honey Thick Liquid: Not tested   Puree Puree: Not tested   Solid   GO    Solid: Not tested       Royce Macadamia 04/25/2015,7:45 AM  Breck Coons Lonell Face.Ed [employer] [phone]

## 2015-04-25 NOTE — Plan of Care (Signed)
Problem: Discharge Progression Outcomes Goal: Barriers To Progression Addressed/Resolved Outcome: Not Applicable Date Met:  39/43/20 Patient returning to [facility]

## 2015-04-25 NOTE — Clinical Social Work Note (Signed)
Clinical Social Work Assessment  Patient Details  Name: Erik Arnold MRN: [id number] Date of Birth: Feb 27, 1924  Date of referral:  04/25/15               Reason for consult:  Facility Placement                Permission sought to share information with:  Oceanographer granted to share information::  Yes, Verbal Permission Granted (Permission granted from pt sister)  Name::        Agency::  [facility]  Relationship::     Contact Information:     Housing/Transportation Living arrangements for the past 2 months:  Skilled Building surveyor of Information:   (Sister) Patient Interpreter Needed:  None Criminal Activity/Legal Involvement Pertinent to Current Situation/Hospitalization:  No - Comment as needed Significant Relationships:  Siblings Lives with:  Facility Resident Do you feel safe going back to the place where you live?  Yes Need for family participation in patient care:  Yes (Comment)  Care giving concerns:  Pt admitted from SNF and family has decided for pt to return with hospice   Social Worker assessment / plan:  CSW spoke with pt sister and confirmed plan. Pt sister very thankful for all the [facility] Team has done. She expressed that she has had all questions answered and staff has made this decision and time easier for her. CSW briefly explained SNF with hospice for payment standpoint and informed pt sister to direct any other questions to facility staff who will be able to provide more in depth explanation if needed.    CSW prepared pt dc packet and placed with shadow chart. CSW arranged non-emergent ambulance transport. Pt family, pt nurse, and facility informed. CSW signing off.   Employment status:  Retired Health and safety inspector:  Medicare PT Recommendations:  Not assessed at this time Information / Referral to community resources:   (SNF with Hospice)  Patient/Family's Response to care:  Pt sister in agreement that  returning to SNF with hospice would be what pt would want.  Patient/Family's Understanding of and Emotional Response to Diagnosis, Current Treatment, and Prognosis:  Pt sister has a good understanding of pt condition after speaking with medical team. Pt sister did express that this is a difficult time but is coping appropriately.  Emotional Assessment Appearance:    Attitude/Demeanor/Rapport:  Unable to Assess Affect (typically observed):    Orientation:  Oriented to Self, Oriented to  Time, Oriented to Place, Oriented to Situation Alcohol / Substance use:  Not Applicable Psych involvement (Current and /or in the community):  No (Comment)  Discharge Needs  Concerns to be addressed:  Discharge Planning Concerns, Grief and Loss Concerns Readmission within the last 30 days:  No Current discharge risk:  Terminally ill Barriers to Discharge:  No Barriers Identified   Irva Loser, LCSWA [phone]

## 2015-04-25 NOTE — Discharge Summary (Signed)
PATIENT DETAILS Name: Erik Arnold Age: 81 y.o. Sex: male Date of Birth: 23-Jul-1924 MRN: [id number]. Admitting Physician: Bobette Mo, MD WUJ:[id number],GNFAOZ G, MD  Admit Date: 04/23/2015 Discharge date: 04/25/2015  Recommendations for Outpatient Follow-up:  1. Full comfort measures 2. Please make sure hospice follow-up at SNF.   PRIMARY DISCHARGE DIAGNOSIS:  Principal Problem:   Sepsis secondary to UTI Active Problems:   Chronic atrial fibrillation   CHF exacerbation   Anasarca   Hypothermia   MS (multiple sclerosis)      PAST MEDICAL HISTORY: Past Medical History  Diagnosis Date  . Stroke 08/22/2012    residual left sided weakness (08/23/2012)  . TIA (transient ischemic attack)   . Chronic atrial fibrillation     Not on coumadin as he was deemed a fall risk after he fell in his cardiologists office.  Marland Kitchen Dysrhythmia     Atrial Fib  . Carpal tunnel syndrome     diagnosed in 1997  . Chronic kidney disease     Urinary retention  . DNR (do not resuscitate)   . BPH (benign prostatic hypertrophy)   . Left-sided weakness     "since the 1980's"  . DDD (degenerative disc disease), cervical   . Spinal stenosis of lumbar region   . MS (multiple sclerosis)   . Unable to ambulate     DISCHARGE MEDICATIONS: Current Discharge Medication List    START taking these medications   Details  albuterol (PROVENTIL) (2.5 MG/3ML) 0.083% nebulizer solution Take 3 mLs (2.5 mg total) by nebulization every 2 (two) hours as needed for wheezing or shortness of breath.    atropine 1 % ophthalmic solution Place 2 drops under the tongue 4 (four) times daily.    ipratropium-albuterol (DUONEB) 0.5-2.5 (3) MG/3ML SOLN Take 3 mLs by nebulization 3 (three) times daily.    LORazepam (ATIVAN) 2 MG/ML concentrated solution Take 0.5 mLs (1 mg total) by mouth every 4 (four) hours as needed for anxiety, sedation or sleep. Qty: 30 mL, Refills: 0    Morphine Sulfate (MORPHINE CONCENTRATE) 10  MG/0.5ML SOLN concentrated solution Take 0.25 mLs (5 mg total) by mouth every 2 (two) hours as needed for severe pain or shortness of breath (dyspnea, tachypnea). Qty: 30 mL, Refills: 0    scopolamine (TRANSDERM-SCOP) 1 MG/3DAYS Place 1 patch (1.5 mg total) onto the skin every 3 (three) days.      CONTINUE these medications which have NOT CHANGED   Details  baclofen (LIORESAL) 20 MG tablet Take 20 mg by mouth at bedtime.    furosemide (LASIX) 40 MG tablet Take 40 mg by mouth daily.      STOP taking these medications     Amino Acids-Protein Hydrolys (FEEDING SUPPLEMENT, PRO-STAT SUGAR FREE 64,) LIQD      bisacodyl (DULCOLAX) 5 MG EC tablet      docusate sodium 100 MG CAPS      finasteride (PROSCAR) 5 MG tablet      HYDROcodone-acetaminophen (NORCO/VICODIN) 5-325 MG per tablet      iron polysaccharides (NIFEREX) 150 MG capsule      losartan (COZAAR) 50 MG tablet      metoprolol tartrate (LOPRESSOR) 25 MG tablet      polyethylene glycol (MIRALAX / GLYCOLAX) packet      rOPINIRole (REQUIP) 0.5 MG tablet      senna (SENOKOT) 8.6 MG TABS tablet      tamsulosin (FLOMAX) 0.4 MG CAPS capsule      warfarin (COUMADIN) 1 MG tablet  warfarin (COUMADIN) 1 MG tablet         ALLERGIES:  No Known Allergies  BRIEF HPI:  See H&P, Labs, Consult and Test reports for all details in brief, patient is a 81 year old male with history of chronic atrial fibrillation, CVA, chronic kidney disease, multiple sclerosis-who was referred from the skilled nursing facility for evaluation of anasarca. Patient was found to have UTI and some concern for aspiration pneumonia. Patient was then admitted to the hospital for further evaluation and treatment  CONSULTATIONS:   Palliative Care  PERTINENT RADIOLOGIC STUDIES: Dg Chest Portable 1 View  04/23/2015   CLINICAL DATA:  Swelling, history TIA, atrial fibrillation, chronic kidney disease, multiple sclerosis, former smoker  EXAM: PORTABLE CHEST -  1 VIEW  COMPARISON:  Portable exam 1740 hours compared to 05/30/2014  FINDINGS: Enlargement of cardiac silhouette.  Rotated to the RIGHT, which may account for RIGHT hilar prominence.  Diffuse BILATERAL pulmonary infiltrates question edema versus infection.  No gross pleural effusion or pneumothorax.  Bones demineralized.  IMPRESSION: Enlargement of cardiac silhouette with pulmonary vascular congestion.  Diffuse BILATERAL pulmonary infiltrates question edema versus infection.   Electronically Signed   By: Ulyses Southward M.D.   On: 04/23/2015 17:44     PERTINENT LAB RESULTS: CBC:  Recent Labs  04/23/15 1714 04/25/15 0355  WBC 8.9 6.6  HGB 11.7* 10.9*  HCT 35.8* 34.0*  PLT 110* 118*   CMET CMP     Component Value Date/Time   NA 144 04/25/2015 0355   K 4.6 04/25/2015 0355   CL 110 04/25/2015 0355   CO2 26 04/25/2015 0355   GLUCOSE 94 04/25/2015 0355   BUN 47* 04/25/2015 0355   CREATININE 1.56* 04/25/2015 0355   CALCIUM 8.7* 04/25/2015 0355   PROT 6.8 04/25/2015 0355   PROT 6.0 08/06/2014 1025   ALBUMIN 2.7* 04/25/2015 0355   AST 19 04/25/2015 0355   ALT 23 04/25/2015 0355   ALKPHOS 114 04/25/2015 0355   BILITOT 1.0 04/25/2015 0355   GFRNONAA 38* 04/25/2015 0355   GFRAA 44* 04/25/2015 0355    GFR Estimated Creatinine Clearance: 36.6 mL/min (by C-G formula based on Cr of 1.56). No results for input(s): LIPASE, AMYLASE in the last 72 hours. No results for input(s): CKTOTAL, CKMB, CKMBINDEX, TROPONINI in the last 72 hours. Invalid input(s): POCBNP No results for input(s): DDIMER in the last 72 hours. No results for input(s): HGBA1C in the last 72 hours. No results for input(s): CHOL, HDL, LDLCALC, TRIG, CHOLHDL, LDLDIRECT in the last 72 hours. No results for input(s): TSH, T4TOTAL, T3FREE, THYROIDAB in the last 72 hours.  Invalid input(s): FREET3 No results for input(s): VITAMINB12, FOLATE, FERRITIN, TIBC, IRON, RETICCTPCT in the last 72 hours. Coags:  Recent Labs   04/24/15 0730 04/25/15 0355  INR 3.29* 3.37*   Microbiology: Recent Results (from the past 240 hour(s))  Urine culture     Status: None (Preliminary result)   Collection Time: 04/23/15  5:31 PM  Result Value Ref Range Status   Specimen Description URINE, RANDOM  Final   Special Requests NONE  Final   Culture >=100,000 COLONIES/mL ESCHERICHIA COLI  Final   Report Status PENDING  Incomplete  MRSA PCR Screening     Status: None   Collection Time: 04/23/15  9:34 PM  Result Value Ref Range Status   MRSA by PCR NEGATIVE NEGATIVE Final    Comment:        The GeneXpert MRSA Assay (FDA approved for NASAL specimens only), is  one component of a comprehensive MRSA colonization surveillance program. It is not intended to diagnose MRSA infection nor to guide or monitor treatment for MRSA infections.      BRIEF HOSPITAL COURSE:  Sepsis: secondary to UTI and aspiration pneumonia.Less encephalopathic compared to on admission. Patient was placed  continue vancomycin and Zosyn. Given overall poor prognoses, after speaking with family-we have now transitioned to full comfort measures. All antibiotics have been discontinued. Urine culture results are still pending at this time..  Active Problems: Aspiration pneumonia: Seems to be overtly aspirating his secretions-appears to have significant pooling of his secretions. Continues to be nothing by mouth, but since we are now transitioning to comfort measures-start comfort feedings. As noted above, was empirically on vancomycin and Zosyn. After speaking with family, being transferred back to SNF for full comfort measures. Family okay with starting comfort feedings as tolerated.  UTI: Was on empiric Zosyn, urine culture still pending. Since transitioning to comfort measures-off all antibiotics.  Encephalopathy: Secondary to sepsis/UTI and pneumonia. Improved, more awake/alert compared to admission.  Acute diastolic heart failure: Improved with less  edema. During this hospital stay, blood pressure was soft to begin aggressive diuresis, hence patient was started on  Midodrine, family did not want aggressive measures-hence we did not pursue audiology consultation. After speaking with palliative care team, family has decided to transition to full comfort measures. Continue Lasix for comfort. Volume status has improved but still has some excess volume on board.  Anasarca: Likely secondary to above, but suspect hypoalbuminemia also playing a role.  Acute renal failure: Suspect prerenal azotemia, and a setting of sepsis- Unfortunately-complicated by development of congestive heart failure. No further blood work-being transitioned from to full comfort measures  Thrombocytopenia: Likely secondary to sepsis.   Atrial fibrillation: Rate controlled, previously on Coumadin-since being transitioned to comfort measures. Will discontinue Coumadin on discharge. Furthermore reviewed outpatient cardiology note (06/20/14)-apparently was not on Coumadin due to fall risk-unclear when it was restarted.   History of multiple sclerosis: Not on any disease modifying agents.   BPH: Continue  Foley for comfort.  Palliative care: Very poor overall prognoses-bed bound patient with multiple sclerosis, presenting with severe sepsis probably from aspiration pneumonia and UTI. Also with decompensated diastolic heart failure. DO NOT RESUSCITATE was already in place, after speaking with family and further discussion with the palliative care team, recommendations are to transition to full comfort measures and transfer back to SNF today. Family okay with comfort feedings-day accept on aspiration risk.  TODAY-DAY OF DISCHARGE:  Subjective:   Alfard Cochrane today is much more awake and alert, but still seems to be pooling secretions.  Objective:   Blood pressure 100/44, pulse 94, temperature 97.3 F (36.3 C), temperature source Axillary, resp. rate 24, height 5\' 8"  (1.727 m),  weight 98.7 kg (217 lb 9.5 oz), SpO2 98 %.  Intake/Output Summary (Last 24 hours) at 04/25/15 0955 Last data filed at 04/25/15 0900  Gross per 24 hour  Intake    250 ml  Output   3320 ml  Net  -3070 ml   Filed Weights   04/23/15 1913 04/23/15 2120 04/25/15 0305  Weight: 90.719 kg (200 lb) 103.7 kg (228 lb 9.9 oz) 98.7 kg (217 lb 9.5 oz)    Exam Awake Alert-not in any distress. [place].AT,PERRAL Supple Neck  Symmetrical Chest wall movement, Good air movement bilaterally, CTAB RRR,No Gallops,Rubs or new Murmurs, No Parasternal Heave +ve B.Sounds, Abd Soft, Non tender, No organomegaly appriciated, No rebound -guarding or rigidity. No  Cyanosis, Clubbing No new Rash or bruise  DISCHARGE CONDITION: Stable  DISPOSITION: SNF with hospice  DISCHARGE INSTRUCTIONS:    Activity:  As tolerated   Diet recommendation: Mostly NPO-but OK for comfort feedings if desired   Follow-up Information    Follow up with Garlan Fillers, MD. Schedule an appointment as soon as possible for a visit in 2 days.   Specialty:  Internal Medicine   Contact information:   [address] [place] [address] [phone]       Total Time spent on discharge equals 45 minutes.  SignedJeoffrey Massed 04/25/2015 9:55 AM

## 2015-04-25 NOTE — Evaluation (Signed)
Physical Therapy Evaluation Patient Details Name: Erik Arnold MRN: [id number] DOB: 1924/07/21 Today's Date: 04/25/2015   History of Present Illness  81 y.o. male with a past medical history of CVA, chronic atrial fibrillation, chronic kidney disease, multiple sclerosis who is relatively bedbound and is being referred from [facility] due to edema and increase in tiredness  Clinical Impression  On arrival pt lethargic with gurgling respirations in bed. Pt able to follow commands for movement of all extremities although slow and limited supine. However, once sitting EOB pt more alert, improved respirations and able to engage and interact more purposefully. Pt not oriented to date but aware of self and place. Pt will benefit from acute therapy to maximize mobility, transfers, function and strength to decrease burden of care. Recommend OOB daily with nursing staff.     Follow Up Recommendations SNF;Supervision/Assistance - 24 hour    Equipment Recommendations  None recommended by PT    Recommendations for Other Services       Precautions / Restrictions Precautions Precautions: Fall Restrictions Weight Bearing Restrictions: No      Mobility  Bed Mobility Overal bed mobility: Needs Assistance Bed Mobility: Rolling;Sidelying to Sit Rolling: Max assist Sidelying to sit: Mod assist       General bed mobility comments: max multimodal cues to sequence transfers and movement with assist to rotate pelvis and reach for rail, assist to elevate trunk and bring legs off of bed  Transfers Overall transfer level: Needs assistance   Transfers: Sit to/from Stand;Stand Pivot Transfers Sit to Stand: Mod assist;+2 physical assistance;From elevated surface Stand pivot transfers: Mod assist;+2 physical assistance       General transfer comment: cues for sequence, hand placement, anterior translation and assist for elevation from raised surface  Ambulation/Gait                Stairs             Wheelchair Mobility    Modified Rankin (Stroke Patients Only)       Balance Overall balance assessment: Needs assistance   Sitting balance-Leahy Scale: Fair       Standing balance-Leahy Scale: Poor                               Pertinent Vitals/Pain Pain Assessment: No/denies pain  HR 94 sats 98% on 3L    Home Living Family/patient expects to be discharged to:: Skilled nursing facility                 Additional Comments: plans to return to [facility]     Prior Function Level of Independence: Needs assistance   Gait / Transfers Assistance Needed: 2 person assist to stand pivot, rolls mod I, supine to sit min assist, working with P.T.   ADL's / Homemaking Assistance Needed: mod assist for bathing and dressing, self feeds  Comments: PLOF from staff at [facility]     Hand Dominance        Extremity/Trunk Assessment   Upper Extremity Assessment: Defer to OT evaluation           Lower Extremity Assessment: Generalized weakness      Cervical / Trunk Assessment: Normal  Communication   Communication: HOH  Cognition Arousal/Alertness: Awake/alert Behavior During Therapy: Flat affect Overall Cognitive Status: Impaired/Different from baseline Area of Impairment: Orientation;Following commands;Safety/judgement;Attention;Problem solving Orientation Level: Disoriented to;Time;Situation Current Attention Level: Sustained Memory: Decreased short-term memory Following Commands: Follows one step commands  inconsistently;Follows one step commands with increased time Safety/Judgement: Decreased awareness of deficits;Decreased awareness of safety   Problem Solving: Slow processing;Decreased initiation;Difficulty sequencing;Requires verbal cues;Requires tactile cues      General Comments      Exercises General Exercises - Lower Extremity Short Arc Quad: AROM;Seated;Both;5 reps Hip Flexion/Marching: AROM;AAROM;Seated;Right;Left;5 reps  (AAROM on LLE)      Assessment/Plan    PT Assessment Patient needs continued PT services  PT Diagnosis Difficulty walking;Generalized weakness;Altered mental status   PT Problem List Decreased strength;Decreased cognition;Decreased activity tolerance;Decreased balance;Decreased safety awareness;Decreased mobility  PT Treatment Interventions DME instruction;Functional mobility training;Therapeutic activities;Cognitive remediation;Therapeutic exercise;Patient/family education;Balance training   PT Goals (Current goals can be found in the Care Plan section) Acute Rehab PT Goals PT Goal Formulation: Patient unable to participate in goal setting Time For Goal Achievement: 05/09/15 Potential to Achieve Goals: Fair    Frequency Min 2X/week   Barriers to discharge        Co-evaluation               End of Session Equipment Utilized During Treatment: Gait belt Activity Tolerance: Patient tolerated treatment well Patient left: in chair;with call bell/phone within reach;with chair alarm set;with nursing/sitter in room Nurse Communication: Mobility status;Precautions         Time: 1610-9604 PT Time Calculation (min) (ACUTE ONLY): 18 min   Charges:   PT Evaluation $Initial PT Evaluation Tier I: 1 Procedure     PT G CodesDelorse Lek 04/25/2015, 9:44 AM Delaney Meigs, PT [phone]

## 2015-05-02 DEATH — deceased

## 2015-08-16 IMAGING — CR DG CHEST 1V
1 series · 1 of 1 positions shown · non-contrast
Comparison: None.

CLINICAL DATA: Left hip fracture following a fall.

EXAM:
CHEST - 1 VIEW

[t chest supine]
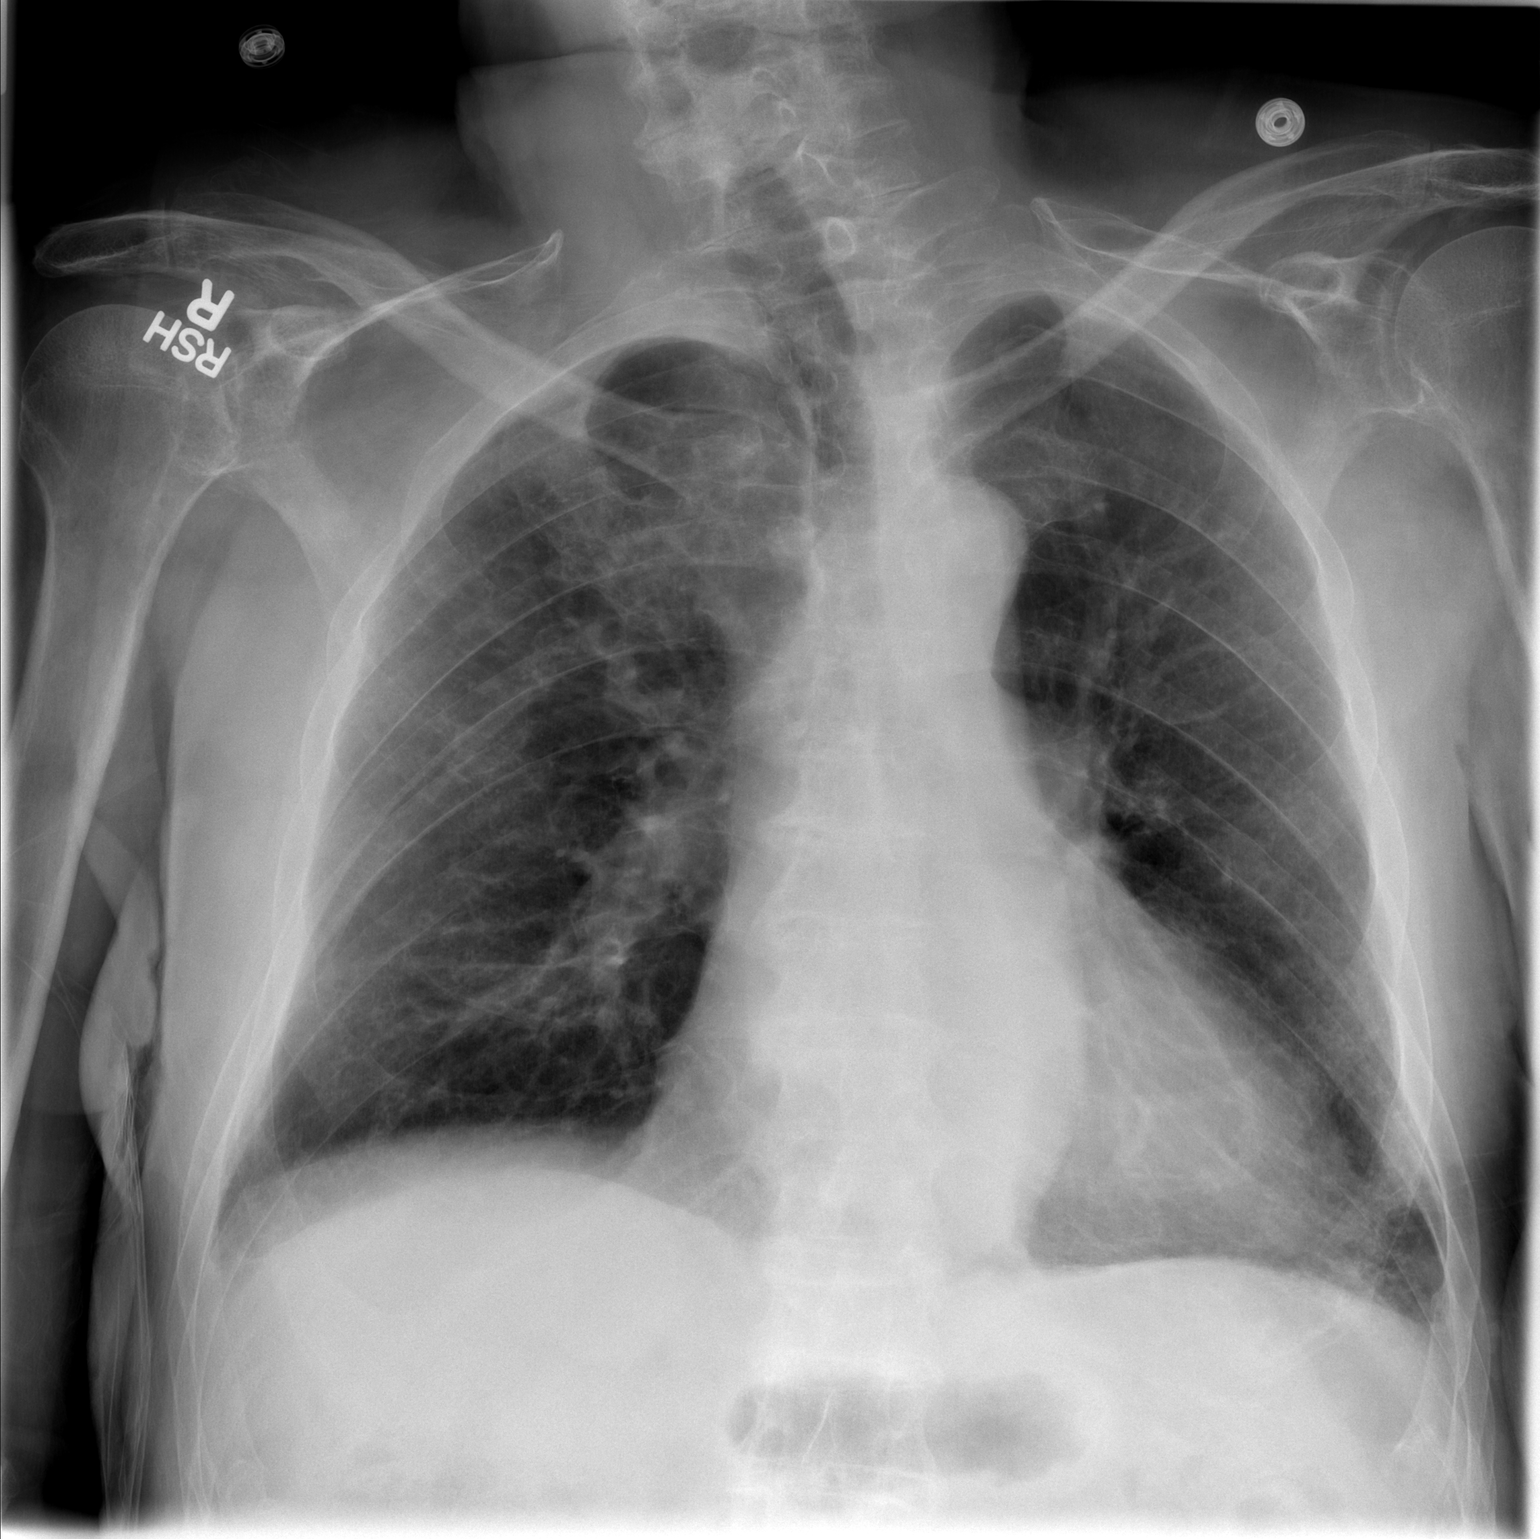

[1 of 1 positions shown; findings below may reference images not displayed]

FINDINGS: Borderline enlarged cardiac silhouette. Tortuous aorta. Mildly
prominent interstitial markings with minimal somewhat patchy density
at the left lateral lung base. Old, healed left inferior rib
fractures laterally. No acute fracture or pneumothorax. Thoracic
spine and cervical spine degenerative changes.
IMPRESSION: Borderline cardiomegaly, mild chronic interstitial lung disease and
small amount atelectasis or scarring at the left lung base.

## 2015-08-16 IMAGING — CR DG FEMUR 2V*L*
4 series · 4 of 4 positions shown · non-contrast
Comparison: None.

CLINICAL DATA: Fall with left hip fracture.

EXAM:
LEFT FEMUR - 2 VIEW

[t femur with hip  ap left]
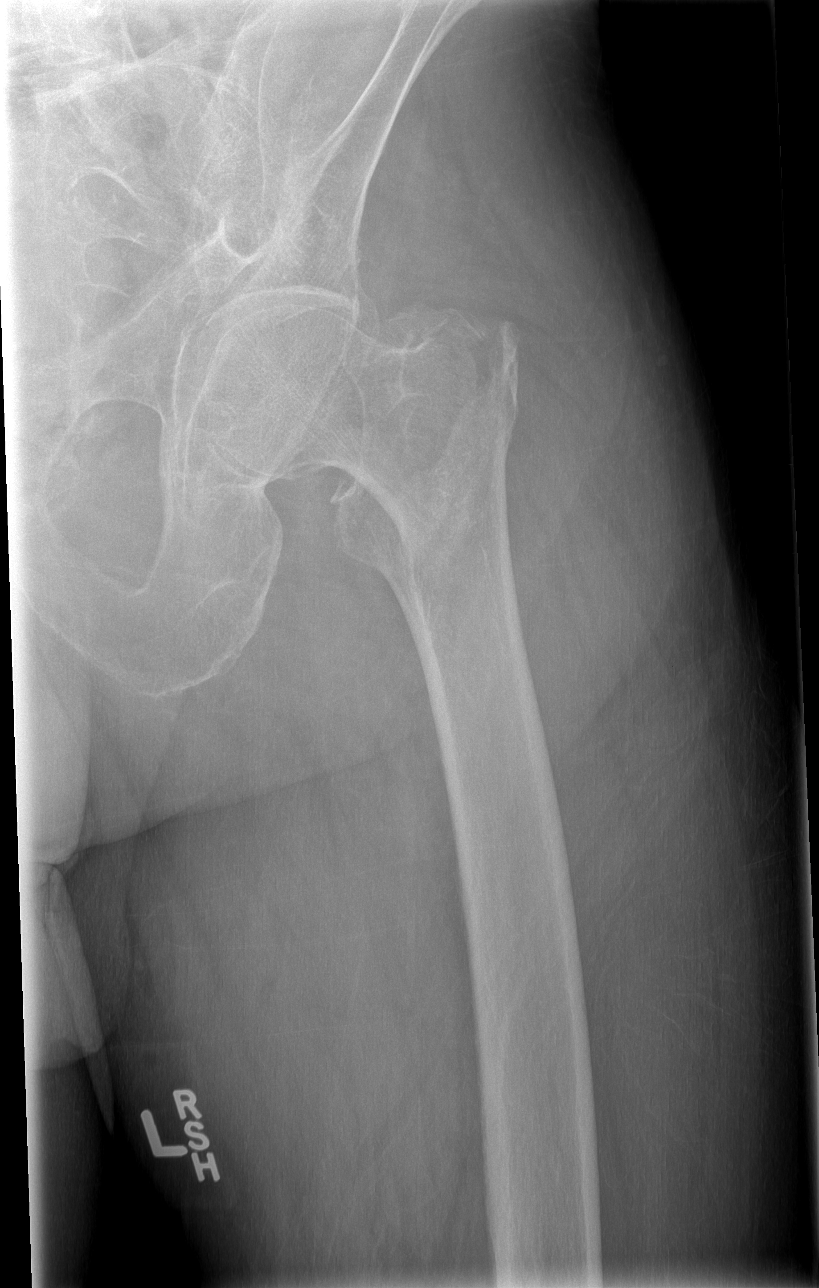

[t femur with knee ap left (1 of 2)]
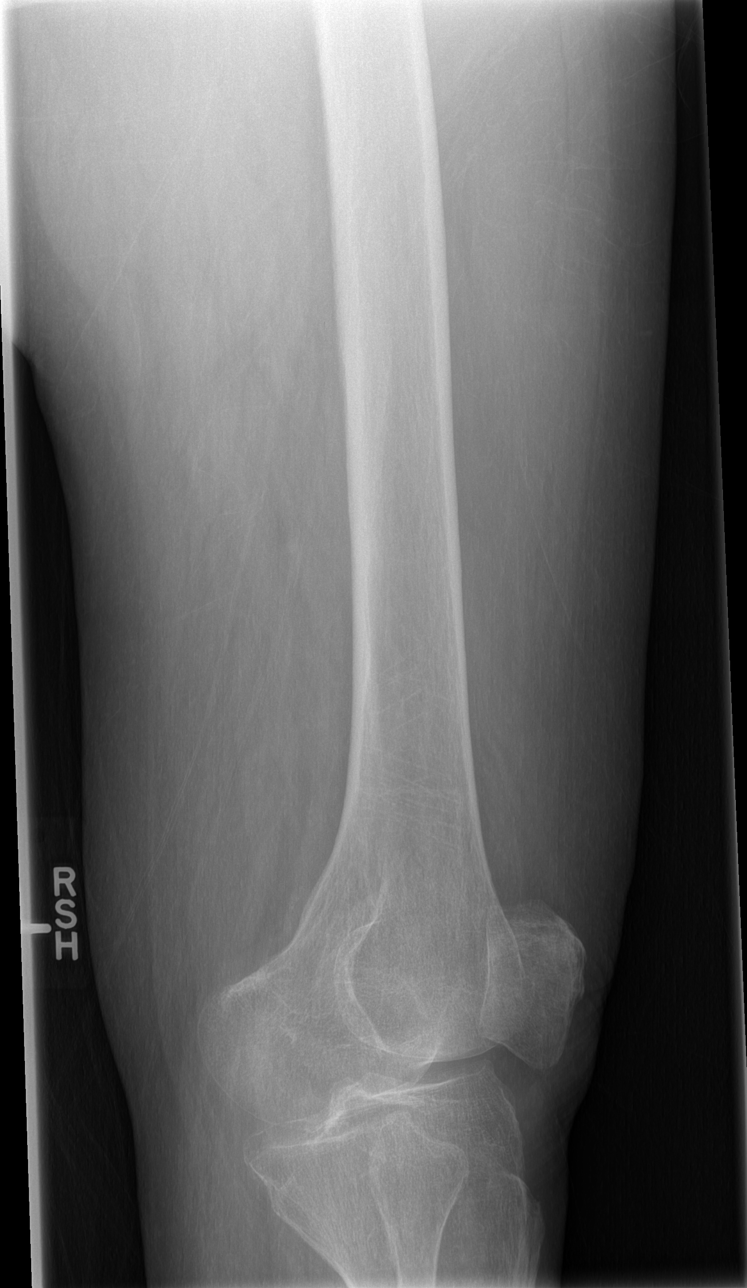

[t femur with knee ap left (2 of 2)]
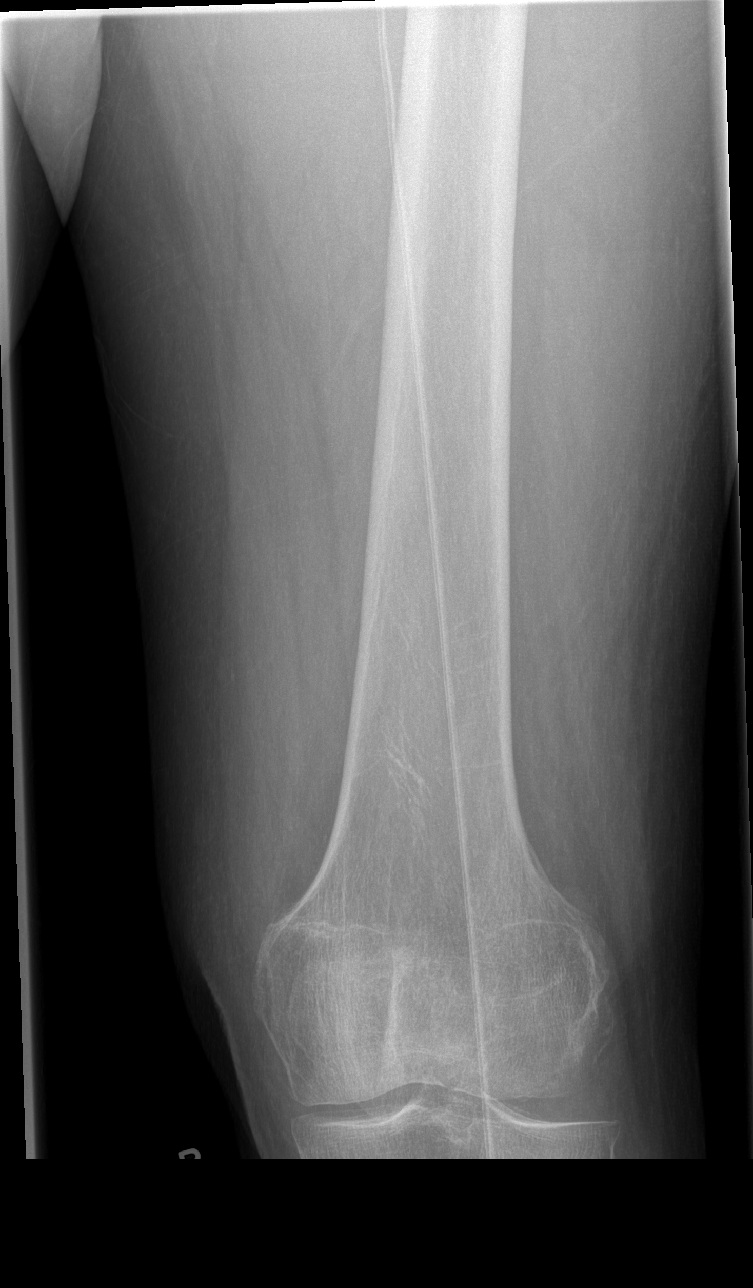

[t femur with hip lat left]
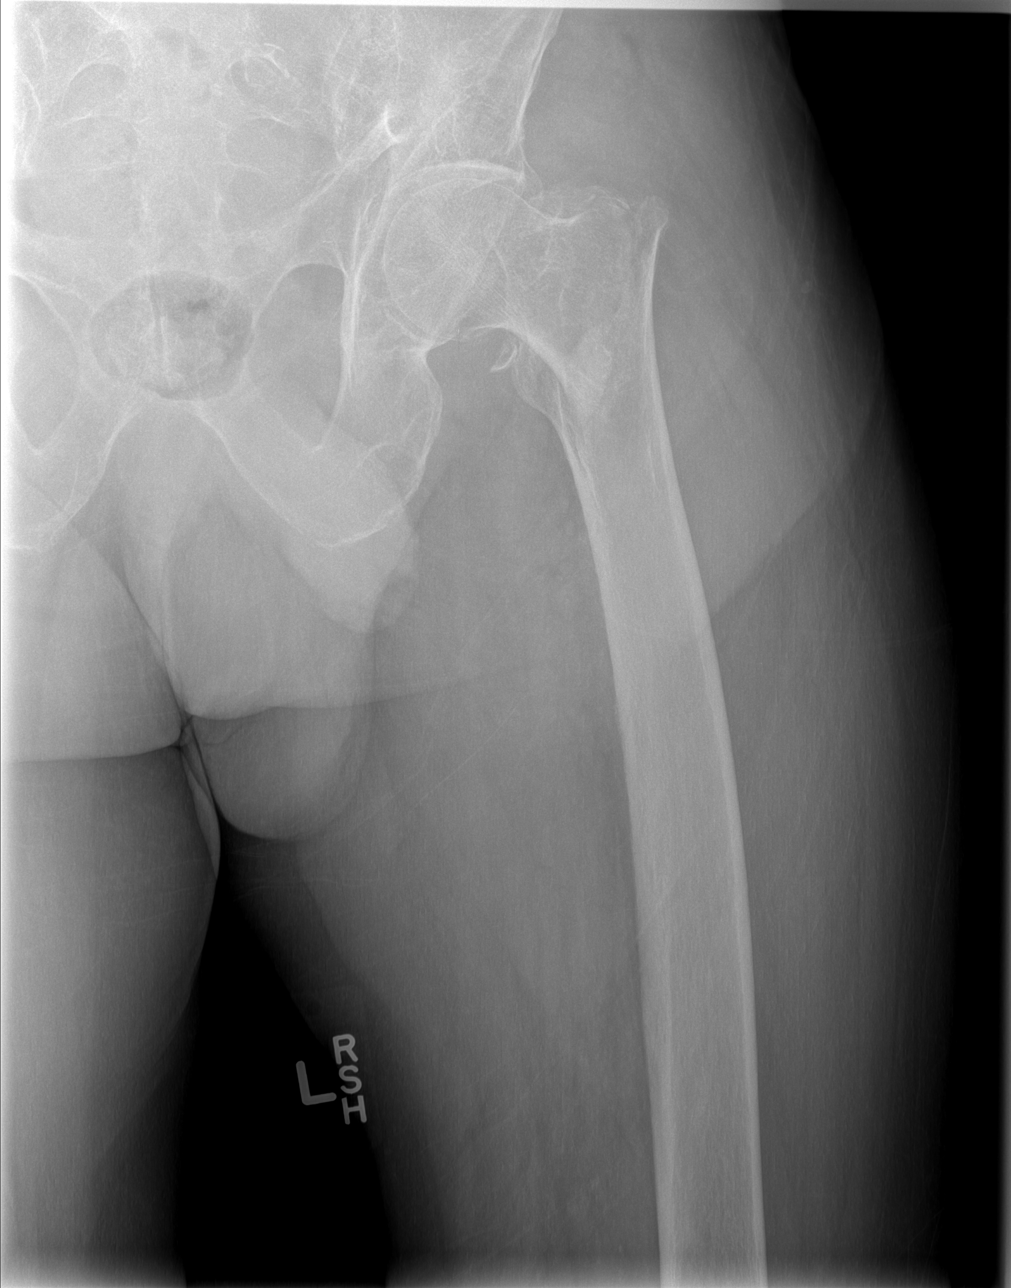

[4 of 4 positions shown; findings below may reference images not displayed]

FINDINGS: There is an intertrochanteric fracture of the left femur with varus
angulation. No dislocation. Bones appear demineralized. Left
obturator ring appears grossly intact. Incidental imaging of the
left knee reveals mild patellofemoral osteophytosis.
IMPRESSION: Left intertrochanteric femur fracture.  Osteopenia.

## 2015-08-17 IMAGING — CR DG PORTABLE PELVIS
1 series · 1 of 1 positions shown · non-contrast
Comparison: 05/21/2014.

CLINICAL DATA: Postop.

EXAM:
PORTABLE PELVIS 1-2 VIEWS

[AP]
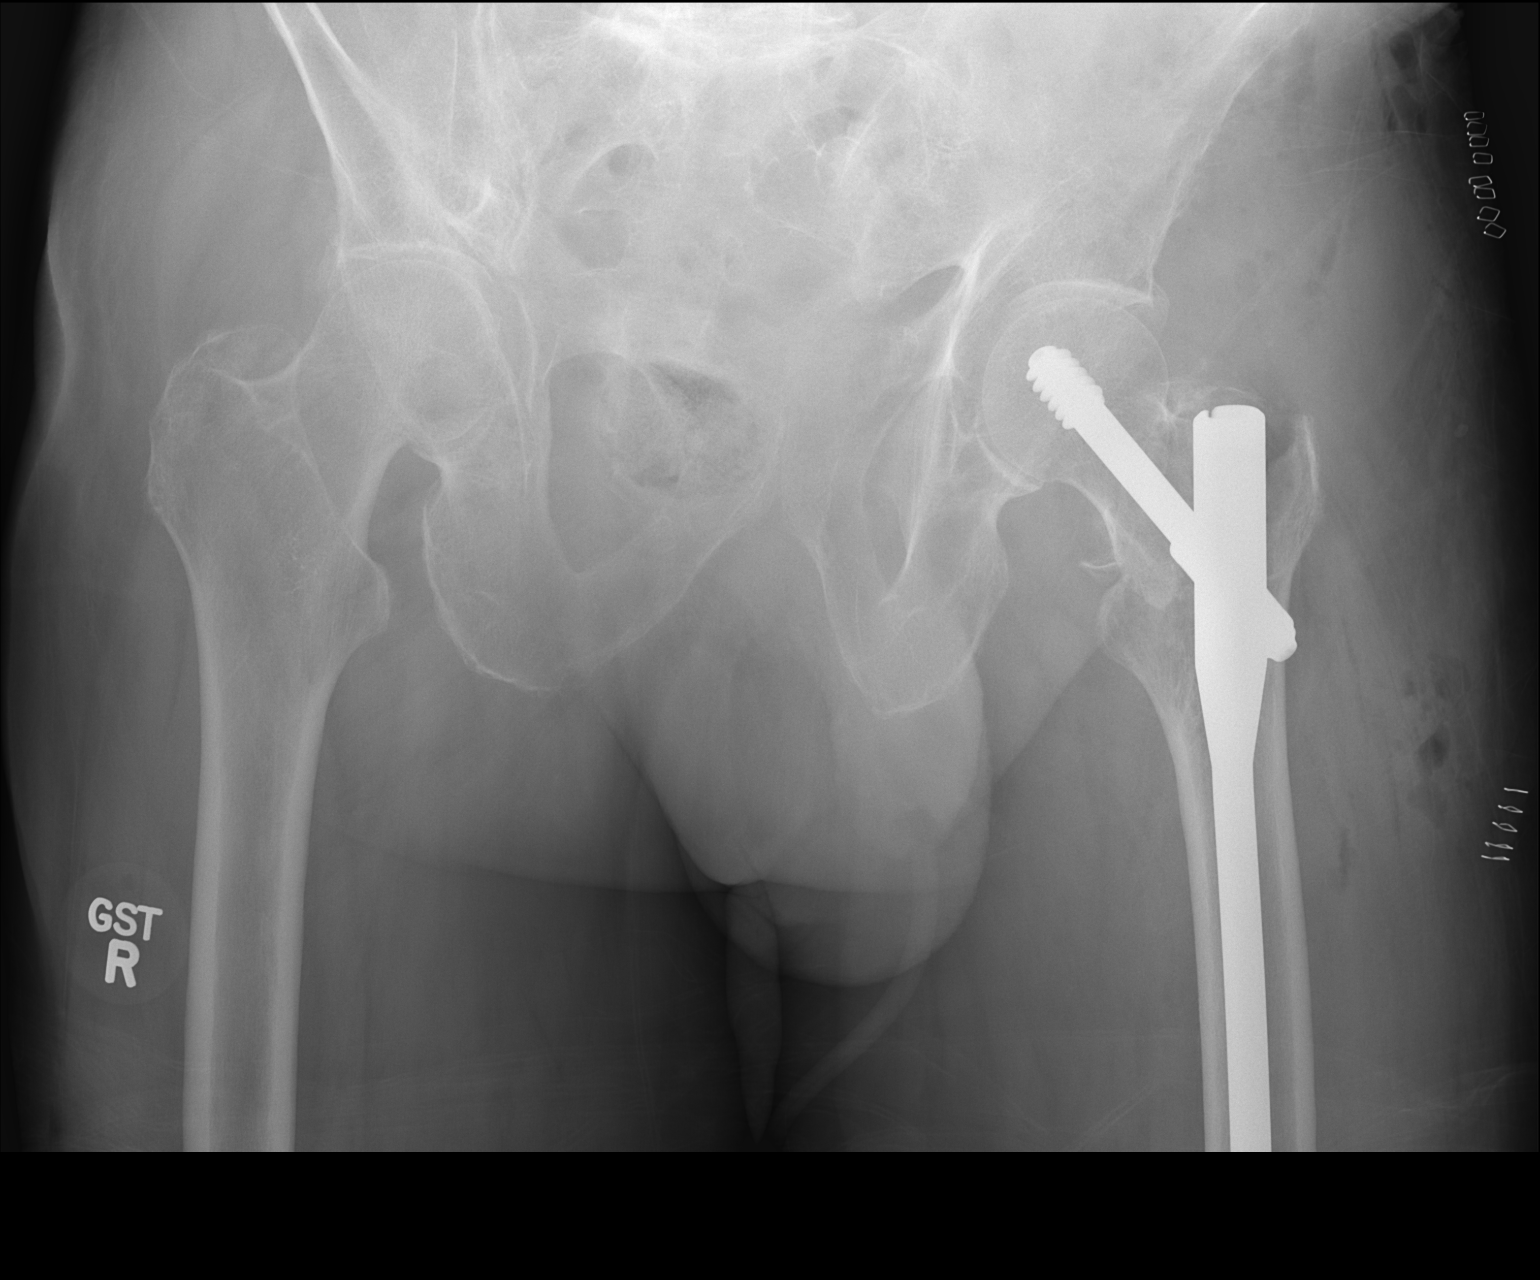

[1 of 1 positions shown; findings below may reference images not displayed]

FINDINGS: Status post ORIF of a left femur intertrochanteric fracture. Mildly
impacted appearance of the medial fracture compared with
intraoperative fluoroscopy is likely related to rotation of the left
hip, although there could of also been some mild settling. Dynamic
hip screw and intra medullary nail have an unremarkable appearance.
The hip is located. No acute bony pelvis findings.
IMPRESSION: Intertrochanteric left femur fracture status post ORIF. No adverse
findings.

## 2015-08-25 IMAGING — CR DG CHEST 1V PORT
1 series · 1 of 1 positions shown · non-contrast
Comparison: Portable exam 6264 hr compared to 05/21/2014

CLINICAL DATA: Shortness of breath, stroke, atrial fibrillation

EXAM:
PORTABLE CHEST - 1 VIEW

[AP]
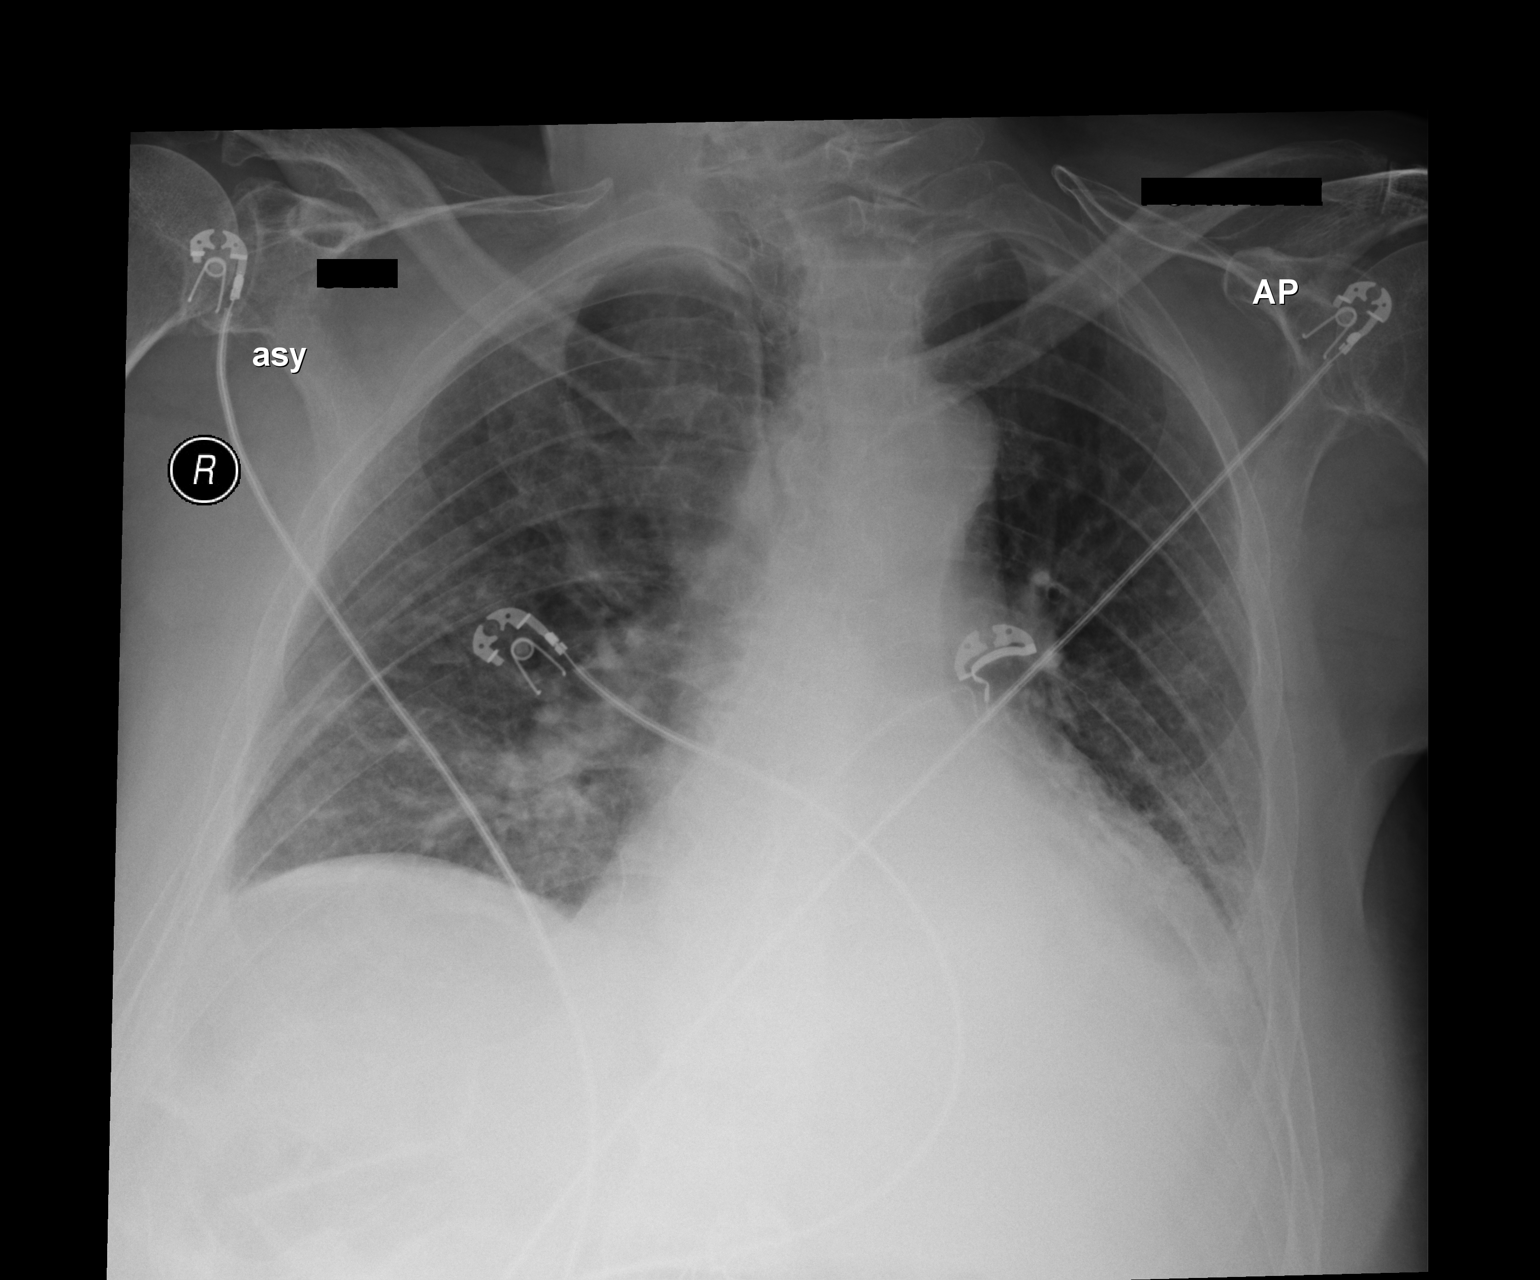

[1 of 1 positions shown; findings below may reference images not displayed]

FINDINGS: Rotated to the RIGHT.

Enlargement of cardiac silhouette with pulmonary vascular
congestion.

Mediastinal contours normal.

Bibasilar atelectasis versus infiltrate

No pleural effusion or pneumothorax.

Minimal central peribronchial thickening.

No acute osseous findings.
IMPRESSION: Enlargement of cardiac silhouette with pulmonary vascular
congestion.

Minimal bronchitic changes with bibasilar atelectasis versus
infiltrate.
